# Patient Record
Sex: Female | Born: 1960
Health system: Southern US, Community
[De-identification: ages and names within clinical notes are randomized; demographics above are authoritative.]

## PROBLEM LIST (undated history)

## (undated) DIAGNOSIS — M8580 Other specified disorders of bone density and structure, unspecified site: Secondary | ICD-10-CM

## (undated) DIAGNOSIS — R87619 Unspecified abnormal cytological findings in specimens from cervix uteri: Secondary | ICD-10-CM

## (undated) DIAGNOSIS — F32A Depression, unspecified: Secondary | ICD-10-CM

## (undated) DIAGNOSIS — F329 Major depressive disorder, single episode, unspecified: Secondary | ICD-10-CM

## (undated) DIAGNOSIS — N809 Endometriosis, unspecified: Secondary | ICD-10-CM

## (undated) DIAGNOSIS — R7303 Prediabetes: Secondary | ICD-10-CM

## (undated) DIAGNOSIS — K219 Gastro-esophageal reflux disease without esophagitis: Secondary | ICD-10-CM

## (undated) DIAGNOSIS — T7840XA Allergy, unspecified, initial encounter: Secondary | ICD-10-CM

## (undated) DIAGNOSIS — E785 Hyperlipidemia, unspecified: Secondary | ICD-10-CM

## (undated) DIAGNOSIS — E669 Obesity, unspecified: Secondary | ICD-10-CM

## (undated) DIAGNOSIS — K649 Unspecified hemorrhoids: Secondary | ICD-10-CM

## (undated) DIAGNOSIS — K5909 Other constipation: Secondary | ICD-10-CM

## (undated) DIAGNOSIS — N939 Abnormal uterine and vaginal bleeding, unspecified: Secondary | ICD-10-CM

## (undated) HISTORY — DX: Unspecified hemorrhoids: K64.9

## (undated) HISTORY — DX: Prediabetes: R73.03

## (undated) HISTORY — PX: COLONOSCOPY: SHX174

## (undated) HISTORY — DX: Hyperlipidemia, unspecified: E78.5

## (undated) HISTORY — DX: Other specified disorders of bone density and structure, unspecified site: M85.80

## (undated) HISTORY — DX: Endometriosis, unspecified: N80.9

## (undated) HISTORY — DX: Allergy, unspecified, initial encounter: T78.40XA

## (undated) HISTORY — DX: Other constipation: K59.09

## (undated) HISTORY — DX: Obesity, unspecified: E66.9

## (undated) HISTORY — DX: Depression, unspecified: F32.A

## (undated) HISTORY — DX: Major depressive disorder, single episode, unspecified: F32.9

## (undated) HISTORY — PX: REDUCTION MAMMAPLASTY: SUR839

## (undated) HISTORY — DX: Unspecified abnormal cytological findings in specimens from cervix uteri: R87.619

## (undated) HISTORY — PX: GYNECOLOGIC CRYOSURGERY: SHX857

## (undated) HISTORY — DX: Gastro-esophageal reflux disease without esophagitis: K21.9

## (undated) HISTORY — DX: Abnormal uterine and vaginal bleeding, unspecified: N93.9

---
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
## 2004-01-02 HISTORY — PX: BREAST REDUCTION SURGERY: SHX8

## 2004-01-02 HISTORY — PX: ABDOMINOPLASTY: SHX5355

## 2004-01-02 HISTORY — PX: HYSTEROSCOPY: SHX211

## 2014-01-01 HISTORY — PX: DILATION AND CURETTAGE OF UTERUS: SHX78

## 2014-07-23 ENCOUNTER — Other Ambulatory Visit: Payer: Self-pay | Admitting: Family

## 2014-07-23 DIAGNOSIS — Z1231 Encounter for screening mammogram for malignant neoplasm of breast: Secondary | ICD-10-CM

## 2015-08-27 ENCOUNTER — Other Ambulatory Visit: Payer: Self-pay | Admitting: Obstetrics and Gynecology

## 2015-08-27 DIAGNOSIS — R5381 Other malaise: Secondary | ICD-10-CM

## 2015-09-30 MED FILL — PHENTERMINE 37.5 MG TABLET: 37.5 | 30 days supply | Qty: 30 | Fill #0

## 2015-10-10 ENCOUNTER — Other Ambulatory Visit: Payer: Self-pay | Admitting: Obstetrics and Gynecology

## 2015-10-13 ENCOUNTER — Other Ambulatory Visit: Payer: Self-pay | Admitting: Obstetrics and Gynecology

## 2015-10-13 DIAGNOSIS — Z780 Asymptomatic menopausal state: Secondary | ICD-10-CM

## 2015-10-20 ENCOUNTER — Other Ambulatory Visit: Payer: Self-pay

## 2015-10-24 MED FILL — diazePAM 10 MG TABS: 10 | 1 days supply | Qty: 1 | Fill #0

## 2015-11-02 MED FILL — PHENTERMINE 37.5 MG TABLET: 37.5 | 30 days supply | Qty: 30 | Fill #0

## 2015-11-18 ENCOUNTER — Ambulatory Visit
Admission: RE | Admit: 2015-11-18 | Discharge: 2015-11-18 | Disposition: A | Discharge status: Home / Self Care | Payer: 59 | Source: Ambulatory Visit | Attending: Obstetrics and Gynecology | Admitting: Obstetrics and Gynecology

## 2015-11-18 DIAGNOSIS — M8589 Other specified disorders of bone density and structure, multiple sites: Secondary | ICD-10-CM | POA: Diagnosis not present

## 2015-11-18 DIAGNOSIS — Z780 Asymptomatic menopausal state: Secondary | ICD-10-CM

## 2015-12-05 MED FILL — PHENTERMINE 37.5 MG TABLET: 37.5 | 30 days supply | Qty: 30 | Fill #0

## 2016-01-09 MED FILL — PHENTERMINE 37.5 MG TABLET: 37.5 | 30 days supply | Qty: 30 | Fill #0

## 2016-01-30 MED FILL — TOPIRAMATE 25 MG TABLET: 25 | 30 days supply | Qty: 30 | Fill #0

## 2016-02-09 MED FILL — PHENTERMINE 37.5 MG TABLET: 37.5 | 30 days supply | Qty: 30 | Fill #0

## 2016-02-23 MED FILL — diazePAM 10 MG TABS: 10 | 1 days supply | Qty: 1 | Fill #0

## 2016-03-02 MED FILL — TOPIRAMATE 25 MG TAB: 25 | 30 days supply | Qty: 30 | Fill #0

## 2016-03-08 MED FILL — PHENTERMINE 37.5 MG TABLET: 37.5 | 30 days supply | Qty: 30 | Fill #0

## 2016-04-06 MED FILL — TOPIRAMATE 25 MG TABLET: 25 | 30 days supply | Qty: 30 | Fill #0

## 2016-04-06 MED FILL — PHENTERMINE 37.5 MG TABLET: 37.5 | 30 days supply | Qty: 30 | Fill #0

## 2016-05-10 MED FILL — PHENTERMINE 37.5 MG TABLET: 37.5 | 30 days supply | Qty: 30 | Fill #0

## 2016-05-21 MED FILL — TOPIRAMATE 25 MG TAB: 25 | 30 days supply | Qty: 30 | Fill #0

## 2016-06-06 ENCOUNTER — Ambulatory Visit: Payer: 59 | Admitting: Internal Medicine

## 2016-06-07 ENCOUNTER — Encounter: Payer: Self-pay | Admitting: Family Medicine

## 2016-06-07 ENCOUNTER — Ambulatory Visit (INDEPENDENT_AMBULATORY_CARE_PROVIDER_SITE_OTHER): Payer: 59 | Admitting: Family Medicine

## 2016-06-07 DIAGNOSIS — R059 Cough, unspecified: Secondary | ICD-10-CM

## 2016-06-07 DIAGNOSIS — R05 Cough: Secondary | ICD-10-CM | POA: Diagnosis not present

## 2016-06-07 MED ORDER — BENZONATATE 200 MG PO CAPS: 200.0000 mg | ORAL_CAPSULE | Freq: Three times a day (TID) | ORAL | Status: DC | PRN

## 2016-06-07 MED ORDER — AZITHROMYCIN 250 MG PO TABS: ORAL_TABLET | ORAL | 0 refills | Status: DC

## 2016-06-07 MED FILL — AZITHROMYCIN 250 MG TAB: 250 | 5 days supply | Qty: 6 | Fill #0

## 2016-06-07 NOTE — Patient Instructions (Signed)
Try to get some rest, drink plenty of fluids.  Try tessalon for the cough.  Start zithromax.  Update [place] as needed.  Take care.  Glad to see you.

## 2016-06-07 NOTE — Progress Notes (Signed)
Coughing for about 5 weeks.   Coughing, disrupting sleep.   Rhinorrhea: initially yes, but less now.   Congestion: occ ear pain: no sore throat: no Cough: yes, some yellowish sputum but some better this week but not resolved.  Myalgias: no  Fevers: no Taking mucinex and delsym.   She typically doesn't have troubles this time of year.  Already on OTC allergy meds.    She hasn't tried tessalon yet.    Per HPI unless specifically indicated in ROS section   Meds, vitals, and allergies reviewed.   GEN: nad, alert and oriented HEENT: mucous membranes moist, TM w/o erythema, nasal epithelium injected, OP with cobblestoning, sinuses not ttp NECK: supple w/o LA CV: rrr. PULM: ctab, no inc wob ABD: soft, +bs EXT: no edema

## 2016-06-08 ENCOUNTER — Ambulatory Visit: Payer: 59 | Admitting: Family Medicine

## 2016-06-08 DIAGNOSIS — R059 Cough, unspecified: Secondary | ICD-10-CM | POA: Insufficient documentation

## 2016-06-08 DIAGNOSIS — R05 Cough: Secondary | ICD-10-CM | POA: Insufficient documentation

## 2016-06-08 DIAGNOSIS — Z0289 Encounter for other administrative examinations: Secondary | ICD-10-CM

## 2016-06-08 NOTE — Assessment & Plan Note (Signed)
She's been coughing for about 5 weeks, recently with persistent discolored sputum. Nontoxic. Discussed with patient about options. Lungs are clear. At this point still reasonable to treat with antibiotics given sputum in the duration. Use Tessalon as needed for cough. Update me as needed. Routine cautions given on antibiotics. Update me as needed.

## 2016-06-29 MED FILL — TOPIRAMATE 25 MG TAB: 25 | 30 days supply | Qty: 30 | Fill #0

## 2016-07-09 MED FILL — PHENTERMINE 37.5 MG TABLET: 37.5 | 30 days supply | Qty: 30 | Fill #0

## 2016-08-13 ENCOUNTER — Ambulatory Visit (INDEPENDENT_AMBULATORY_CARE_PROVIDER_SITE_OTHER): Payer: 59 | Admitting: Primary Care

## 2016-08-13 ENCOUNTER — Encounter: Payer: Self-pay | Admitting: Primary Care

## 2016-08-13 VITALS — BP 112/78 | HR 74 | Temp 97.8°F | Ht 62.0 in | Wt 175.0 lb

## 2016-08-13 DIAGNOSIS — K219 Gastro-esophageal reflux disease without esophagitis: Secondary | ICD-10-CM | POA: Diagnosis not present

## 2016-08-13 DIAGNOSIS — R2241 Localized swelling, mass and lump, right lower limb: Secondary | ICD-10-CM

## 2016-08-13 DIAGNOSIS — Z01419 Encounter for gynecological examination (general) (routine) without abnormal findings: Secondary | ICD-10-CM

## 2016-08-13 DIAGNOSIS — E669 Obesity, unspecified: Secondary | ICD-10-CM | POA: Diagnosis not present

## 2016-08-13 DIAGNOSIS — E663 Overweight: Secondary | ICD-10-CM | POA: Insufficient documentation

## 2016-08-13 DIAGNOSIS — R2240 Localized swelling, mass and lump, unspecified lower limb: Secondary | ICD-10-CM | POA: Insufficient documentation

## 2016-08-13 MED FILL — PHENTERMINE 37.5 MG TABLET: 37.5 | 30 days supply | Qty: 30 | Fill #0

## 2016-08-13 MED FILL — TOPIRAMATE 25 MG TAB: 25 | 30 days supply | Qty: 30 | Fill #0

## 2016-08-13 NOTE — Patient Instructions (Signed)
You will be contacted regarding your referral to GYN.  Please let us know if you have not heard back within one week.   Start exercising. You should be getting 150 minutes of moderate intensity exercise weekly.  Ensure you are consuming 64 ounces of water daily.  It was a pleasure to meet you today! Please don't hesitate to call me with any questions. Welcome to [facility]!

## 2016-08-13 NOTE — Progress Notes (Signed)
Subjective:    Patient ID: Tina Chavez, female    DOB: July 30, 1960, 56 y.o.   MRN: [id number]  HPI  Tina Chavez is a 56 year old female who presents today to establish care and discuss the problems mentioned below. Will obtain old records. She would like to establish with a local GYN and is needing a referral.  1) GERD: Currently managed on omeprazole 10 mg. History of duodenal ulcer. Symptoms including esophageal burning and epigastric pain without medication. She has lost 60 pounds over the last 1 year.  2) Obesity: Currently managed on Phentermine and Topamax. Following with [facility]. Has lost 60 pounds in the past one year, no weight loss since November 2017. Recently started Vitamin B 12 injections to help with metabolism. She endorses a healthy diet, does not exercise.  3) Chronic Cough: Present for 6-8 weeks, treated with Azithromycin with improvement, then resolve two weeks later.  4) Foot Mass: Located to right lateral foot that she noticed 6 weeks ago. She denies pain. She has noticed tension to he right lateral ankle as she thinks she's altered her gait. She denies injury/trauma, erythema, other growths.  Review of Systems  Respiratory: Negative for cough and shortness of breath.   Cardiovascular: Negative for chest pain.  Gastrointestinal:       Controlled GERD  Musculoskeletal:       Right foot mass       Past Medical History:  Diagnosis Date  . Allergy   . Depression   . GERD (gastroesophageal reflux disease)   . Hyperlipidemia      Social History   Social History  . Marital status: Married    Spouse name: N/A  . Number of children: N/A  . Years of education: N/A   Occupational History  . Not on file.   Social History Main Topics  . Smoking status: Never Smoker  . Smokeless tobacco: Never Used  . Alcohol use Not on file  . Drug use: Unknown  . Sexual activity: Not on file   Other Topics Concern  . Not on file   Social History Narrative     Married.   Step Children.   Works as a Transport planner.   Enjoys traveling, playing with her dogs.     Past Surgical History:  Procedure Laterality Date  . BREAST REDUCTION SURGERY  2006    Family History  Problem Relation Age of Onset  . Hyperlipidemia Mother   . Stroke Mother   . Arthritis Father   . Heart disease Father   . Hyperlipidemia Maternal Grandmother   . Heart disease Maternal Grandmother   . Mental illness Maternal Grandmother   . Alcohol abuse Maternal Grandfather   . Arthritis Paternal Grandmother   . Hyperlipidemia Paternal Grandmother   . Heart disease Paternal Grandmother   . Alcohol abuse Paternal Grandfather     Allergies  Allergen Reactions  . Sulfa Antibiotics Anaphylaxis  . Pollen Extract     Current Outpatient Prescriptions on File Prior to Visit  Medication Sig Dispense Refill  . aspirin EC 81 MG tablet Take 81 mg by mouth daily.    . Calcium Carbonate-Vitamin D3 (CALCIUM 600-D) 600-400 MG-UNIT TABS Takes 2 tablets daily.    . cetirizine (ZYRTEC) 10 MG tablet Take 10 mg by mouth daily.    . Multiple Vitamin (MULTIVITAMIN) tablet Take 1 tablet by mouth daily.    . Omega-3 Fatty Acids (FISH OIL) 1000 MG CAPS 2 capsules daily.    [name]  omeprazole (PRILOSEC) 10 MG capsule Take 10 mg by mouth daily.    . phentermine 37.5 MG capsule Take 37.5 mg by mouth every morning.    . Probiotic Product (PROBIOTIC ADVANCED PO) Take by mouth daily.    [name] topiramate (TOPAMAX) 25 MG tablet Take 25 mg by mouth daily.    . vitamin B-12 (CYANOCOBALAMIN) 1000 MCG tablet Take 1,000 mcg by mouth daily.     No current facility-administered medications on file prior to visit.     BP 112/78   Pulse 74   Temp 97.8 F (36.6 C) (Oral)   Ht 5\' 2"  (1.575 m)   Wt 175 lb (79.4 kg)   SpO2 99%   BMI 32.01 kg/m    Objective:   Physical Exam  Constitutional: She appears well-nourished.  Neck: Neck supple.  Cardiovascular: Normal rate and regular rhythm.   Pulmonary/Chest:  Effort normal and breath sounds normal.  Musculoskeletal:  Bony-like prominence to right lateral foot, midway between 5th digit and heel. Non tender, immobile, no erythema.  Skin: Skin is warm and dry.  Psychiatric: She has a normal mood and affect.          Assessment & Plan:

## 2016-08-13 NOTE — Assessment & Plan Note (Signed)
Commended her on weight loss over the past 1 year. She will continue to follow with [facility] for Phentermine and Topamax. Recommended regular exercise.

## 2016-08-13 NOTE — Assessment & Plan Note (Signed)
Appears and feels like bony prominence. Offered xray today, she kindly declines and would like to monitor the site. She will update if no improvement or for any changes.

## 2016-08-13 NOTE — Assessment & Plan Note (Signed)
Symptoms well managed on omeprazole, continue 10 mg daily. Commended her on weight loss.

## 2016-08-16 ENCOUNTER — Telehealth: Payer: Self-pay | Admitting: Obstetrics and Gynecology

## 2016-08-16 NOTE — Telephone Encounter (Signed)
Called and left a message for patient to call back to schedule a new patient doctor referral. °

## 2016-09-12 MED FILL — TOPIRAMATE 25 MG TAB: 25 | 30 days supply | Qty: 30 | Fill #0

## 2016-09-12 MED FILL — PHENTERMINE 37.5 MG TABLET: 37.5 | 30 days supply | Qty: 30 | Fill #0

## 2016-10-19 ENCOUNTER — Encounter: Payer: Self-pay | Admitting: Obstetrics & Gynecology

## 2016-10-19 ENCOUNTER — Ambulatory Visit (INDEPENDENT_AMBULATORY_CARE_PROVIDER_SITE_OTHER): Payer: 59 | Admitting: Obstetrics & Gynecology

## 2016-10-19 VITALS — BP 120/66 | HR 98 | Resp 16 | Ht 62.25 in | Wt 173.0 lb

## 2016-10-19 DIAGNOSIS — Z205 Contact with and (suspected) exposure to viral hepatitis: Secondary | ICD-10-CM

## 2016-10-19 DIAGNOSIS — Z01419 Encounter for gynecological examination (general) (routine) without abnormal findings: Secondary | ICD-10-CM | POA: Diagnosis not present

## 2016-10-19 NOTE — Progress Notes (Signed)
56 y.o. [id number] MarriedCaucasianF here for new patient annual exam.  Here for new patient exam.  Working on weight loss.  Considering seeing Dr. Leafy Ro.  Going to [facility].  Working on weight loss.  Has lost about 60 pounds.  On qsymia right now.  Denies vaginal bleeding.  Having some vaginal dryness.  Has a few hot flashes from time to time.  Works at [employer]  Patient's last menstrual period was 01/01/2014 (approximate).          Sexually active: Yes.    The current method of family planning is post menopausal status.    Exercising: No.  The patient does not participate in regular exercise at present. Smoker:  no  Health Maintenance: Pap:  08/06/14 Neg. HR HPV:neg -Care everywhere  04/09/12 Neg  History of abnormal Pap:  Yes, age 56 MMG:  05/07/15 BIRADS1:neg. Care Everywhere  Colonoscopy:  2009 Normal. F/u 10 year  BMD:   11/18/15 Osteopenia  TDaP:  Current  Pneumonia vaccine(s):  N/A Zostavax:   No Hep C testing: unsure  Screening Labs: PCP   reports that she has never smoked. She has never used smokeless tobacco. She reports that she does not drink alcohol or use drugs.  Past Medical History:  Diagnosis Date  . Abnormal Pap smear of cervix    age 39  . Abnormal uterine bleeding   . Allergy   . Depression   . Endometriosis   . GERD (gastroesophageal reflux disease)   . Hemorrhoids   . Hyperlipidemia     Past Surgical History:  Procedure Laterality Date  . BREAST REDUCTION SURGERY  2006  . [place] OF UTERUS  2016  . GYNECOLOGIC CRYOSURGERY     at age 56  . HYSTEROSCOPY  2006    Current Outpatient Prescriptions  Medication Sig Dispense Refill  . aspirin EC 81 MG tablet Take 81 mg by mouth daily.    . Calcium Carbonate-Vitamin D3 (CALCIUM 600-D) 600-400 MG-UNIT TABS Takes 2 tablets daily.    . cetirizine (ZYRTEC) 10 MG tablet Take 10 mg by mouth daily.    Mariane Baumgarten Calcium (STOOL SOFTENER PO) Take by mouth daily as needed.     . Multiple Vitamin (MULTIVITAMIN) tablet Take 1 tablet by mouth daily.    . Omega-3 Fatty Acids (FISH OIL) 1000 MG CAPS 2 capsules daily.    [name] omeprazole (PRILOSEC) 10 MG capsule Take 20 mg by mouth daily.     . phentermine 37.5 MG capsule Take 37.5 mg by mouth every morning.    . Probiotic Product (PROBIOTIC ADVANCED PO) Take by mouth daily.    [name] topiramate (TOPAMAX) 25 MG tablet Take 25 mg by mouth daily.     No current facility-administered medications for this visit.     Family History  Problem Relation Age of Onset  . Hyperlipidemia Mother   . Stroke Mother   . Arthritis Father   . Heart disease Father   . Hyperlipidemia Maternal Grandmother   . Heart disease Maternal Grandmother   . Mental illness Maternal Grandmother   . Alcohol abuse Maternal Grandfather   . Arthritis Paternal Grandmother   . Hyperlipidemia Paternal Grandmother   . Heart disease Paternal Grandmother   . Alcohol abuse Paternal Grandfather     ROS:  Pertinent items are noted in HPI.  Otherwise, a comprehensive ROS was negative.  Exam:   BP 120/66 (BP Location: Right Arm, Patient Position: Sitting, Cuff Size: Normal)  Pulse 98   Resp 16   Ht 5' 2.25" (1.581 m)   Wt 173 lb (78.5 kg)   LMP 01/01/2014 (Approximate)   BMI 31.39 kg/m    Height: 5' 2.25" (158.1 cm)  Ht Readings from Last 3 Encounters:  10/19/16 5' 2.25" (1.581 m)  08/13/16 5\' 2"  (1.575 m)    General appearance: alert, cooperative and appears stated age Head: Normocephalic, without obvious abnormality, atraumatic Neck: no adenopathy, supple, symmetrical, trachea midline and thyroid normal to inspection and palpation Lungs: clear to auscultation bilaterally Breasts: normal appearance, no masses or tenderness Heart: regular rate and rhythm Abdomen: soft, non-tender; bowel sounds normal; no masses,  no organomegaly Extremities: extremities normal, atraumatic, no cyanosis or edema Skin: Skin color, texture, turgor normal. No rashes or  lesions Lymph nodes: Cervical, supraclavicular, and axillary nodes normal. No abnormal inguinal nodes palpated Neurologic: Grossly normal   Pelvic: External genitalia:  no lesions              Urethra:  normal appearing urethra with no masses, tenderness or lesions              Bartholins and Skenes: normal                 Vagina: normal appearing vagina with normal color and discharge, no lesions, atrophic changes              Cervix: no lesions              Pap taken: No. Bimanual Exam:  Uterus:  normal size, contour, position, consistency, mobility, non-tender              Adnexa: normal adnexa and no mass, fullness, tenderness               Rectovaginal: Confirms               Anus:  normal sphincter tone, no lesions  Chaperone was present for exam.  A:  Well Woman with normal exam PMP, no HRT Atrophic vaginal changes H/O GI ulcer, GERD  P:   Mammogram guidelines reviewed.  Information about local mmgs given pap smear not obtained.  Will repeat pap and HR HPV 1 year. Hep C antibody will be obtained today. return annually or prn

## 2016-10-20 LAB — HEPATITIS C ANTIBODY: Hep C Virus Ab: <0.1 s/co ratio (ref 0.0–0.9)

## 2016-11-08 MED FILL — PHENTERMINE 37.5 MG TABLET: 37.5 | 30 days supply | Qty: 30 | Fill #0

## 2016-11-12 MED FILL — diazePAM 10 MG TABS: 10 | 1 days supply | Qty: 1 | Fill #0

## 2017-02-13 ENCOUNTER — Encounter: Payer: Self-pay | Admitting: Primary Care

## 2017-02-13 DIAGNOSIS — E669 Obesity, unspecified: Secondary | ICD-10-CM

## 2017-02-14 ENCOUNTER — Encounter: Payer: Self-pay | Admitting: Primary Care

## 2017-03-21 ENCOUNTER — Encounter (INDEPENDENT_AMBULATORY_CARE_PROVIDER_SITE_OTHER): Payer: Self-pay

## 2017-03-26 ENCOUNTER — Encounter (INDEPENDENT_AMBULATORY_CARE_PROVIDER_SITE_OTHER): Payer: Self-pay | Admitting: Family Medicine

## 2017-03-26 ENCOUNTER — Ambulatory Visit (INDEPENDENT_AMBULATORY_CARE_PROVIDER_SITE_OTHER): Payer: No Typology Code available for payment source | Admitting: Family Medicine

## 2017-03-26 VITALS — BP 124/76 | HR 65 | Temp 97.6°F | Ht 63.0 in | Wt 174.0 lb

## 2017-03-26 DIAGNOSIS — Z0289 Encounter for other administrative examinations: Secondary | ICD-10-CM

## 2017-03-26 DIAGNOSIS — R5383 Other fatigue: Secondary | ICD-10-CM | POA: Diagnosis not present

## 2017-03-26 DIAGNOSIS — Z1331 Encounter for screening for depression: Secondary | ICD-10-CM

## 2017-03-26 DIAGNOSIS — R0600 Dyspnea, unspecified: Secondary | ICD-10-CM

## 2017-03-26 DIAGNOSIS — R0609 Other forms of dyspnea: Secondary | ICD-10-CM

## 2017-03-26 DIAGNOSIS — M8580 Other specified disorders of bone density and structure, unspecified site: Secondary | ICD-10-CM

## 2017-03-26 DIAGNOSIS — M810 Age-related osteoporosis without current pathological fracture: Secondary | ICD-10-CM | POA: Diagnosis not present

## 2017-03-26 DIAGNOSIS — Z9189 Other specified personal risk factors, not elsewhere classified: Secondary | ICD-10-CM | POA: Diagnosis not present

## 2017-03-26 DIAGNOSIS — E669 Obesity, unspecified: Secondary | ICD-10-CM

## 2017-03-26 DIAGNOSIS — Z6830 Body mass index (BMI) 30.0-30.9, adult: Secondary | ICD-10-CM

## 2017-03-26 NOTE — Progress Notes (Signed)
.  Office: [phone]  /  Fax: [phone]   HPI:   Chief Complaint: OBESITY  Tina Chavez (MR# [id number]) is a 57 y.o. female who presents on 03/26/2017 for obesity evaluation and treatment. Current BMI is Body mass index is 30.82 kg/m.Tina Chavez has struggled with obesity for years and has been unsuccessful in either losing weight or maintaining long term weight loss. Tina Chavez attended our information session and states she is currently in the action stage of change and ready to dedicate time achieving and maintaining a healthier weight.  Tina Chavez states her family eats meals together she thinks her family will eat healthier with  her her desired weight loss is 35 to 45 lbs she has been heavy most of  her life her heaviest weight ever was 228 lbs. she skips meals frequently she frequently eats larger portions than normal  she has binge eating behaviors she struggles with emotional eating    Fatigue Tina Chavez feels her energy is lower than it should be. This has worsened with weight gain and has not worsened recently. Tina Chavez admits to daytime somnolence and admits to waking up still tired. Patient is at risk for obstructive sleep apnea. Patent has a history of symptoms of daytime fatigue and morning fatigue. Patient generally gets 6 1/2 hours of sleep per night, and states they generally have restful sleep.Tina Chavez is not getting enough sleep at night. Snoring is present. Apneic episodes are not present. Epworth Sleepiness Score is 7  EKG was ordered today and was within normal limits.  Dyspnea on exertion Tina Chavez notes increasing shortness of breath with exercising and seems to be worsening over time with weight gain. She notes getting out of breath sooner with activity than she used to. This has not gotten worse recently. EKG was ordered today and was within normal limits. Tina Chavez denies orthopnea.  Osteopenia Tina Chavez has a diagnosis of osteopenia and is currently taking vitamin D supplement (as well as  calcium).  At risk for osteoporosis Tina Chavez is at higher risk of osteoporosis due to osteopenia.   Depression Screen Tina Chavez (modified PHQ-9) score was  Depression screen PHQ 2/9 03/26/2017  Decreased Interest 1  Down, Depressed, Hopeless 0  PHQ - 2 Score 1  Altered sleeping 1  Tired, decreased energy 1  Change in appetite 1  Feeling bad or failure about yourself  0  Trouble concentrating 0  Moving slowly or fidgety/restless 0  Suicidal thoughts 0  PHQ-9 Score 4  Difficult doing work/chores Not difficult at all    ALLERGIES: Allergies  Allergen Reactions  . Sulfa Antibiotics Anaphylaxis  . Pollen Extract     MEDICATIONS: Current Outpatient Medications on File Prior to Visit  Medication Sig Dispense Refill  . aspirin EC 81 MG tablet Take 81 mg by mouth daily.    . Calcium Carbonate-Vitamin D3 (CALCIUM 600-D) 600-400 MG-UNIT TABS Takes 2 tablets daily.    Mariane Baumgarten Calcium (STOOL SOFTENER PO) Take by mouth daily as needed.    . loratadine (CLARITIN) 10 MG tablet Take 10 mg by mouth daily.    . Multiple Vitamin (MULTIVITAMIN) tablet Take 1 tablet by mouth daily.    . Omega-3 Fatty Acids (FISH OIL) 1000 MG CAPS 2 capsules daily.    Tina Chavez Kitchen omeprazole (PRILOSEC) 10 MG capsule Take 20 mg by mouth daily.     . Probiotic Product (PROBIOTIC ADVANCED PO) Take by mouth daily.     No current facility-administered medications on file prior to visit.  PAST MEDICAL HISTORY: Past Medical History:  Diagnosis Date  . Abnormal Pap smear of cervix    age 55  . Abnormal uterine bleeding   . Allergy   . Chronic constipation   . Depression   . Endometriosis   . GERD (gastroesophageal reflux disease)   . Hemorrhoids   . Hyperlipidemia   . Obesity   . Osteopenia     PAST SURGICAL HISTORY: Past Surgical History:  Procedure Laterality Date  . ABDOMINOPLASTY  2006   Dr. Lesli Chavez  . ABDOMINOPLASTY  2006  . BREAST REDUCTION SURGERY  2006  . [place] OF  UTERUS  2016  . FRACTURE SURGERY  1972   Arm   . GYNECOLOGIC CRYOSURGERY     at age 1  . HYSTEROSCOPY  2006    SOCIAL HISTORY: Social History   Tobacco Use  . Smoking status: Never Smoker  . Smokeless tobacco: Never Used  Substance Use Topics  . Alcohol use: No  . Drug use: No    FAMILY HISTORY: Family History  Problem Relation Age of Onset  . Hyperlipidemia Mother   . Stroke Mother   . Diabetes Mother   . Hypertension Mother   . Kidney disease Mother   . Thyroid disease Mother   . Eating disorder Mother   . Obesity Mother   . Arthritis Father   . Heart disease Father   . Sudden death Father   . Hyperlipidemia Maternal Grandmother   . Heart disease Maternal Grandmother   . Mental illness Maternal Grandmother   . Alcohol abuse Maternal Grandfather   . Arthritis Paternal Grandmother   . Hyperlipidemia Paternal Grandmother   . Heart disease Paternal Grandmother   . Alcohol abuse Paternal Grandfather     ROS: Review of Systems  Constitutional: Positive for malaise/fatigue.  HENT:       Hay Fever  Eyes:       Wear Glasses or Contacts  Respiratory: Positive for shortness of breath (on exertion).   Cardiovascular: Negative for orthopnea.       Very Cold Feet or Hands  Gastrointestinal: Positive for constipation.  Psychiatric/Behavioral:       Stress     PHYSICAL EXAM: Blood pressure 124/76, pulse 65, temperature 97.6 F (36.4 C), temperature source Oral, height 5\' 3"  (1.6 m), weight 174 lb (78.9 kg), last menstrual period 01/01/2014, SpO2 97 %. Body mass index is 30.82 kg/m. Physical Exam  Constitutional: She is oriented to person, place, and time. She appears well-developed and well-nourished.  HENT:  Head: Normocephalic and atraumatic.  Nose: Nose normal.  Eyes: EOM are normal. No scleral icterus.  Neck: Normal range of motion. Neck supple. No thyromegaly present.  Cardiovascular: Normal rate and regular rhythm.  Pulmonary/Chest: Effort normal. No  respiratory distress.  Abdominal: Soft. There is no tenderness.  + obesity  Musculoskeletal: Normal range of motion.  Range of Motion normal in all 4 extremities  Neurological: She is alert and oriented to person, place, and time. Coordination normal.  Skin: Skin is warm and dry.  Psychiatric: She has a normal Chavez and affect. Her behavior is normal.  Vitals reviewed.   RECENT LABS AND TESTS: BMET No results found for: NA, K, CL, CO2, GLUCOSE, BUN, CREATININE, CALCIUM, GFRNONAA, GFRAA No results found for: HGBA1C No results found for: INSULIN CBC No results found for: WBC, RBC, HGB, HCT, PLT, MCV, MCH, MCHC, RDW, LYMPHSABS, MONOABS, EOSABS, BASOSABS Iron/TIBC/Ferritin/ %Sat No results found for: IRON, TIBC, FERRITIN, IRONPCTSAT Lipid  Panel  No results found for: CHOL, TRIG, HDL, CHOLHDL, VLDL, LDLCALC, LDLDIRECT Hepatic Function Panel  No results found for: PROT, ALBUMIN, AST, ALT, ALKPHOS, BILITOT, BILIDIR, IBILI No results found for: TSH Vitamin D There are no recent results  ECG  shows NSR with a rate of 75 BPM INDIRECT CALORIMETER done today shows a VO2 of 209 and a REE of 1456. Her calculated basal metabolic rate is 2952 thus her basal metabolic rate is worse than expected.    ASSESSMENT AND PLAN: Other fatigue - Plan: EKG 12-Lead, CBC With Differential, Comprehensive metabolic panel, Hemoglobin A1c, Insulin, random, Lipid Panel With LDL/HDL Ratio, Vitamin B12, Folate, TSH, T4, free, T3  Dyspnea on exertion  Osteopenia after menopause - Plan: VITAMIN D 25 Hydroxy (Vit-D Deficiency, Fractures)  Depression screening  At risk for osteoporosis  Class 1 obesity without serious comorbidity with body mass index (BMI) of 30.0 to 30.9 in adult, unspecified obesity type  PLAN:  Fatigue Sharne was informed that her fatigue may be related to obesity, depression or many other causes. Labs will be ordered, and in the meanwhile Travis has agreed to work on diet, exercise and  weight loss to help with fatigue. Proper sleep hygiene was discussed including the need for 7-8 hours of quality sleep each night. A sleep study was not ordered based on symptoms and Epworth score. We will order indirect calorimetry.  Dyspnea on exertion Liley's shortness of breath appears to be obesity related and exercise induced. She has agreed to work on weight loss and gradually increase exercise to treat her exercise induced shortness of breath. If Seema follows our instructions and loses weight without improvement of her shortness of breath, we will plan to refer to pulmonology. We will order indirect calorimetry and labs. We will monitor this condition regularly. Ethie agrees to this plan.  Osteopenia Hetal will continue her vitamin D supplement (with calcium). We will check vitamin D level and Roxie agreed to follow up with our clinic in 2 weeks.  At risk for osteoporosis Bina is at risk for osteoporosis due to osteopenia. She was encouraged to take her vitamin D and follow her higher calcium diet and increase strengthening exercise to help strengthen her bones and decrease her risk of osteoporosis.  Depression Screen Angelique had a negative depression screening. Depression is commonly associated with obesity and often results in emotional eating behaviors. We will monitor this closely and work on CBT to help improve the non-hunger eating patterns. Referral to Psychology may be required if no improvement is seen as she continues in our clinic.  Obesity Shanteria is currently in the action stage of change and her goal is to continue with weight loss efforts She has agreed to follow the Category 2 plan Natarsha has been instructed to work up to a goal of 150 minutes of combined cardio and strengthening exercise per week for weight loss and overall health benefits. We discussed the following Behavioral Modification Strategies today: increase H2O intake, no skipping meals, increasing lean protein  intake and work on meal planning and easy cooking plans  Makynlee has agreed to follow up with our clinic in 2 weeks. She was informed of the importance of frequent follow up visits to maximize her success with intensive lifestyle modifications for her multiple health conditions. She was informed we would discuss her lab results at her next visit unless there is a critical issue that needs to be addressed sooner. Louvina agreed to keep her next visit at the agreed  upon time to discuss these results.    OBESITY BEHAVIORAL INTERVENTION VISIT  Today's visit was # 1 out of 22.  Starting weight: 174 lbs Starting date: 03/26/17 Today's weight : 174 lbs  Today's date: 03/26/2017 Total lbs lost to date: 0 (Patients must lose 7 lbs in the first 6 months to continue with counseling)   ASK: We discussed the diagnosis of obesity with Martyna Hirschman today and Lorette agreed to give [place] permission to discuss obesity behavioral modification therapy today.  ASSESS: Shakeisha has the diagnosis of obesity and her BMI today is 30.83 Precious is in the action stage of change   ADVISE: Jacole was educated on the multiple health risks of obesity as well as the benefit of weight loss to improve her health. She was advised of the need for long term treatment and the importance of lifestyle modifications.  AGREE: Multiple dietary modification options and treatment options were discussed and  Maelys agreed to the above obesity treatment plan.   I, Doreene Nest, am acting as transcriptionist for Eber Jones, MD   I have reviewed the above documentation for accuracy and completeness, and I agree with the above. - Ilene Qua, MD

## 2017-03-27 LAB — CBC WITH DIFFERENTIAL
Basophils Absolute: 0 10*3/uL (ref 0.0–0.2)
Basos: 1 %
EOS (ABSOLUTE): 0.2 10*3/uL (ref 0.0–0.4)
EOS: 3 %
HEMATOCRIT: 44.6 % (ref 34.0–46.6)
HEMOGLOBIN: 14.6 g/dL (ref 11.1–15.9)
Immature Grans (Abs): 0 10*3/uL (ref 0.0–0.1)
Immature Granulocytes: 0 %
LYMPHS ABS: 2.7 10*3/uL (ref 0.7–3.1)
Lymphs: 37 %
MCH: 29.9 pg (ref 26.6–33.0)
MCHC: 32.7 g/dL (ref 31.5–35.7)
MCV: 91 fL (ref 79–97)
MONOCYTES: 5 %
Monocytes Absolute: 0.4 10*3/uL (ref 0.1–0.9)
NEUTROS ABS: 4 10*3/uL (ref 1.4–7.0)
Neutrophils: 54 %
RBC: 4.88 x10E6/uL (ref 3.77–5.28)
RDW: 13.2 % (ref 12.3–15.4)
WBC: 7.3 10*3/uL (ref 3.4–10.8)

## 2017-03-27 LAB — LIPID PANEL WITH LDL/HDL RATIO
Cholesterol, Total: 209 mg/dL — ABNORMAL HIGH (ref 100–199)
HDL: 74 mg/dL (ref >39)
LDL CALC: 118 mg/dL — AB (ref 0–99)
LDL/HDL RATIO: 1.6 ratio (ref 0.0–3.2)
TRIGLYCERIDES: 83 mg/dL (ref 0–149)
VLDL CHOLESTEROL CAL: 17 mg/dL (ref 5–40)

## 2017-03-27 LAB — COMPREHENSIVE METABOLIC PANEL
ALBUMIN: 4.7 g/dL (ref 3.5–5.5)
ALK PHOS: 92 IU/L (ref 39–117)
ALT: 19 IU/L (ref 0–32)
AST: 16 IU/L (ref 0–40)
Albumin/Globulin Ratio: 2.1 (ref 1.2–2.2)
BUN/Creatinine Ratio: 28 — ABNORMAL HIGH (ref 9–23)
BUN: 20 mg/dL (ref 6–24)
Bilirubin Total: 0.4 mg/dL (ref 0.0–1.2)
CO2: 24 mmol/L (ref 20–29)
CREATININE: 0.71 mg/dL (ref 0.57–1.00)
Calcium: 9.3 mg/dL (ref 8.7–10.2)
Chloride: 102 mmol/L (ref 96–106)
GFR calc non Af Amer: 94 mL/min/{1.73_m2} (ref >59)
GFR, EST AFRICAN AMERICAN: 109 mL/min/{1.73_m2} (ref >59)
Globulin, Total: 2.2 g/dL (ref 1.5–4.5)
Glucose: 104 mg/dL — ABNORMAL HIGH (ref 65–99)
Potassium: 4.7 mmol/L (ref 3.5–5.2)
SODIUM: 140 mmol/L (ref 134–144)
Total Protein: 6.9 g/dL (ref 6.0–8.5)

## 2017-03-27 LAB — VITAMIN D 25 HYDROXY (VIT D DEFICIENCY, FRACTURES): Vit D, 25-Hydroxy: 31.7 ng/mL (ref 30.0–100.0)

## 2017-03-27 LAB — TSH: TSH: 2.92 u[IU]/mL (ref 0.450–4.500)

## 2017-03-27 LAB — VITAMIN B12: Vitamin B-12: 1867 pg/mL — ABNORMAL HIGH (ref 232–1245)

## 2017-03-27 LAB — HEMOGLOBIN A1C
Est. average glucose Bld gHb Est-mCnc: 117 mg/dL
HEMOGLOBIN A1C: 5.7 % — AB (ref 4.8–5.6)

## 2017-03-27 LAB — T3: T3, Total: 95 ng/dL (ref 71–180)

## 2017-03-27 LAB — FOLATE

## 2017-03-27 LAB — T4, FREE: Free T4: 1.01 ng/dL (ref 0.82–1.77)

## 2017-03-27 LAB — INSULIN, RANDOM: INSULIN: 7.1 u[IU]/mL (ref 2.6–24.9)

## 2017-04-09 ENCOUNTER — Ambulatory Visit (INDEPENDENT_AMBULATORY_CARE_PROVIDER_SITE_OTHER): Payer: No Typology Code available for payment source | Admitting: Family Medicine

## 2017-04-09 VITALS — BP 116/79 | HR 71 | Temp 98.2°F | Ht 63.0 in | Wt 174.0 lb

## 2017-04-09 DIAGNOSIS — E559 Vitamin D deficiency, unspecified: Secondary | ICD-10-CM

## 2017-04-09 DIAGNOSIS — Z6830 Body mass index (BMI) 30.0-30.9, adult: Secondary | ICD-10-CM

## 2017-04-09 DIAGNOSIS — E669 Obesity, unspecified: Secondary | ICD-10-CM

## 2017-04-09 DIAGNOSIS — Z9189 Other specified personal risk factors, not elsewhere classified: Secondary | ICD-10-CM | POA: Diagnosis not present

## 2017-04-09 DIAGNOSIS — R7303 Prediabetes: Secondary | ICD-10-CM

## 2017-04-09 MED ORDER — VITAMIN D (ERGOCALCIFEROL) 1.25 MG (50000 UNIT) PO CAPS: 50000.0000 [IU] | ORAL_CAPSULE | ORAL | 0 refills | Status: DC

## 2017-04-09 MED FILL — VIT D2 1.25 MG (50,000 UNIT: 1.25 MG | 28 days supply | Qty: 4 | Fill #0

## 2017-04-10 NOTE — Progress Notes (Signed)
Office: [phone]  /  Fax: [phone]   HPI:   Chief Complaint: OBESITY Tina Chavez is here to discuss her progress with her obesity treatment plan. She is on the Category 2 plan and is following her eating plan approximately 99 % of the time. She states she is walking for 30 minutes 2 times per week. [place] found she was hungry during the day. She rented her house out for furniture market but brought her food with her.  Her weight is 174 lb (78.9 kg) today and has not lost weight since her last visit. She has lost 0 lbs since starting treatment with [place].  Vitamin D Deficiency Tina Chavez has a diagnosis of vitamin D deficiency. She is not on Vit D supplementation currently and she denies nausea, vomiting or muscle weakness.  Pre-Diabetes Tina Chavez has a diagnosis of pre-diabetes based on her elevated Hgb A1c and was informed this puts her at greater risk of developing diabetes. Hgb A1c of 5.7, insulin of 7.1, no previous labs to compare. She is not taking metformin currently and continues to work on diet and exercise to decrease risk of diabetes. She denies nausea or hypoglycemia.  At risk for diabetes Tina Chavez is at higher than average risk for developing diabetes due to her obesity and pre-diabetes. She currently denies polyuria or polydipsia.  ALLERGIES: Allergies  Allergen Reactions  . Sulfa Antibiotics Anaphylaxis  . Pollen Extract     MEDICATIONS: Current Outpatient Medications on File Prior to Visit  Medication Sig Dispense Refill  . aspirin EC 81 MG tablet Take 81 mg by mouth daily.    . Calcium Carbonate-Vitamin D3 (CALCIUM 600-D) 600-400 MG-UNIT TABS Takes 2 tablets daily.    Mariane Baumgarten Calcium (STOOL SOFTENER PO) Take by mouth daily as needed.    . loratadine (CLARITIN) 10 MG tablet Take 10 mg by mouth daily.    . Multiple Vitamin (MULTIVITAMIN) tablet Take 1 tablet by mouth daily.    . Omega-3 Fatty Acids (FISH OIL) 1000 MG CAPS 2 capsules daily.    [name] omeprazole (PRILOSEC) 10 MG  capsule Take 20 mg by mouth daily.     . Probiotic Product (PROBIOTIC ADVANCED PO) Take by mouth daily.     No current facility-administered medications on file prior to visit.     PAST MEDICAL HISTORY: Past Medical History:  Diagnosis Date  . Abnormal Pap smear of cervix    age 19  . Abnormal uterine bleeding   . Allergy   . Chronic constipation   . Depression   . Endometriosis   . GERD (gastroesophageal reflux disease)   . Hemorrhoids   . Hyperlipidemia   . Obesity   . Osteopenia     PAST SURGICAL HISTORY: Past Surgical History:  Procedure Laterality Date  . ABDOMINOPLASTY  2006   Dr. Lesli Albee  . ABDOMINOPLASTY  2006  . BREAST REDUCTION SURGERY  2006  . [place] OF UTERUS  2016  . FRACTURE SURGERY  1972   Arm   . GYNECOLOGIC CRYOSURGERY     at age 46  . HYSTEROSCOPY  2006    SOCIAL HISTORY: Social History   Tobacco Use  . Smoking status: Never Smoker  . Smokeless tobacco: Never Used  Substance Use Topics  . Alcohol use: No  . Drug use: No    FAMILY HISTORY: Family History  Problem Relation Age of Onset  . Hyperlipidemia Mother   . Stroke Mother   . Diabetes Mother   . Hypertension Mother   .  Kidney disease Mother   . Thyroid disease Mother   . Eating disorder Mother   . Obesity Mother   . Arthritis Father   . Heart disease Father   . Sudden death Father   . Hyperlipidemia Maternal Grandmother   . Heart disease Maternal Grandmother   . Mental illness Maternal Grandmother   . Alcohol abuse Maternal Grandfather   . Arthritis Paternal Grandmother   . Hyperlipidemia Paternal Grandmother   . Heart disease Paternal Grandmother   . Alcohol abuse Paternal Grandfather     ROS: Review of Systems  Constitutional: Negative for weight loss.  Gastrointestinal: Negative for nausea and vomiting.  Genitourinary: Negative for frequency.  Musculoskeletal:       Negative muscle weakness  Endo/Heme/Allergies: Negative for polydipsia.        Negative hypoglycemia    PHYSICAL EXAM: Blood pressure 116/79, pulse 71, temperature 98.2 F (36.8 C), temperature source Oral, height 5\' 3"  (1.6 m), weight 174 lb (78.9 kg), last menstrual period 01/01/2014, SpO2 98 %. Body mass index is 30.82 kg/m. Physical Exam  Constitutional: She is oriented to person, place, and time. She appears well-developed and well-nourished.  Cardiovascular: Normal rate.  Pulmonary/Chest: Effort normal.  Musculoskeletal: Normal range of motion.  Neurological: She is oriented to person, place, and time.  Skin: Skin is warm and dry.  Psychiatric: She has a normal mood and affect. Her behavior is normal.  Vitals reviewed.   RECENT LABS AND TESTS: BMET    Component Value Date/Time   NA 140 03/26/2017 1157   K 4.7 03/26/2017 1157   CL 102 03/26/2017 1157   CO2 24 03/26/2017 1157   GLUCOSE 104 (H) 03/26/2017 1157   BUN 20 03/26/2017 1157   CREATININE 0.71 03/26/2017 1157   CALCIUM 9.3 03/26/2017 1157   GFRNONAA 94 03/26/2017 1157   GFRAA 109 03/26/2017 1157   Lab Results  Component Value Date   HGBA1C 5.7 (H) 03/26/2017   Lab Results  Component Value Date   INSULIN 7.1 03/26/2017   CBC    Component Value Date/Time   WBC 7.3 03/26/2017 1157   RBC 4.88 03/26/2017 1157   HGB 14.6 03/26/2017 1157   HCT 44.6 03/26/2017 1157   MCV 91 03/26/2017 1157   MCH 29.9 03/26/2017 1157   MCHC 32.7 03/26/2017 1157   RDW 13.2 03/26/2017 1157   LYMPHSABS 2.7 03/26/2017 1157   EOSABS 0.2 03/26/2017 1157   BASOSABS 0.0 03/26/2017 1157   Iron/TIBC/Ferritin/ %Sat No results found for: IRON, TIBC, FERRITIN, IRONPCTSAT Lipid Panel     Component Value Date/Time   CHOL 209 (H) 03/26/2017 1157   TRIG 83 03/26/2017 1157   HDL 74 03/26/2017 1157   LDLCALC 118 (H) 03/26/2017 1157   Hepatic Function Panel     Component Value Date/Time   PROT 6.9 03/26/2017 1157   ALBUMIN 4.7 03/26/2017 1157   AST 16 03/26/2017 1157   ALT 19 03/26/2017 1157   ALKPHOS 92  03/26/2017 1157   BILITOT 0.4 03/26/2017 1157      Component Value Date/Time   TSH 2.920 03/26/2017 1157  Results for Edberg, Orra (MRN [id number]) as of 04/10/2017 10:03  Ref. Range 03/26/2017 11:57  Vitamin D, 25-Hydroxy Latest Ref Range: 30.0 - 100.0 ng/mL 31.7    ASSESSMENT AND PLAN: Vitamin D deficiency - Plan: Vitamin D, Ergocalciferol, (DRISDOL) 50000 units CAPS capsule  Pre-diabetes  At risk for diabetes mellitus  Class 1 obesity without serious comorbidity with body mass index (BMI) of  30.0 to 30.9 in adult, unspecified obesity type  PLAN:  Vitamin D Deficiency [place] was informed that low vitamin D levels contributes to fatigue and are associated with obesity, breast, and colon cancer. Tina Chavez agrees to start prescription Vit D @50 ,000 IU every week #4 with no refills. She will follow up for routine testing of vitamin D, at least 2-3 times per year. She was informed of the risk of over-replacement of vitamin D and agrees to not increase her dose unless she discusses this with [place] first. Tina Chavez agrees to follow up with our clinic in 2 weeks.  Pre-Diabetes Tina Chavez will continue to work on weight loss, exercise, and decreasing simple carbohydrates in her diet to help decrease the risk of diabetes. We dicussed metformin including benefits and risks. She was informed that eating too many simple carbohydrates or too many calories at one sitting increases the likelihood of GI side effects. Tina Chavez declined metformin for now and a prescription was not written today. We will recheck labs and Tina Chavez agrees to follow up with our clinic in 2 weeks as directed to monitor her progress.  Diabetes risk counselling Tina Chavez was given extended (30 minutes) diabetes prevention counseling today. She is 57 y.o. female and has risk factors for diabetes including obesity and pre-diabetes. We discussed intensive lifestyle modifications today with an emphasis on weight loss as well as increasing exercise and  decreasing simple carbohydrates in her diet.  Obesity Tina Chavez is currently in the action stage of change. As such, her goal is to continue with weight loss efforts She has agreed to follow the Category 2 plan Tina Chavez has been instructed to work up to a goal of 150 minutes of combined cardio and strengthening exercise per week for weight loss and overall health benefits. We discussed the following Behavioral Modification Strategies today: increasing lean protein intake, increasing vegetables, increase H20 intake, work on meal planning and easy cooking plans, and planning for success   Tina Chavez has agreed to follow up with our clinic in 2 weeks. She was informed of the importance of frequent follow up visits to maximize her success with intensive lifestyle modifications for her multiple health conditions.   OBESITY BEHAVIORAL INTERVENTION VISIT  Today's visit was # 2 out of 22.  Starting weight: 174 lbs Starting date: 03/26/17 Today's weight : 174 lbs  Today's date: 04/09/2017 Total lbs lost to date: 0 (Patients must lose 7 lbs in the first 6 months to continue with counseling)   ASK: We discussed the diagnosis of obesity with Tina Chavez today and Tina Chavez agreed to give [place] permission to discuss obesity behavioral modification therapy today.  ASSESS: Tina Chavez has the diagnosis of obesity and her BMI today is 30.83 Tina Chavez is in the action stage of change   ADVISE: Tina Chavez was educated on the multiple health risks of obesity as well as the benefit of weight loss to improve her health. She was advised of the need for long term treatment and the importance of lifestyle modifications.  AGREE: Multiple dietary modification options and treatment options were discussed and  Tina Chavez agreed to the above obesity treatment plan.  I, Tina Chavez, am acting as transcriptionist for Ilene Qua, MD  I have reviewed the above documentation for accuracy and completeness, and I agree with the above. -  Ilene Qua, MD

## 2017-04-25 ENCOUNTER — Ambulatory Visit (INDEPENDENT_AMBULATORY_CARE_PROVIDER_SITE_OTHER): Payer: No Typology Code available for payment source | Admitting: Family Medicine

## 2017-04-25 VITALS — BP 117/79 | HR 69 | Temp 97.9°F | Ht 63.0 in | Wt 174.0 lb

## 2017-04-25 DIAGNOSIS — Z6830 Body mass index (BMI) 30.0-30.9, adult: Secondary | ICD-10-CM

## 2017-04-25 DIAGNOSIS — E669 Obesity, unspecified: Secondary | ICD-10-CM

## 2017-04-25 DIAGNOSIS — R7303 Prediabetes: Secondary | ICD-10-CM | POA: Diagnosis not present

## 2017-04-25 DIAGNOSIS — Z9189 Other specified personal risk factors, not elsewhere classified: Secondary | ICD-10-CM

## 2017-04-25 DIAGNOSIS — E559 Vitamin D deficiency, unspecified: Secondary | ICD-10-CM

## 2017-04-25 DIAGNOSIS — E66811 Obesity, class 1: Secondary | ICD-10-CM

## 2017-04-25 MED ORDER — METFORMIN HCL 500 MG PO TABS: 500.0000 mg | ORAL_TABLET | Freq: Every day | ORAL | 0 refills | Status: DC

## 2017-04-25 MED FILL — metFORMIN HCL 500 MG TABS: 500 | 30 days supply | Qty: 30 | Fill #0

## 2017-04-25 NOTE — Progress Notes (Signed)
Office: [phone]  /  Fax: [phone]   HPI:   Chief Complaint: OBESITY Tina Chavez is here to discuss her progress with her obesity treatment plan. She is on the Category 2 plan and is following her eating plan approximately 90 to 95 % of the time. She states she is exercising 0 minutes 0 times per week. Tina Chavez is feeling frustrated that she has not lost weight. She is following the meal plan fairly strictly. Her weight is 174 lb (78.9 kg) today and has maintained weight over a period of 2 weeks since her last visit. She has lost 0 lbs since starting treatment with [place].  Vitamin D deficiency Tina Chavez has a diagnosis of vitamin D deficiency. She is currently taking vit D and denies nausea, vomiting or muscle weakness.  Pre-Diabetes Tina Chavez has a diagnosis of pre-diabetes based on her elevated Hgb A1c and was informed this puts her at greater risk of developing diabetes. She is not taking metformin currently and continues to work on diet and exercise to decrease risk of diabetes. She admits hunger and denies nausea or hypoglycemia.  At risk for diabetes Tina Chavez is at higher than average risk for developing diabetes due to her obesity and pre-diabetes. She currently denies polyuria or polydipsia.  ALLERGIES: Allergies  Allergen Reactions  . Sulfa Antibiotics Anaphylaxis  . Pollen Extract     MEDICATIONS: Current Outpatient Medications on File Prior to Visit  Medication Sig Dispense Refill  . aspirin EC 81 MG tablet Take 81 mg by mouth daily.    . Calcium Carbonate-Vitamin D3 (CALCIUM 600-D) 600-400 MG-UNIT TABS Takes 2 tablets daily.    Tina Chavez Calcium (STOOL SOFTENER PO) Take by mouth daily as needed.    . loratadine (CLARITIN) 10 MG tablet Take 10 mg by mouth daily.    . Multiple Vitamin (MULTIVITAMIN) tablet Take 1 tablet by mouth daily.    . Omega-3 Fatty Acids (FISH OIL) 1000 MG CAPS 2 capsules daily.    [name] omeprazole (PRILOSEC) 10 MG capsule Take 20 mg by mouth daily.     .  polyethylene glycol powder (GLYCOLAX/MIRALAX) powder Take 1 Container by mouth daily.    . Probiotic Product (PROBIOTIC ADVANCED PO) Take by mouth daily.    . Vitamin D, Ergocalciferol, (DRISDOL) 50000 units CAPS capsule Take 1 capsule (50,000 Units total) by mouth every 7 (seven) days. 4 capsule 0   No current facility-administered medications on file prior to visit.     PAST MEDICAL HISTORY: Past Medical History:  Diagnosis Date  . Abnormal Pap smear of cervix    age 38  . Abnormal uterine bleeding   . Allergy   . Chronic constipation   . Depression   . Endometriosis   . GERD (gastroesophageal reflux disease)   . Hemorrhoids   . Hyperlipidemia   . Obesity   . Osteopenia     PAST SURGICAL HISTORY: Past Surgical History:  Procedure Laterality Date  . ABDOMINOPLASTY  2006   Dr. Lesli Albee  . ABDOMINOPLASTY  2006  . BREAST REDUCTION SURGERY  2006  . [place] OF UTERUS  2016  . FRACTURE SURGERY  1972   Arm   . GYNECOLOGIC CRYOSURGERY     at age 22  . HYSTEROSCOPY  2006    SOCIAL HISTORY: Social History   Tobacco Use  . Smoking status: Never Smoker  . Smokeless tobacco: Never Used  Substance Use Topics  . Alcohol use: No  . Drug use: No    FAMILY  HISTORY: Family History  Problem Relation Age of Onset  . Hyperlipidemia Mother   . Stroke Mother   . Diabetes Mother   . Hypertension Mother   . Kidney disease Mother   . Thyroid disease Mother   . Eating disorder Mother   . Obesity Mother   . Arthritis Father   . Heart disease Father   . Sudden death Father   . Hyperlipidemia Maternal Grandmother   . Heart disease Maternal Grandmother   . Mental illness Maternal Grandmother   . Alcohol abuse Maternal Grandfather   . Arthritis Paternal Grandmother   . Hyperlipidemia Paternal Grandmother   . Heart disease Paternal Grandmother   . Alcohol abuse Paternal Grandfather     ROS: Review of Systems  Constitutional: Negative for weight loss.    Gastrointestinal: Negative for nausea and vomiting.  Genitourinary: Negative for frequency.  Musculoskeletal:       Negative for muscle weakness  Endo/Heme/Allergies: Negative for polydipsia.       Positive for hunger Negative for hypoglycemia    PHYSICAL EXAM: Blood pressure 117/79, pulse 69, temperature 97.9 F (36.6 C), temperature source Oral, height 5\' 3"  (1.6 m), weight 174 lb (78.9 kg), last menstrual period 01/01/2014, SpO2 99 %. Body mass index is 30.82 kg/m. Physical Exam  Constitutional: She is oriented to person, place, and time. She appears well-developed and well-nourished.  Cardiovascular: Normal rate.  Pulmonary/Chest: Effort normal.  Musculoskeletal: Normal range of motion.  Neurological: She is oriented to person, place, and time.  Skin: Skin is warm and dry.  Psychiatric: She has a normal mood and affect. Her behavior is normal.  Vitals reviewed.   RECENT LABS AND TESTS: BMET    Component Value Date/Time   NA 140 03/26/2017 1157   K 4.7 03/26/2017 1157   CL 102 03/26/2017 1157   CO2 24 03/26/2017 1157   GLUCOSE 104 (H) 03/26/2017 1157   BUN 20 03/26/2017 1157   CREATININE 0.71 03/26/2017 1157   CALCIUM 9.3 03/26/2017 1157   GFRNONAA 94 03/26/2017 1157   GFRAA 109 03/26/2017 1157   Lab Results  Component Value Date   HGBA1C 5.7 (H) 03/26/2017   Lab Results  Component Value Date   INSULIN 7.1 03/26/2017   CBC    Component Value Date/Time   WBC 7.3 03/26/2017 1157   RBC 4.88 03/26/2017 1157   HGB 14.6 03/26/2017 1157   HCT 44.6 03/26/2017 1157   MCV 91 03/26/2017 1157   MCH 29.9 03/26/2017 1157   MCHC 32.7 03/26/2017 1157   RDW 13.2 03/26/2017 1157   LYMPHSABS 2.7 03/26/2017 1157   EOSABS 0.2 03/26/2017 1157   BASOSABS 0.0 03/26/2017 1157   Iron/TIBC/Ferritin/ %Sat No results found for: IRON, TIBC, FERRITIN, IRONPCTSAT Lipid Panel     Component Value Date/Time   CHOL 209 (H) 03/26/2017 1157   TRIG 83 03/26/2017 1157   HDL 74  03/26/2017 1157   LDLCALC 118 (H) 03/26/2017 1157   Hepatic Function Panel     Component Value Date/Time   PROT 6.9 03/26/2017 1157   ALBUMIN 4.7 03/26/2017 1157   AST 16 03/26/2017 1157   ALT 19 03/26/2017 1157   ALKPHOS 92 03/26/2017 1157   BILITOT 0.4 03/26/2017 1157      Component Value Date/Time   TSH 2.920 03/26/2017 1157   Results for Babino, Tina Chavez (MRN [id number]) as of 04/25/2017 17:06  Ref. Range 03/26/2017 11:57  Vitamin D, 25-Hydroxy Latest Ref Range: 30.0 - 100.0 ng/mL 31.7  ASSESSMENT AND PLAN: Prediabetes - Plan: metFORMIN (GLUCOPHAGE) 500 MG tablet  Vitamin D deficiency  At risk for diabetes mellitus  Class 1 obesity with serious comorbidity and body mass index (BMI) of 30.0 to 30.9 in adult, unspecified obesity type  PLAN:  Vitamin D Deficiency Tina Chavez was informed that low vitamin D levels contributes to fatigue and are associated with obesity, breast, and colon cancer. She agrees to continue to take prescription Vit D @50 ,000 IU every week (no refill needed) and will follow up for routine testing of vitamin D, at least 2-3 times per year. She was informed of the risk of over-replacement of vitamin D and agrees to not increase her dose unless she discusses this with [place] first.  Pre-Diabetes Tina Chavez will continue to work on weight loss, exercise, and decreasing simple carbohydrates in her diet to help decrease the risk of diabetes. We dicussed metformin including benefits and risks. She was informed that eating too many simple carbohydrates or too many calories at one sitting increases the likelihood of GI side effects. Tina Chavez agreed to start metformin 500 mg qAM #30 with no refills and follow up with [place] as directed to monitor her progress.  Diabetes risk counseling Tina Chavez was given extended (15 minutes) diabetes prevention counseling today. She is 57 y.o. female and has risk factors for diabetes including obesity and pre-diabetes. We discussed intensive lifestyle  modifications today with an emphasis on weight loss as well as increasing exercise and decreasing simple carbohydrates in her diet.  Obesity Tina Chavez is currently in the action stage of change. As such, her goal is to continue with weight loss efforts She has agreed to follow the Category 2 plan Tina Chavez has been instructed to work up to a goal of 150 minutes of combined cardio and strengthening exercise per week for weight loss and overall health benefits. We discussed the following Behavioral Modification Strategies today: better snacking choices, increasing lean protein intake, increasing vegetables and work on meal planning and easy cooking plans  Tina Chavez has agreed to follow up with our clinic in 2 weeks. She was informed of the importance of frequent follow up visits to maximize her success with intensive lifestyle modifications for her multiple health conditions.   OBESITY BEHAVIORAL INTERVENTION VISIT  Today's visit was # 3 out of 22.  Starting weight: 174 lbs Starting date: 03/26/17 Today's weight : 174 lbs Today's date: 04/25/2017 Total lbs lost to date: 0 (Patients must lose 7 lbs in the first 6 months to continue with counseling)   ASK: We discussed the diagnosis of obesity with Tina Chavez today and Tina Chavez agreed to give [place] permission to discuss obesity behavioral modification therapy today.  ASSESS: Tina Chavez has the diagnosis of obesity and her BMI today is 30.83 Tina Chavez is in the action stage of change   ADVISE: Tina Chavez was educated on the multiple health risks of obesity as well as the benefit of weight loss to improve her health. She was advised of the need for long term treatment and the importance of lifestyle modifications.  AGREE: Multiple dietary modification options and treatment options were discussed and  Tina Chavez agreed to the above obesity treatment plan.  Corey Skains, am acting as transcriptionist for Eber Jones, MD

## 2017-05-14 ENCOUNTER — Ambulatory Visit (INDEPENDENT_AMBULATORY_CARE_PROVIDER_SITE_OTHER): Payer: No Typology Code available for payment source | Admitting: Family Medicine

## 2017-05-14 VITALS — BP 121/80 | HR 78 | Temp 97.9°F | Ht 63.0 in | Wt 176.0 lb

## 2017-05-14 DIAGNOSIS — Z9189 Other specified personal risk factors, not elsewhere classified: Secondary | ICD-10-CM

## 2017-05-14 DIAGNOSIS — R7303 Prediabetes: Secondary | ICD-10-CM

## 2017-05-14 DIAGNOSIS — E559 Vitamin D deficiency, unspecified: Secondary | ICD-10-CM

## 2017-05-14 DIAGNOSIS — Z6831 Body mass index (BMI) 31.0-31.9, adult: Secondary | ICD-10-CM | POA: Diagnosis not present

## 2017-05-14 DIAGNOSIS — E669 Obesity, unspecified: Secondary | ICD-10-CM

## 2017-05-14 MED ORDER — VITAMIN D (ERGOCALCIFEROL) 1.25 MG (50000 UNIT) PO CAPS: 50000.0000 [IU] | ORAL_CAPSULE | ORAL | 0 refills | Status: DC

## 2017-05-14 MED ORDER — METFORMIN HCL 500 MG PO TABS: 500.0000 mg | ORAL_TABLET | Freq: Every day | ORAL | 0 refills | Status: DC

## 2017-05-14 MED FILL — VIT D2 1.25 MG (50,000 UNIT: 1.25 MG | 28 days supply | Qty: 4 | Fill #0

## 2017-05-14 NOTE — Progress Notes (Signed)
Office: [phone]  /  Fax: [phone]   HPI:   Chief Complaint: OBESITY Tina Chavez is here to discuss her progress with her obesity treatment plan. She is on the Category 2 plan and is following her eating plan approximately 90 % of the time. She states she is exercising 0 minutes 0 times per week. Tina Chavez did well over the past few weeks. Has a few parties coming up. Does well picking food to eat on plan, not worried about overindulging.  Her weight is 176 lb (79.8 kg) today and has gained 2 pounds since her last visit. She has lost 0 lbs since starting treatment with [place].  Vitamin D Deficiency Tina Chavez has a diagnosis of vitamin D deficiency. She is currently taking prescription Vit D and denies nausea, vomiting or muscle weakness.  At risk for osteopenia and osteoporosis Tina Chavez is at higher risk of osteopenia and osteoporosis due to vitamin D deficiency.   Pre-Diabetes Tina Chavez has a diagnosis of pre-diabetes based on her elevated Hgb A1c and was informed this puts her at greater risk of developing diabetes. She is taking metformin currently and continues to work on diet and exercise to decrease risk of diabetes. She denies carbohydrate cravings. She denies nausea or hypoglycemia.  ALLERGIES: Allergies  Allergen Reactions  . Sulfa Antibiotics Anaphylaxis  . Pollen Extract     MEDICATIONS: Current Outpatient Medications on File Prior to Visit  Medication Sig Dispense Refill  . aspirin EC 81 MG tablet Take 81 mg by mouth daily.    . Calcium Carbonate-Vitamin D3 (CALCIUM 600-D) 600-400 MG-UNIT TABS Takes 2 tablets daily.    Tina Chavez Calcium (STOOL SOFTENER PO) Take by mouth daily as needed.    . loratadine (CLARITIN) 10 MG tablet Take 10 mg by mouth daily.    . Multiple Vitamin (MULTIVITAMIN) tablet Take 1 tablet by mouth daily.    . Omega-3 Fatty Acids (FISH OIL) 1000 MG CAPS 2 capsules daily.    Tina Chavez Kitchen omeprazole (PRILOSEC) 10 MG capsule Take 20 mg by mouth daily.     . polyethylene  glycol powder (GLYCOLAX/MIRALAX) powder Take 1 Container by mouth daily.    . Probiotic Product (PROBIOTIC ADVANCED PO) Take by mouth daily.     No current facility-administered medications on file prior to visit.     PAST MEDICAL HISTORY: Past Medical History:  Diagnosis Date  . Abnormal Pap smear of cervix    age 53  . Abnormal uterine bleeding   . Allergy   . Chronic constipation   . Depression   . Endometriosis   . GERD (gastroesophageal reflux disease)   . Hemorrhoids   . Hyperlipidemia   . Obesity   . Osteopenia     PAST SURGICAL HISTORY: Past Surgical History:  Procedure Laterality Date  . ABDOMINOPLASTY  2006   Dr. Lesli Albee  . ABDOMINOPLASTY  2006  . BREAST REDUCTION SURGERY  2006  . [place] OF UTERUS  2016  . FRACTURE SURGERY  1972   Arm   . GYNECOLOGIC CRYOSURGERY     at age 24  . HYSTEROSCOPY  2006    SOCIAL HISTORY: Social History   Tobacco Use  . Smoking status: Never Smoker  . Smokeless tobacco: Never Used  Substance Use Topics  . Alcohol use: No  . Drug use: No    FAMILY HISTORY: Family History  Problem Relation Age of Onset  . Hyperlipidemia Mother   . Stroke Mother   . Diabetes Mother   . Hypertension  Mother   . Kidney disease Mother   . Thyroid disease Mother   . Eating disorder Mother   . Obesity Mother   . Arthritis Father   . Heart disease Father   . Sudden death Father   . Hyperlipidemia Maternal Grandmother   . Heart disease Maternal Grandmother   . Mental illness Maternal Grandmother   . Alcohol abuse Maternal Grandfather   . Arthritis Paternal Grandmother   . Hyperlipidemia Paternal Grandmother   . Heart disease Paternal Grandmother   . Alcohol abuse Paternal Grandfather     ROS: Review of Systems  Constitutional: Negative for weight loss.  Gastrointestinal: Negative for nausea and vomiting.  Musculoskeletal:       Negative muscle weakness  Endo/Heme/Allergies:       Negative hypoglycemia     PHYSICAL EXAM: Blood pressure 121/80, pulse 78, temperature 97.9 F (36.6 C), temperature source Oral, height 5\' 3"  (1.6 m), weight 176 lb (79.8 kg), last menstrual period 01/01/2014, SpO2 99 %. Body mass index is 31.18 kg/m. Physical Exam  Constitutional: She is oriented to person, place, and time. She appears well-developed and well-nourished.  Cardiovascular: Normal rate.  Pulmonary/Chest: Effort normal.  Musculoskeletal: Normal range of motion.  Neurological: She is oriented to person, place, and time.  Skin: Skin is warm and dry.  Psychiatric: She has a normal mood and affect. Her behavior is normal.  Vitals reviewed.   RECENT LABS AND TESTS: BMET    Component Value Date/Time   NA 140 03/26/2017 1157   K 4.7 03/26/2017 1157   CL 102 03/26/2017 1157   CO2 24 03/26/2017 1157   GLUCOSE 104 (H) 03/26/2017 1157   BUN 20 03/26/2017 1157   CREATININE 0.71 03/26/2017 1157   CALCIUM 9.3 03/26/2017 1157   GFRNONAA 94 03/26/2017 1157   GFRAA 109 03/26/2017 1157   Lab Results  Component Value Date   HGBA1C 5.7 (H) 03/26/2017   Lab Results  Component Value Date   INSULIN 7.1 03/26/2017   CBC    Component Value Date/Time   WBC 7.3 03/26/2017 1157   RBC 4.88 03/26/2017 1157   HGB 14.6 03/26/2017 1157   HCT 44.6 03/26/2017 1157   MCV 91 03/26/2017 1157   MCH 29.9 03/26/2017 1157   MCHC 32.7 03/26/2017 1157   RDW 13.2 03/26/2017 1157   LYMPHSABS 2.7 03/26/2017 1157   EOSABS 0.2 03/26/2017 1157   BASOSABS 0.0 03/26/2017 1157   Iron/TIBC/Ferritin/ %Sat No results found for: IRON, TIBC, FERRITIN, IRONPCTSAT Lipid Panel     Component Value Date/Time   CHOL 209 (H) 03/26/2017 1157   TRIG 83 03/26/2017 1157   HDL 74 03/26/2017 1157   LDLCALC 118 (H) 03/26/2017 1157   Hepatic Function Panel     Component Value Date/Time   PROT 6.9 03/26/2017 1157   ALBUMIN 4.7 03/26/2017 1157   AST 16 03/26/2017 1157   ALT 19 03/26/2017 1157   ALKPHOS 92 03/26/2017 1157    BILITOT 0.4 03/26/2017 1157      Component Value Date/Time   TSH 2.920 03/26/2017 1157  Results for Snell, Jemina (MRN [id number]) as of 05/14/2017 11:51  Ref. Range 03/26/2017 11:57  Vitamin D, 25-Hydroxy Latest Ref Range: 30.0 - 100.0 ng/mL 31.7    ASSESSMENT AND PLAN: Vitamin D deficiency - Plan: Vitamin D, Ergocalciferol, (DRISDOL) 50000 units CAPS capsule  Prediabetes - Plan: metFORMIN (GLUCOPHAGE) 500 MG tablet  At risk for osteoporosis  Class 1 obesity with serious comorbidity and body mass index (  BMI) of 31.0 to 31.9 in adult, unspecified obesity type  PLAN:  Vitamin D Deficiency Yuliana was informed that low vitamin D levels contributes to fatigue and are associated with obesity, breast, and colon cancer. Hailly agrees to continue taking prescription Vit D @50 ,000 IU every week #4 and we will refill for 1 month. She will follow up for routine testing of vitamin D, at least 2-3 times per year. She was informed of the risk of over-replacement of vitamin D and agrees to not increase her dose unless she discusses this with [place] first. Tina Chavez agrees to follow up with our clinic in 2 weeks.  At risk for osteopenia and osteoporosis Tina Chavez is at risk for osteopenia and osteoporsis due to her vitamin D deficiency. She was encouraged to take her vitamin D and follow her higher calcium diet and increase strengthening exercise to help strengthen her bones and decrease her risk of osteopenia and osteoporosis.  Pre-Diabetes Tina Chavez will continue to work on weight loss, exercise, and decreasing simple carbohydrates in her diet to help decrease the risk of diabetes. We dicussed metformin including benefits and risks. She was informed that eating too many simple carbohydrates or too many calories at one sitting increases the likelihood of GI side effects. Tina Chavez agrees to continue taking metformin 500 mg PO q AM #30 and we will refill for 1 month. Tina Chavez agrees to follow up with our clinic in 2 weeks as  directed to monitor her progress.  Obesity Tina Chavez is currently in the action stage of change. As such, her goal is to continue with weight loss efforts She has agreed to follow the Category 2 plan Tina Chavez has been instructed to work up to a goal of 150 minutes of combined cardio and strengthening exercise per week for weight loss and overall health benefits. We discussed the following Behavioral Modification Strategies today: increasing lean protein intake, increasing vegetables, work on meal planning and easy cooking plans, better snacking choices, and planning for success   Tina Chavez has agreed to follow up with our clinic in 2 weeks. She was informed of the importance of frequent follow up visits to maximize her success with intensive lifestyle modifications for her multiple health conditions.   OBESITY BEHAVIORAL INTERVENTION VISIT  Today's visit was # 4 out of 22.  Starting weight: 174 lbs Starting date: 03/26/17 Today's weight : 176 lbs  Today's date: 05/14/2017 Total lbs lost to date: 0 (Patients must lose 7 lbs in the first 6 months to continue with counseling)   ASK: We discussed the diagnosis of obesity with Tina Chavez today and Tina Chavez agreed to give [place] permission to discuss obesity behavioral modification therapy today.  ASSESS: Tina Chavez has the diagnosis of obesity and her BMI today is 31.18 Tina Chavez is in the action stage of change   ADVISE: Tina Chavez was educated on the multiple health risks of obesity as well as the benefit of weight loss to improve her health. She was advised of the need for long term treatment and the importance of lifestyle modifications.  AGREE: Multiple dietary modification options and treatment options were discussed and  Tina Chavez agreed to the above obesity treatment plan.  I, Trixie Dredge, am acting as transcriptionist for Ilene Qua, MD  I have reviewed the above documentation for accuracy and completeness, and I agree with the above. - Ilene Qua, MD

## 2017-05-28 MED FILL — metFORMIN HCL 500 MG TABS: 500 | 3 days supply | Qty: 30 | Fill #0

## 2017-06-05 ENCOUNTER — Ambulatory Visit (INDEPENDENT_AMBULATORY_CARE_PROVIDER_SITE_OTHER): Payer: No Typology Code available for payment source | Admitting: Family Medicine

## 2017-06-05 VITALS — BP 111/74 | HR 71 | Temp 97.9°F | Ht 63.0 in | Wt 176.0 lb

## 2017-06-05 DIAGNOSIS — Z9189 Other specified personal risk factors, not elsewhere classified: Secondary | ICD-10-CM

## 2017-06-05 DIAGNOSIS — E1165 Type 2 diabetes mellitus with hyperglycemia: Secondary | ICD-10-CM | POA: Insufficient documentation

## 2017-06-05 DIAGNOSIS — E669 Obesity, unspecified: Secondary | ICD-10-CM

## 2017-06-05 DIAGNOSIS — E559 Vitamin D deficiency, unspecified: Secondary | ICD-10-CM | POA: Diagnosis not present

## 2017-06-05 DIAGNOSIS — Z6831 Body mass index (BMI) 31.0-31.9, adult: Secondary | ICD-10-CM | POA: Diagnosis not present

## 2017-06-05 DIAGNOSIS — R7303 Prediabetes: Secondary | ICD-10-CM | POA: Diagnosis not present

## 2017-06-05 MED ORDER — VITAMIN D (ERGOCALCIFEROL) 1.25 MG (50000 UNIT) PO CAPS: 50000.0000 [IU] | ORAL_CAPSULE | ORAL | 0 refills | Status: DC

## 2017-06-05 NOTE — Progress Notes (Signed)
Office: [phone]  /  Fax: [phone]   HPI:   Chief Complaint: OBESITY Tina Chavez is here to discuss her progress with her obesity treatment plan. She is on the Category 2 plan and is following her eating plan approximately 90 % of the time. She states she is doing resistance training for 10 minutes 2 times per week. Tina Chavez hasn't had much change in terms of weight but fat mass has decreased.  Her weight is 176 lb (79.8 kg) today and has not lost weight since her last visit. She has lost 0 lbs since starting treatment with [place].  Vitamin D Deficiency Tina Chavez has a diagnosis of vitamin D deficiency. She is currently taking prescription Vit D. She notes fatigue and denies nausea, vomiting or muscle weakness.  At risk for osteopenia and osteoporosis Tina Chavez is at higher risk of osteopenia and osteoporosis due to vitamin D deficiency.   Pre-Diabetes Tina Chavez has a diagnosis of pre-diabetes based on her elevated Hgb A1c and was informed this puts her at greater risk of developing diabetes. She denies GI side effects on metformin and continues to work on diet and exercise to decrease risk of diabetes. She denies nausea or hypoglycemia.  ALLERGIES: Allergies  Allergen Reactions  . Sulfa Antibiotics Anaphylaxis  . Pollen Extract     MEDICATIONS: Current Outpatient Medications on File Prior to Visit  Medication Sig Dispense Refill  . aspirin EC 81 MG tablet Take 81 mg by mouth daily.    . Calcium Carbonate-Vitamin D3 (CALCIUM 600-D) 600-400 MG-UNIT TABS Takes 2 tablets daily.    Tina Chavez Calcium (STOOL SOFTENER PO) Take by mouth daily as needed.    . loratadine (CLARITIN) 10 MG tablet Take 10 mg by mouth daily.    . metFORMIN (GLUCOPHAGE) 500 MG tablet Take 1 tablet (500 mg total) by mouth daily with breakfast. 30 tablet 0  . Multiple Vitamin (MULTIVITAMIN) tablet Take 1 tablet by mouth daily.    . Omega-3 Fatty Acids (FISH OIL) 1000 MG CAPS 2 capsules daily.    [name] omeprazole (PRILOSEC) 10 MG  capsule Take 20 mg by mouth daily.     . polyethylene glycol powder (GLYCOLAX/MIRALAX) powder Take 1 Container by mouth daily.    . Probiotic Product (PROBIOTIC ADVANCED PO) Take by mouth daily.    . Vitamin D, Ergocalciferol, (DRISDOL) 50000 units CAPS capsule Take 1 capsule (50,000 Units total) by mouth every 7 (seven) days. 4 capsule 0   No current facility-administered medications on file prior to visit.     PAST MEDICAL HISTORY: Past Medical History:  Diagnosis Date  . Abnormal Pap smear of cervix    age 12  . Abnormal uterine bleeding   . Allergy   . Chronic constipation   . Depression   . Endometriosis   . GERD (gastroesophageal reflux disease)   . Hemorrhoids   . Hyperlipidemia   . Obesity   . Osteopenia     PAST SURGICAL HISTORY: Past Surgical History:  Procedure Laterality Date  . ABDOMINOPLASTY  2006   Dr. Lesli Albee  . ABDOMINOPLASTY  2006  . BREAST REDUCTION SURGERY  2006  . [place] OF UTERUS  2016  . FRACTURE SURGERY  1972   Arm   . GYNECOLOGIC CRYOSURGERY     at age 76  . HYSTEROSCOPY  2006    SOCIAL HISTORY: Social History   Tobacco Use  . Smoking status: Never Smoker  . Smokeless tobacco: Never Used  Substance Use Topics  . Alcohol  use: No  . Drug use: No    FAMILY HISTORY: Family History  Problem Relation Age of Onset  . Hyperlipidemia Mother   . Stroke Mother   . Diabetes Mother   . Hypertension Mother   . Kidney disease Mother   . Thyroid disease Mother   . Eating disorder Mother   . Obesity Mother   . Arthritis Father   . Heart disease Father   . Sudden death Father   . Hyperlipidemia Maternal Grandmother   . Heart disease Maternal Grandmother   . Mental illness Maternal Grandmother   . Alcohol abuse Maternal Grandfather   . Arthritis Paternal Grandmother   . Hyperlipidemia Paternal Grandmother   . Heart disease Paternal Grandmother   . Alcohol abuse Paternal Grandfather     ROS: Review of Systems    Constitutional: Positive for malaise/fatigue. Negative for weight loss.  Gastrointestinal: Negative for nausea and vomiting.  Musculoskeletal:       Negative muscle weakness  Endo/Heme/Allergies:       Negative hypoglycemia    PHYSICAL EXAM: Blood pressure 111/74, pulse 71, temperature 97.9 F (36.6 C), temperature source Oral, height 5\' 3"  (1.6 m), weight 176 lb (79.8 kg), last menstrual period 01/01/2014, SpO2 98 %. Body mass index is 31.18 kg/m. Physical Exam  Constitutional: She is oriented to person, place, and time. She appears well-developed and well-nourished.  Cardiovascular: Normal rate.  Pulmonary/Chest: Effort normal.  Musculoskeletal: Normal range of motion.  Neurological: She is oriented to person, place, and time.  Skin: Skin is warm and dry.  Psychiatric: She has a normal mood and affect. Her behavior is normal.  Vitals reviewed.   RECENT LABS AND TESTS: BMET    Component Value Date/Time   NA 140 03/26/2017 1157   K 4.7 03/26/2017 1157   CL 102 03/26/2017 1157   CO2 24 03/26/2017 1157   GLUCOSE 104 (H) 03/26/2017 1157   BUN 20 03/26/2017 1157   CREATININE 0.71 03/26/2017 1157   CALCIUM 9.3 03/26/2017 1157   GFRNONAA 94 03/26/2017 1157   GFRAA 109 03/26/2017 1157   Lab Results  Component Value Date   HGBA1C 5.7 (H) 03/26/2017   Lab Results  Component Value Date   INSULIN 7.1 03/26/2017   CBC    Component Value Date/Time   WBC 7.3 03/26/2017 1157   RBC 4.88 03/26/2017 1157   HGB 14.6 03/26/2017 1157   HCT 44.6 03/26/2017 1157   MCV 91 03/26/2017 1157   MCH 29.9 03/26/2017 1157   MCHC 32.7 03/26/2017 1157   RDW 13.2 03/26/2017 1157   LYMPHSABS 2.7 03/26/2017 1157   EOSABS 0.2 03/26/2017 1157   BASOSABS 0.0 03/26/2017 1157   Iron/TIBC/Ferritin/ %Sat No results found for: IRON, TIBC, FERRITIN, IRONPCTSAT Lipid Panel     Component Value Date/Time   CHOL 209 (H) 03/26/2017 1157   TRIG 83 03/26/2017 1157   HDL 74 03/26/2017 1157    LDLCALC 118 (H) 03/26/2017 1157   Hepatic Function Panel     Component Value Date/Time   PROT 6.9 03/26/2017 1157   ALBUMIN 4.7 03/26/2017 1157   AST 16 03/26/2017 1157   ALT 19 03/26/2017 1157   ALKPHOS 92 03/26/2017 1157   BILITOT 0.4 03/26/2017 1157      Component Value Date/Time   TSH 2.920 03/26/2017 1157  Results for Chavez, Tina (MRN [id number]) as of 06/05/2017 08:41  Ref. Range 03/26/2017 11:57  Vitamin D, 25-Hydroxy Latest Ref Range: 30.0 - 100.0 ng/mL 31.7  ASSESSMENT AND PLAN: Vitamin D deficiency - Plan: Vitamin D, Ergocalciferol, (DRISDOL) 50000 units CAPS capsule  Prediabetes  At risk for osteoporosis  Class 1 obesity with serious comorbidity and body mass index (BMI) of 31.0 to 31.9 in adult, unspecified obesity type  PLAN:  Vitamin D Deficiency Tina Chavez was informed that low vitamin D levels contributes to fatigue and are associated with obesity, breast, and colon cancer. Tina Chavez agrees to continue taking prescription Vit D @50 ,000 IU every week #4 and we will refill for 1 month. She will follow up for routine testing of vitamin D, at least 2-3 times per year. She was informed of the risk of over-replacement of vitamin D and agrees to not increase her dose unless she discusses this with [place] first. Lavern agrees to follow up with our clinic in 2 weeks.  At risk for osteopenia and osteoporosis Tina Chavez is at risk for osteopenia and osteoporsis due to her vitamin D deficiency. She was encouraged to take her vitamin D and follow her higher calcium diet and increase strengthening exercise to help strengthen her bones and decrease her risk of osteopenia and osteoporosis.  Pre-Diabetes Tina Chavez will continue to work on weight loss, exercise, and decreasing simple carbohydrates in her diet to help decrease the risk of diabetes. We dicussed metformin including benefits and risks. She was informed that eating too many simple carbohydrates or too many calories at one sitting increases  the likelihood of GI side effects. Niajah agrees to continue taking metformin 500 mg PO daily, no refill needed. Tina Chavez agrees to follow up with our clinic in 2 weeks as directed to monitor her progress.  Obesity Tina Chavez is currently in the action stage of change. As such, her goal is to continue with weight loss efforts She has agreed to follow the Category 2 plan Tina Chavez has been instructed to work up to a goal of 150 minutes of combined cardio and strengthening exercise per week or increase resistance training 3-4 times per week for weight loss and overall health benefits. We discussed the following Behavioral Modification Strategies today: increasing lean protein intake, increasing vegetables, better snacking choices, and planning for success   Tina Chavez has agreed to follow up with our clinic in 2 weeks. She was informed of the importance of frequent follow up visits to maximize her success with intensive lifestyle modifications for her multiple health conditions.   OBESITY BEHAVIORAL INTERVENTION VISIT  Today's visit was # 5 out of 22.  Starting weight: 174 lbs Starting date: 03/26/17 Today's weight : 176 lbs  Today's date: 06/05/2017 Total lbs lost to date: 0 (Patients must lose 7 lbs in the first 6 months to continue with counseling)   ASK: We discussed the diagnosis of obesity with Tina Chavez today and Tina Chavez agreed to give [place] permission to discuss obesity behavioral modification therapy today.  ASSESS: Tina Chavez has the diagnosis of obesity and her BMI today is 31.18 Tina Chavez is in the action stage of change   ADVISE: Tina Chavez was educated on the multiple health risks of obesity as well as the benefit of weight loss to improve her health. She was advised of the need for long term treatment and the importance of lifestyle modifications.  AGREE: Multiple dietary modification options and treatment options were discussed and  Tina Chavez agreed to the above obesity treatment plan.  I, Trixie Dredge,  am acting as transcriptionist for Ilene Qua, MD  I have reviewed the above documentation for accuracy and completeness, and I agree with the above. -  Ilene Qua, MD

## 2017-06-14 MED FILL — VIT D2 1.25 MG (50,000 UNIT: 1.25 MG | 28 days supply | Qty: 4 | Fill #0

## 2017-06-26 ENCOUNTER — Ambulatory Visit (INDEPENDENT_AMBULATORY_CARE_PROVIDER_SITE_OTHER): Payer: No Typology Code available for payment source | Admitting: Family Medicine

## 2017-06-26 VITALS — BP 114/71 | HR 73 | Temp 98.1°F | Ht 63.0 in | Wt 178.0 lb

## 2017-06-26 DIAGNOSIS — E669 Obesity, unspecified: Secondary | ICD-10-CM

## 2017-06-26 DIAGNOSIS — E559 Vitamin D deficiency, unspecified: Secondary | ICD-10-CM

## 2017-06-26 DIAGNOSIS — Z6831 Body mass index (BMI) 31.0-31.9, adult: Secondary | ICD-10-CM | POA: Diagnosis not present

## 2017-06-26 DIAGNOSIS — R7303 Prediabetes: Secondary | ICD-10-CM | POA: Diagnosis not present

## 2017-06-26 DIAGNOSIS — Z9189 Other specified personal risk factors, not elsewhere classified: Secondary | ICD-10-CM | POA: Diagnosis not present

## 2017-06-26 MED ORDER — VITAMIN D (ERGOCALCIFEROL) 1.25 MG (50000 UNIT) PO CAPS: 50000.0000 [IU] | ORAL_CAPSULE | ORAL | 0 refills | Status: DC

## 2017-06-26 MED ORDER — METFORMIN HCL 500 MG PO TABS: 500.0000 mg | ORAL_TABLET | Freq: Every day | ORAL | 0 refills | Status: DC

## 2017-06-26 MED FILL — metFORMIN HCL 500 MG TABS: 500 | 30 days supply | Qty: 30 | Fill #0

## 2017-06-26 NOTE — Progress Notes (Signed)
Office: [phone]  /  Fax: [phone]   HPI:   Chief Complaint: OBESITY Tina Chavez is here to discuss her progress with her obesity treatment plan. She is on the Category 2 plan and is following her eating plan approximately 50 % of the time. She states she is walking 2 miles 4 times per week. Kurt is followed by Dr. Adair Patter but referred to med as she is struggling to lose weight. She is not following her plan strictly and doing more portion control and smarter food choices. She is frustrated this is not causing weight loss.  Her weight is 178 lb (80.7 kg) today and has gained 2 pounds since her last visit. She has lost 0 lbs since starting treatment with [place].  Pre-Diabetes Maleigh has a diagnosis of pre-diabetes based on her elevated Hgb A1c and was informed this puts her at greater risk of developing diabetes. She is stable on metformin, she denies nausea, vomiting, or hypoglycemia. She is struggling to follow diet prescription and gaining weight. She continues to work on diet and exercise to decrease risk of diabetes.   At risk for diabetes Lashone is at higher than average risk for developing diabetes due to her obesity and pre-diabetes. She currently denies polyuria or polydipsia.  Vitamin D Deficiency Sandra has a diagnosis of vitamin D deficiency. She is stable on prescription Vit D. She notes fatigue and denies nausea, vomiting or muscle weakness.  ALLERGIES: Allergies  Allergen Reactions  . Sulfa Antibiotics Anaphylaxis  . Pollen Extract     MEDICATIONS: Current Outpatient Medications on File Prior to Visit  Medication Sig Dispense Refill  . aspirin EC 81 MG tablet Take 81 mg by mouth daily.    . Calcium Carbonate-Vitamin D3 (CALCIUM 600-D) 600-400 MG-UNIT TABS Takes 2 tablets daily.    Mariane Baumgarten Calcium (STOOL SOFTENER PO) Take by mouth daily as needed.    . loratadine (CLARITIN) 10 MG tablet Take 10 mg by mouth daily.    . metFORMIN (GLUCOPHAGE) 500 MG tablet Take 1 tablet  (500 mg total) by mouth daily with breakfast. 30 tablet 0  . Multiple Vitamin (MULTIVITAMIN) tablet Take 1 tablet by mouth daily.    . Omega-3 Fatty Acids (FISH OIL) 1000 MG CAPS 2 capsules daily.    [name] omeprazole (PRILOSEC) 10 MG capsule Take 20 mg by mouth daily.     . polyethylene glycol powder (GLYCOLAX/MIRALAX) powder Take 1 Container by mouth daily.    . Probiotic Product (PROBIOTIC ADVANCED PO) Take by mouth daily.    . Vitamin D, Ergocalciferol, (DRISDOL) 50000 units CAPS capsule Take 1 capsule (50,000 Units total) by mouth every 7 (seven) days. 4 capsule 0   No current facility-administered medications on file prior to visit.     PAST MEDICAL HISTORY: Past Medical History:  Diagnosis Date  . Abnormal Pap smear of cervix    age 57  . Abnormal uterine bleeding   . Allergy   . Chronic constipation   . Depression   . Endometriosis   . GERD (gastroesophageal reflux disease)   . Hemorrhoids   . Hyperlipidemia   . Obesity   . Osteopenia     PAST SURGICAL HISTORY: Past Surgical History:  Procedure Laterality Date  . ABDOMINOPLASTY  2006   Dr. Lesli Albee  . ABDOMINOPLASTY  2006  . BREAST REDUCTION SURGERY  2006  . [place] OF UTERUS  2016  . FRACTURE SURGERY  1972   Arm   . GYNECOLOGIC CRYOSURGERY  at age 37  . HYSTEROSCOPY  2006    SOCIAL HISTORY: Social History   Tobacco Use  . Smoking status: Never Smoker  . Smokeless tobacco: Never Used  Substance Use Topics  . Alcohol use: No  . Drug use: No    FAMILY HISTORY: Family History  Problem Relation Age of Onset  . Hyperlipidemia Mother   . Stroke Mother   . Diabetes Mother   . Hypertension Mother   . Kidney disease Mother   . Thyroid disease Mother   . Eating disorder Mother   . Obesity Mother   . Arthritis Father   . Heart disease Father   . Sudden death Father   . Hyperlipidemia Maternal Grandmother   . Heart disease Maternal Grandmother   . Mental illness Maternal Grandmother   .  Alcohol abuse Maternal Grandfather   . Arthritis Paternal Grandmother   . Hyperlipidemia Paternal Grandmother   . Heart disease Paternal Grandmother   . Alcohol abuse Paternal Grandfather     ROS: Review of Systems  Constitutional: Positive for malaise/fatigue. Negative for weight loss.  Gastrointestinal: Negative for nausea and vomiting.  Genitourinary: Negative for frequency.  Musculoskeletal:       Negative muscle weakness  Endo/Heme/Allergies: Negative for polydipsia.       Negative hypoglycemia    PHYSICAL EXAM: Blood pressure 114/71, pulse 73, temperature 98.1 F (36.7 C), temperature source Oral, height 5\' 3"  (1.6 m), weight 178 lb (80.7 kg), last menstrual period 01/01/2014, SpO2 97 %. Body mass index is 31.53 kg/m. Physical Exam  Constitutional: She is oriented to person, place, and time. She appears well-developed and well-nourished.  Cardiovascular: Normal rate.  Pulmonary/Chest: Effort normal.  Musculoskeletal: Normal range of motion.  Neurological: She is oriented to person, place, and time.  Skin: Skin is warm and dry.  Psychiatric: She has a normal mood and affect. Her behavior is normal.  Vitals reviewed.   RECENT LABS AND TESTS: BMET    Component Value Date/Time   NA 140 03/26/2017 1157   K 4.7 03/26/2017 1157   CL 102 03/26/2017 1157   CO2 24 03/26/2017 1157   GLUCOSE 104 (H) 03/26/2017 1157   BUN 20 03/26/2017 1157   CREATININE 0.71 03/26/2017 1157   CALCIUM 9.3 03/26/2017 1157   GFRNONAA 94 03/26/2017 1157   GFRAA 109 03/26/2017 1157   Lab Results  Component Value Date   HGBA1C 5.7 (H) 03/26/2017   Lab Results  Component Value Date   INSULIN 7.1 03/26/2017   CBC    Component Value Date/Time   WBC 7.3 03/26/2017 1157   RBC 4.88 03/26/2017 1157   HGB 14.6 03/26/2017 1157   HCT 44.6 03/26/2017 1157   MCV 91 03/26/2017 1157   MCH 29.9 03/26/2017 1157   MCHC 32.7 03/26/2017 1157   RDW 13.2 03/26/2017 1157   LYMPHSABS 2.7 03/26/2017  1157   EOSABS 0.2 03/26/2017 1157   BASOSABS 0.0 03/26/2017 1157   Iron/TIBC/Ferritin/ %Sat No results found for: IRON, TIBC, FERRITIN, IRONPCTSAT Lipid Panel     Component Value Date/Time   CHOL 209 (H) 03/26/2017 1157   TRIG 83 03/26/2017 1157   HDL 74 03/26/2017 1157   LDLCALC 118 (H) 03/26/2017 1157   Hepatic Function Panel     Component Value Date/Time   PROT 6.9 03/26/2017 1157   ALBUMIN 4.7 03/26/2017 1157   AST 16 03/26/2017 1157   ALT 19 03/26/2017 1157   ALKPHOS 92 03/26/2017 1157   BILITOT 0.4 03/26/2017  1157      Component Value Date/Time   TSH 2.920 03/26/2017 1157  Results for MADAILEIN, LONDO (MRN [id number]) as of 06/26/2017 10:10  Ref. Range 03/26/2017 11:57  Vitamin D, 25-Hydroxy Latest Ref Range: 30.0 - 100.0 ng/mL 31.7    ASSESSMENT AND PLAN: Prediabetes - Plan: metFORMIN (GLUCOPHAGE) 500 MG tablet  Vitamin D deficiency - Plan: Vitamin D, Ergocalciferol, (DRISDOL) 50000 units CAPS capsule  At risk for diabetes mellitus  Class 1 obesity with serious comorbidity and body mass index (BMI) of 31.0 to 31.9 in adult, unspecified obesity type  PLAN:  Pre-Diabetes Wyvonne will continue to work on weight loss, exercise, and decreasing simple carbohydrates in her diet to help decrease the risk of diabetes. We dicussed metformin including benefits and risks. She was informed that eating too many simple carbohydrates or too many calories at one sitting increases the likelihood of GI side effects. Nanami agrees to continue taking metformin 500 mg q AM #30 and we will refill for 1 month. Sansa agrees to follow up with our clinic in 2 to 3 weeks as directed to monitor her progress.  Diabetes risk counselling Bryann was given extended (15 minutes) diabetes prevention counseling today. She is 57 y.o. female and has risk factors for diabetes including obesity and pre-diabetes. We discussed intensive lifestyle modifications today with an emphasis on weight loss as well as  increasing exercise and decreasing simple carbohydrates in her diet.  Vitamin D Deficiency Jaymi was informed that low vitamin D levels contributes to fatigue and are associated with obesity, breast, and colon cancer. Lavina agrees to continue taking prescription Vit D @50 ,000 IU every week #4 and we will refill for 1 month. She will follow up for routine testing of vitamin D, at least 2-3 times per year. She was informed of the risk of over-replacement of vitamin D and agrees to not increase her dose unless she discusses this with [place] first. Madelynn agrees to follow up with our clinic in 2 to 3 weeks.  Obesity Maudy is currently in the action stage of change. As such, her goal is to continue with weight loss efforts She has agreed to change to keep a food journal with 1100-1200 calories and 70 grams of protein daily Yesmin has been instructed to work up to a goal of 150 minutes of combined cardio and strengthening exercise per week for weight loss and overall health benefits. We discussed the following Behavioral Modification Strategies today: increasing lean protein intake, work on meal planning and easy cooking plans, and better snacking choices   Wiley has agreed to follow up with our clinic in 2 to 3 weeks. She was informed of the importance of frequent follow up visits to maximize her success with intensive lifestyle modifications for her multiple health conditions.   OBESITY BEHAVIORAL INTERVENTION VISIT  Today's visit was # 6 out of 22.  Starting weight: 174 lbs Starting date: 03/26/17 Today's weight : 178 lbs Today's date: 06/26/2017 Total lbs lost to date: 0 (Patients must lose 7 lbs in the first 6 months to continue with counseling)   ASK: We discussed the diagnosis of obesity with Marguerite Kasper today and Florenda agreed to give [place] permission to discuss obesity behavioral modification therapy today.  ASSESS: Kadijah has the diagnosis of obesity and her BMI today is 31.54 Meleane is in  the action stage of change   ADVISE: Gregg was educated on the multiple health risks of obesity as well as the benefit of weight loss to  improve her health. She was advised of the need for long term treatment and the importance of lifestyle modifications.  AGREE: Multiple dietary modification options and treatment options were discussed and  Kelsa agreed to the above obesity treatment plan.  I, Trixie Dredge, am acting as transcriptionist for Dennard Nip, MD  I have reviewed the above documentation for accuracy and completeness, and I agree with the above. -Dennard Nip, MD

## 2017-07-05 MED FILL — VIT D2 1.25 MG (50,000 UNIT: 1.25 MG | 28 days supply | Qty: 4 | Fill #0

## 2017-07-16 ENCOUNTER — Ambulatory Visit (INDEPENDENT_AMBULATORY_CARE_PROVIDER_SITE_OTHER): Payer: No Typology Code available for payment source | Admitting: Physician Assistant

## 2017-07-16 VITALS — BP 121/82 | HR 64 | Ht 63.0 in | Wt 175.0 lb

## 2017-07-16 DIAGNOSIS — E559 Vitamin D deficiency, unspecified: Secondary | ICD-10-CM

## 2017-07-16 DIAGNOSIS — R7303 Prediabetes: Secondary | ICD-10-CM

## 2017-07-16 DIAGNOSIS — Z6831 Body mass index (BMI) 31.0-31.9, adult: Secondary | ICD-10-CM | POA: Diagnosis not present

## 2017-07-16 DIAGNOSIS — E669 Obesity, unspecified: Secondary | ICD-10-CM | POA: Diagnosis not present

## 2017-07-16 DIAGNOSIS — Z9189 Other specified personal risk factors, not elsewhere classified: Secondary | ICD-10-CM | POA: Diagnosis not present

## 2017-07-17 MED ORDER — VITAMIN D (ERGOCALCIFEROL) 1.25 MG (50000 UNIT) PO CAPS: 50000.0000 [IU] | ORAL_CAPSULE | ORAL | 0 refills | Status: DC

## 2017-07-17 MED ORDER — METFORMIN HCL 500 MG PO TABS: 500.0000 mg | ORAL_TABLET | Freq: Every day | ORAL | 0 refills | Status: DC

## 2017-07-17 NOTE — Progress Notes (Signed)
Office: [phone]  /  Fax: [phone]   HPI:   Chief Complaint: OBESITY Tina Chavez is here to discuss her progress with her obesity treatment plan. She is on the keep a food journal with 1100 to 1200 calories and 70 grams of protein daily and is following her eating plan approximately 100 % of the time. She states she is walking 40 to 45 minutes 4 times per week. Tina Chavez has continued to do well with weight loss. She would like cruise eating strategies. Her weight is 175 lb (79.4 kg) today and has had a weight loss of 3 pounds over a period of 3 weeks since her last visit. She has gained 1 lb since starting treatment with [place].  Vitamin D deficiency Tina Chavez has a diagnosis of vitamin D deficiency. She is currently taking vit D and denies nausea, vomiting or muscle weakness.  At risk for osteopenia and osteoporosis Tina Chavez is at higher risk of osteopenia and osteoporosis due to vitamin D deficiency.   Pre-Diabetes Tina Chavez has a diagnosis of prediabetes based on her elevated Hgb A1c and was informed this puts her at greater risk of developing diabetes. She is taking metformin currently and continues to work on diet and exercise to decrease risk of diabetes. She denies nausea, polyphagia or hypoglycemia.  ALLERGIES: Allergies  Allergen Reactions  . Sulfa Antibiotics Anaphylaxis  . Pollen Extract     MEDICATIONS: Current Outpatient Medications on File Prior to Visit  Medication Sig Dispense Refill  . aspirin EC 81 MG tablet Take 81 mg by mouth daily.    . Calcium Carbonate-Vitamin D3 (CALCIUM 600-D) 600-400 MG-UNIT TABS Takes 2 tablets daily.    Mariane Baumgarten Calcium (STOOL SOFTENER PO) Take by mouth daily as needed.    . loratadine (CLARITIN) 10 MG tablet Take 10 mg by mouth daily.    . metFORMIN (GLUCOPHAGE) 500 MG tablet Take 1 tablet (500 mg total) by mouth daily with breakfast. 30 tablet 0  . Multiple Vitamin (MULTIVITAMIN) tablet Take 1 tablet by mouth daily.    . Omega-3 Fatty Acids  (FISH OIL) 1000 MG CAPS 2 capsules daily.    [name] omeprazole (PRILOSEC) 10 MG capsule Take 20 mg by mouth daily.     . polyethylene glycol powder (GLYCOLAX/MIRALAX) powder Take 1 Container by mouth daily.    . Vitamin D, Ergocalciferol, (DRISDOL) 50000 units CAPS capsule Take 1 capsule (50,000 Units total) by mouth every 7 (seven) days. 4 capsule 0   No current facility-administered medications on file prior to visit.     PAST MEDICAL HISTORY: Past Medical History:  Diagnosis Date  . Abnormal Pap smear of cervix    age 57  . Abnormal uterine bleeding   . Allergy   . Chronic constipation   . Depression   . Endometriosis   . GERD (gastroesophageal reflux disease)   . Hemorrhoids   . Hyperlipidemia   . Obesity   . Osteopenia     PAST SURGICAL HISTORY: Past Surgical History:  Procedure Laterality Date  . ABDOMINOPLASTY  2006   Dr. Lesli Albee  . ABDOMINOPLASTY  2006  . BREAST REDUCTION SURGERY  2006  . [place] OF UTERUS  2016  . FRACTURE SURGERY  1972   Arm   . GYNECOLOGIC CRYOSURGERY     at age 57  . HYSTEROSCOPY  2006    SOCIAL HISTORY: Social History   Tobacco Use  . Smoking status: Never Smoker  . Smokeless tobacco: Never Used  Substance Use Topics  .  Alcohol use: No  . Drug use: No    FAMILY HISTORY: Family History  Problem Relation Age of Onset  . Hyperlipidemia Mother   . Stroke Mother   . Diabetes Mother   . Hypertension Mother   . Kidney disease Mother   . Thyroid disease Mother   . Eating disorder Mother   . Obesity Mother   . Arthritis Father   . Heart disease Father   . Sudden death Father   . Hyperlipidemia Maternal Grandmother   . Heart disease Maternal Grandmother   . Mental illness Maternal Grandmother   . Alcohol abuse Maternal Grandfather   . Arthritis Paternal Grandmother   . Hyperlipidemia Paternal Grandmother   . Heart disease Paternal Grandmother   . Alcohol abuse Paternal Grandfather     ROS: Review of Systems    Constitutional: Positive for weight loss.  Gastrointestinal: Negative for nausea and vomiting.  Musculoskeletal:       Negative for muscle weakness  Endo/Heme/Allergies:       Negative for polyphagia Negative for hypoglycemia    PHYSICAL EXAM: Blood pressure 121/82, pulse 64, height 5\' 3"  (1.6 m), weight 175 lb (79.4 kg), last menstrual period 01/01/2014, SpO2 100 %. Body mass index is 31 kg/m. Physical Exam  Constitutional: She is oriented to person, place, and time. She appears well-developed and well-nourished.  Cardiovascular: Normal rate.  Pulmonary/Chest: Effort normal.  Musculoskeletal: Normal range of motion.  Neurological: She is oriented to person, place, and time.  Skin: Skin is warm and dry.  Psychiatric: She has a normal mood and affect. Her behavior is normal.  Vitals reviewed.   RECENT LABS AND TESTS: BMET    Component Value Date/Time   NA 140 03/26/2017 1157   K 4.7 03/26/2017 1157   CL 102 03/26/2017 1157   CO2 24 03/26/2017 1157   GLUCOSE 104 (H) 03/26/2017 1157   BUN 20 03/26/2017 1157   CREATININE 0.71 03/26/2017 1157   CALCIUM 9.3 03/26/2017 1157   GFRNONAA 94 03/26/2017 1157   GFRAA 109 03/26/2017 1157   Lab Results  Component Value Date   HGBA1C 5.7 (H) 03/26/2017   Lab Results  Component Value Date   INSULIN 7.1 03/26/2017   CBC    Component Value Date/Time   WBC 7.3 03/26/2017 1157   RBC 4.88 03/26/2017 1157   HGB 14.6 03/26/2017 1157   HCT 44.6 03/26/2017 1157   MCV 91 03/26/2017 1157   MCH 29.9 03/26/2017 1157   MCHC 32.7 03/26/2017 1157   RDW 13.2 03/26/2017 1157   LYMPHSABS 2.7 03/26/2017 1157   EOSABS 0.2 03/26/2017 1157   BASOSABS 0.0 03/26/2017 1157   Iron/TIBC/Ferritin/ %Sat No results found for: IRON, TIBC, FERRITIN, IRONPCTSAT Lipid Panel     Component Value Date/Time   CHOL 209 (H) 03/26/2017 1157   TRIG 83 03/26/2017 1157   HDL 74 03/26/2017 1157   LDLCALC 118 (H) 03/26/2017 1157   Hepatic Function Panel      Component Value Date/Time   PROT 6.9 03/26/2017 1157   ALBUMIN 4.7 03/26/2017 1157   AST 16 03/26/2017 1157   ALT 19 03/26/2017 1157   ALKPHOS 92 03/26/2017 1157   BILITOT 0.4 03/26/2017 1157      Component Value Date/Time   TSH 2.920 03/26/2017 1157   Results for Ottaviano, Jacquelynn (MRN [id number]) as of 07/17/2017 08:03  Ref. Range 03/26/2017 11:57  Vitamin D, 25-Hydroxy Latest Ref Range: 30.0 - 100.0 ng/mL 31.7   ASSESSMENT AND PLAN: Vitamin  D deficiency - Plan: Vitamin D, Ergocalciferol, (DRISDOL) 50000 units CAPS capsule  Prediabetes - Plan: metFORMIN (GLUCOPHAGE) 500 MG tablet  At risk for osteoporosis  Class 1 obesity with serious comorbidity and body mass index (BMI) of 31.0 to 31.9 in adult, unspecified obesity type  PLAN:  Vitamin D Deficiency Tina Chavez was informed that low vitamin D levels contributes to fatigue and are associated with obesity, breast, and colon cancer. She agrees to continue to take prescription Vit D @50 ,000 IU every week #4 with no refills and will follow up for routine testing of vitamin D, at least 2-3 times per year. She was informed of the risk of over-replacement of vitamin D and agrees to not increase her dose unless she discusses this with [place] first. Tina Chavez agrees to follow up as directed.  At risk for osteopenia and osteoporosis Tina Chavez is at risk for osteopenia and osteoporosis due to her vitamin D deficiency. She was encouraged to take her vitamin D and follow her higher calcium diet and increase strengthening exercise to help strengthen her bones and decrease her risk of osteopenia and osteoporosis.  Pre-Diabetes Tina Chavez will continue to work on weight loss, exercise, and decreasing simple carbohydrates in her diet to help decrease the risk of diabetes. We dicussed metformin including benefits and risks. She was informed that eating too many simple carbohydrates or too many calories at one sitting increases the likelihood of GI side effects. Tina Chavez  requested metformin for now and a prescription was written today for 1 month refill. Tina Chavez agreed to follow up with [place] as directed to monitor her progress.  Obesity Tina Chavez is currently in the action stage of change. As such, her goal is to continue with weight loss efforts She has agreed to portion control better and make smarter food choices, such as increase vegetables and decrease simple carbohydrates  Tina Chavez has been instructed to work up to a goal of 150 minutes of combined cardio and strengthening exercise per week for weight loss and overall health benefits. We discussed the following Behavioral Modification Strategies today: travel eating strategies  and celebration eating strategies  Tina Chavez has agreed to follow up with our clinic in 3 weeks. She was informed of the importance of frequent follow up visits to maximize her success with intensive lifestyle modifications for her multiple health conditions.   OBESITY BEHAVIORAL INTERVENTION VISIT  Today's visit was # 7 out of 22.  Starting weight: 174 lbs Starting date: 03/26/17 Today's weight : 175 lbs  Today's date: 07/16/2017 Total lbs lost to date: 0 (Patients must lose 7 lbs in the first 6 months to continue with counseling)   ASK: We discussed the diagnosis of obesity with Tina Chavez today and Tina Chavez agreed to give [place] permission to discuss obesity behavioral modification therapy today.  ASSESS: Tina Chavez has the diagnosis of obesity and her BMI today is 31.01 Tina Chavez is in the action stage of change   ADVISE: Tina Chavez was educated on the multiple health risks of obesity as well as the benefit of weight loss to improve her health. She was advised of the need for long term treatment and the importance of lifestyle modifications.  AGREE: Multiple dietary modification options and treatment options were discussed and  Tina Chavez agreed to the above obesity treatment plan.   Corey Skains, am acting as transcriptionist for [employer],  PA-C I, Lacy Duverney [facility], have reviewed this note and agree with its content

## 2017-07-31 MED FILL — diazePAM 10 MG TABS: 10 | 1 days supply | Qty: 1 | Fill #0

## 2017-08-01 MED FILL — metFORMIN HCL 500 MG TABS: 500 | 30 days supply | Qty: 30 | Fill #0

## 2017-08-06 ENCOUNTER — Ambulatory Visit (INDEPENDENT_AMBULATORY_CARE_PROVIDER_SITE_OTHER): Payer: No Typology Code available for payment source | Admitting: Family Medicine

## 2017-08-06 VITALS — BP 106/70 | HR 71 | Temp 98.1°F | Ht 63.0 in | Wt 174.0 lb

## 2017-08-06 DIAGNOSIS — E559 Vitamin D deficiency, unspecified: Secondary | ICD-10-CM

## 2017-08-06 DIAGNOSIS — Z6830 Body mass index (BMI) 30.0-30.9, adult: Secondary | ICD-10-CM

## 2017-08-06 DIAGNOSIS — E669 Obesity, unspecified: Secondary | ICD-10-CM

## 2017-08-06 DIAGNOSIS — Z9189 Other specified personal risk factors, not elsewhere classified: Secondary | ICD-10-CM | POA: Diagnosis not present

## 2017-08-06 MED ORDER — VITAMIN D (ERGOCALCIFEROL) 1.25 MG (50000 UNIT) PO CAPS: 50000.0000 [IU] | ORAL_CAPSULE | ORAL | 0 refills | Status: DC

## 2017-08-06 MED FILL — VIT D2 1.25 MG (50,000 UNIT: 1.25 MG | 28 days supply | Qty: 4 | Fill #0

## 2017-08-06 NOTE — Progress Notes (Signed)
Office: [phone]  /  Fax: [phone]   HPI:   Chief Complaint: OBESITY Tina Chavez is here to discuss her progress with her obesity treatment plan. She is on the portion control better and make smarter food choices, such as increase vegetables and decrease simple carbohydrates and is following her eating plan approximately 100 % of the time. She states she is walking for 45 minutes 4-6 times per week. Tina Chavez is doing better with journaling and weight loss. She is trying to increase exercise and is getting ready to go on a cruise to [place].  Her weight is 174 lb (78.9 kg) today and has had a weight loss of 1 pound over a period of 3 weeks since her last visit. She has lost 0 lbs since starting treatment with [place].  Vitamin D Deficiency Tina Chavez has a diagnosis of vitamin D deficiency. She is stable on prescription Vit D, not yet at goal. She denies nausea, vomiting or muscle weakness.  At risk for osteopenia and osteoporosis Tina Chavez is at higher risk of osteopenia and osteoporosis due to vitamin D deficiency.   ALLERGIES: Allergies  Allergen Reactions  . Sulfa Antibiotics Anaphylaxis  . Pollen Extract     MEDICATIONS: Current Outpatient Medications on File Prior to Visit  Medication Sig Dispense Refill  . aspirin EC 81 MG tablet Take 81 mg by mouth daily.    . Calcium Carbonate-Vitamin D3 (CALCIUM 600-D) 600-400 MG-UNIT TABS Takes 2 tablets daily.    Tina Chavez Calcium (STOOL SOFTENER PO) Take by mouth daily as needed.    . loratadine (CLARITIN) 10 MG tablet Take 10 mg by mouth daily.    . metFORMIN (GLUCOPHAGE) 500 MG tablet Take 1 tablet (500 mg total) by mouth daily with breakfast. 30 tablet 0  . Multiple Vitamin (MULTIVITAMIN) tablet Take 1 tablet by mouth daily.    . Omega-3 Fatty Acids (FISH OIL) 1000 MG CAPS 2 capsules daily.    Tina Chavez Kitchen omeprazole (PRILOSEC) 10 MG capsule Take 20 mg by mouth daily.     . polyethylene glycol powder (GLYCOLAX/MIRALAX) powder Take 1 Container by mouth  daily.     No current facility-administered medications on file prior to visit.     PAST MEDICAL HISTORY: Past Medical History:  Diagnosis Date  . Abnormal Pap smear of cervix    age 66  . Abnormal uterine bleeding   . Allergy   . Chronic constipation   . Depression   . Endometriosis   . GERD (gastroesophageal reflux disease)   . Hemorrhoids   . Hyperlipidemia   . Obesity   . Osteopenia     PAST SURGICAL HISTORY: Past Surgical History:  Procedure Laterality Date  . ABDOMINOPLASTY  2006   Dr. Lesli Albee  . ABDOMINOPLASTY  2006  . BREAST REDUCTION SURGERY  2006  . [place] OF UTERUS  2016  . FRACTURE SURGERY  1972   Arm   . GYNECOLOGIC CRYOSURGERY     at age 63  . HYSTEROSCOPY  2006    SOCIAL HISTORY: Social History   Tobacco Use  . Smoking status: Never Smoker  . Smokeless tobacco: Never Used  Substance Use Topics  . Alcohol use: No  . Drug use: No    FAMILY HISTORY: Family History  Problem Relation Age of Onset  . Hyperlipidemia Mother   . Stroke Mother   . Diabetes Mother   . Hypertension Mother   . Kidney disease Mother   . Thyroid disease Mother   . Eating  disorder Mother   . Obesity Mother   . Arthritis Father   . Heart disease Father   . Sudden death Father   . Hyperlipidemia Maternal Grandmother   . Heart disease Maternal Grandmother   . Mental illness Maternal Grandmother   . Alcohol abuse Maternal Grandfather   . Arthritis Paternal Grandmother   . Hyperlipidemia Paternal Grandmother   . Heart disease Paternal Grandmother   . Alcohol abuse Paternal Grandfather     ROS: Review of Systems  Constitutional: Positive for weight loss.  Gastrointestinal: Negative for nausea and vomiting.  Musculoskeletal:       Negative muscle weakness    PHYSICAL EXAM: Blood pressure 106/70, pulse 71, temperature 98.1 F (36.7 C), temperature source Oral, height 5\' 3"  (1.6 m), weight 174 lb (78.9 kg), last menstrual period 01/01/2014, SpO2  100 %. Body mass index is 30.82 kg/m. Physical Exam  Constitutional: She is oriented to person, place, and time. She appears well-developed and well-nourished.  Cardiovascular: Normal rate.  Pulmonary/Chest: Effort normal.  Musculoskeletal: Normal range of motion.  Neurological: She is oriented to person, place, and time.  Skin: Skin is warm and dry.  Psychiatric: She has a normal mood and affect. Her behavior is normal.  Vitals reviewed.   RECENT LABS AND TESTS: BMET    Component Value Date/Time   NA 140 03/26/2017 1157   K 4.7 03/26/2017 1157   CL 102 03/26/2017 1157   CO2 24 03/26/2017 1157   GLUCOSE 104 (H) 03/26/2017 1157   BUN 20 03/26/2017 1157   CREATININE 0.71 03/26/2017 1157   CALCIUM 9.3 03/26/2017 1157   GFRNONAA 94 03/26/2017 1157   GFRAA 109 03/26/2017 1157   Lab Results  Component Value Date   HGBA1C 5.7 (H) 03/26/2017   Lab Results  Component Value Date   INSULIN 7.1 03/26/2017   CBC    Component Value Date/Time   WBC 7.3 03/26/2017 1157   RBC 4.88 03/26/2017 1157   HGB 14.6 03/26/2017 1157   HCT 44.6 03/26/2017 1157   MCV 91 03/26/2017 1157   MCH 29.9 03/26/2017 1157   MCHC 32.7 03/26/2017 1157   RDW 13.2 03/26/2017 1157   LYMPHSABS 2.7 03/26/2017 1157   EOSABS 0.2 03/26/2017 1157   BASOSABS 0.0 03/26/2017 1157   Iron/TIBC/Ferritin/ %Sat No results found for: IRON, TIBC, FERRITIN, IRONPCTSAT Lipid Panel     Component Value Date/Time   CHOL 209 (H) 03/26/2017 1157   TRIG 83 03/26/2017 1157   HDL 74 03/26/2017 1157   LDLCALC 118 (H) 03/26/2017 1157   Hepatic Function Panel     Component Value Date/Time   PROT 6.9 03/26/2017 1157   ALBUMIN 4.7 03/26/2017 1157   AST 16 03/26/2017 1157   ALT 19 03/26/2017 1157   ALKPHOS 92 03/26/2017 1157   BILITOT 0.4 03/26/2017 1157      Component Value Date/Time   TSH 2.920 03/26/2017 1157  Results for Paez, Tanazia (MRN [id number]) as of 08/06/2017 12:34  Ref. Range 03/26/2017 11:57  Vitamin D,  25-Hydroxy Latest Ref Range: 30.0 - 100.0 ng/mL 31.7    ASSESSMENT AND PLAN: Vitamin D deficiency - Plan: Vitamin D, Ergocalciferol, (DRISDOL) 50000 units CAPS capsule  At risk for osteoporosis  Class 1 obesity with serious comorbidity and body mass index (BMI) of 30.0 to 30.9 in adult, unspecified obesity type  PLAN:  Vitamin D Deficiency Tina Chavez was informed that low vitamin D levels contributes to fatigue and are associated with obesity, breast, and colon cancer.  Tina Chavez agrees to continue taking prescription Vit D @50 ,000 IU every week #4 and we will refill for 1 month. She will follow up for routine testing of vitamin D, at least 2-3 times per year. She was informed of the risk of over-replacement of vitamin D and agrees to not increase her dose unless she discusses this with [place] first. Tina Chavez agrees to follow up with our clinic in 3 to 4 weeks.  At risk for osteopenia and osteoporosis Tina Chavez is at risk for osteopenia and osteoporsis due to her vitamin D deficiency. She was encouraged to take her vitamin D and follow her higher calcium diet and increase strengthening exercise to help strengthen her bones and decrease her risk of osteopenia and osteoporosis.  Obesity Tina Chavez is currently in the action stage of change. As such, her goal is to maintain weight for now She has agreed to keep a food journal with 1200 calories and 70+ grams of protein daily Tina Chavez has been instructed to work up to a goal of 150 minutes of combined cardio and strengthening exercise per week for weight loss and overall health benefits. We discussed the following Behavioral Modification Strategies today: holiday eating strategies and travel eating strategies We discussed goal to maintain weight on vacation.  Tina Chavez has agreed to follow up with our clinic in 3 to 4 weeks. She was informed of the importance of frequent follow up visits to maximize her success with intensive lifestyle modifications for her multiple health  conditions.   OBESITY BEHAVIORAL INTERVENTION VISIT  Today's visit was # 8 out of 22.  Starting weight: 174 lbs Starting date: 03/26/17 Today's weight : 174 lbs  Today's date: 08/06/2017 Total lbs lost to date: 0    ASK: We discussed the diagnosis of obesity with Tina Chavez today and Tina Chavez agreed to give [place] permission to discuss obesity behavioral modification therapy today.  ASSESS: Tina Chavez has the diagnosis of obesity and her BMI today is 30.83 Tina Chavez is in the action stage of change   ADVISE: Tina Chavez was educated on the multiple health risks of obesity as well as the benefit of weight loss to improve her health. She was advised of the need for long term treatment and the importance of lifestyle modifications.  AGREE: Multiple dietary modification options and treatment options were discussed and  Tina Chavez agreed to the above obesity treatment plan.  I, Trixie Dredge, am acting as transcriptionist for Dennard Nip, MD  I have reviewed the above documentation for accuracy and completeness, and I agree with the above. -Dennard Nip, MD

## 2017-08-27 ENCOUNTER — Ambulatory Visit (INDEPENDENT_AMBULATORY_CARE_PROVIDER_SITE_OTHER): Payer: No Typology Code available for payment source | Admitting: Family Medicine

## 2017-08-27 VITALS — BP 112/71 | HR 82 | Ht 63.0 in | Wt 176.0 lb

## 2017-08-27 DIAGNOSIS — R7303 Prediabetes: Secondary | ICD-10-CM

## 2017-08-27 DIAGNOSIS — Z9189 Other specified personal risk factors, not elsewhere classified: Secondary | ICD-10-CM | POA: Diagnosis not present

## 2017-08-27 DIAGNOSIS — Z6831 Body mass index (BMI) 31.0-31.9, adult: Secondary | ICD-10-CM

## 2017-08-27 DIAGNOSIS — F3289 Other specified depressive episodes: Secondary | ICD-10-CM | POA: Diagnosis not present

## 2017-08-27 DIAGNOSIS — E669 Obesity, unspecified: Secondary | ICD-10-CM

## 2017-08-27 DIAGNOSIS — E559 Vitamin D deficiency, unspecified: Secondary | ICD-10-CM

## 2017-08-27 DIAGNOSIS — E66811 Obesity, class 1: Secondary | ICD-10-CM

## 2017-08-27 MED ORDER — BUPROPION HCL ER (SR) 150 MG PO TB12: 150.0000 mg | ORAL_TABLET | Freq: Every day | ORAL | 0 refills | Status: DC

## 2017-08-27 MED ORDER — METFORMIN HCL 500 MG PO TABS: 500.0000 mg | ORAL_TABLET | Freq: Every day | ORAL | 0 refills | Status: DC

## 2017-08-27 MED ORDER — VITAMIN D (ERGOCALCIFEROL) 1.25 MG (50000 UNIT) PO CAPS: 50000.0000 [IU] | ORAL_CAPSULE | ORAL | 0 refills | Status: DC

## 2017-08-27 MED FILL — VIT D2 1.25 MG (50,000 UNIT: 1.25 MG | 28 days supply | Qty: 4 | Fill #0

## 2017-08-28 MED FILL — BUPROPION SR 150 MG TABLET: 150 | 30 days supply | Qty: 30 | Fill #0

## 2017-08-28 NOTE — Progress Notes (Signed)
Office: [phone]  /  Fax: [phone]   HPI:   Chief Complaint: OBESITY Tina Chavez is here to discuss her progress with her obesity treatment plan. She is on the keep a food journal with 1200 calories and 70+ grams of protein daily and is following her eating plan approximately 25 % of the time. She states she is walking 45 minutes 2 to 4 times per week. [name] just got back from an [place] and she is ready to get back on trak. [name] notes an increase in cravings, especially at night for sweets. Her weight is 176 lb (79.8 kg) today and has not lost weight since her last visit. She has lost 0 lbs since starting treatment with [place].  Pre-Diabetes [name] has a diagnosis of prediabetes based on her elevated Hgb A1c and was informed this puts her at greater risk of developing diabetes. She is stable on metformin currently. [name] struggled to follow her diet. [name] continues to work on diet and exercise to decrease risk of diabetes. She denies nausea, vomiting or hypoglycemia.  At risk for diabetes [name] is at higher than average risk for developing diabetes due to her obesity and prediabetes. She currently denies polyuria or polydipsia.  Vitamin D deficiency [name] has a diagnosis of vitamin D deficiency. She is stable on vit D and denies nausea, vomiting or muscle weakness.  Depression with emotional eating behaviors [name] has been on Wellbutrin in the past for depression and thought it was very useful. She is struggling with comfort eating in the night time. [name] struggles with emotional eating and using food for comfort to the extent that it is negatively impacting her health. She often snacks when she is not hungry. [name] sometimes feels she is out of control and then feels guilty that she made poor food choices. She has been working on behavior modification techniques to help reduce her emotional eating and has been somewhat successful. She shows no sign of suicidal or homicidal  ideations.  Depression screen PHQ 2/9 03/26/2017  Decreased Interest 1  Down, Depressed, Hopeless 0  PHQ - 2 Score 1  Altered sleeping 1  Tired, decreased energy 1  Change in appetite 1  Feeling bad or failure about yourself  0  Trouble concentrating 0  Moving slowly or fidgety/restless 0  Suicidal thoughts 0  PHQ-9 Score 4  Difficult doing work/chores Not difficult at all      ALLERGIES: Allergies  Allergen Reactions  . Sulfa Antibiotics Anaphylaxis  . Pollen Extract     MEDICATIONS: Current Outpatient Medications on File Prior to Visit  Medication Sig Dispense Refill  . aspirin EC 81 MG tablet Take 81 mg by mouth daily.    . Calcium Carbonate-Vitamin D3 (CALCIUM 600-D) 600-400 MG-UNIT TABS Takes 2 tablets daily.    Mariane Baumgarten Calcium (STOOL SOFTENER PO) Take by mouth daily as needed.    . loratadine (CLARITIN) 10 MG tablet Take 10 mg by mouth daily.    . Multiple Vitamin (MULTIVITAMIN) tablet Take 1 tablet by mouth daily.    . Omega-3 Fatty Acids (FISH OIL) 1000 MG CAPS 2 capsules daily.    [name] omeprazole (PRILOSEC) 10 MG capsule Take 20 mg by mouth daily.     . polyethylene glycol powder (GLYCOLAX/MIRALAX) powder Take 1 Container by mouth daily.     No current facility-administered medications on file prior to visit.     PAST MEDICAL HISTORY: Past Medical History:  Diagnosis Date  . Abnormal Pap smear of cervix  age 88  . Abnormal uterine bleeding   . Allergy   . Chronic constipation   . Depression   . Endometriosis   . GERD (gastroesophageal reflux disease)   . Hemorrhoids   . Hyperlipidemia   . Obesity   . Osteopenia     PAST SURGICAL HISTORY: Past Surgical History:  Procedure Laterality Date  . ABDOMINOPLASTY  2006   Dr. Lesli Albee  . ABDOMINOPLASTY  2006  . BREAST REDUCTION SURGERY  2006  . [place] OF UTERUS  2016  . FRACTURE SURGERY  1972   Arm   . GYNECOLOGIC CRYOSURGERY     at age 35  . HYSTEROSCOPY  2006    SOCIAL  HISTORY: Social History   Tobacco Use  . Smoking status: Never Smoker  . Smokeless tobacco: Never Used  Substance Use Topics  . Alcohol use: No  . Drug use: No    FAMILY HISTORY: Family History  Problem Relation Age of Onset  . Hyperlipidemia Mother   . Stroke Mother   . Diabetes Mother   . Hypertension Mother   . Kidney disease Mother   . Thyroid disease Mother   . Eating disorder Mother   . Obesity Mother   . Arthritis Father   . Heart disease Father   . Sudden death Father   . Hyperlipidemia Maternal Grandmother   . Heart disease Maternal Grandmother   . Mental illness Maternal Grandmother   . Alcohol abuse Maternal Grandfather   . Arthritis Paternal Grandmother   . Hyperlipidemia Paternal Grandmother   . Heart disease Paternal Grandmother   . Alcohol abuse Paternal Grandfather     ROS: Review of Systems  Constitutional: Negative for weight loss.  Gastrointestinal: Negative for nausea and vomiting.  Genitourinary: Negative for frequency.  Musculoskeletal:       Negative for muscle weakness  Endo/Heme/Allergies: Negative for polydipsia.       Negative for hypoglycemia Positive for cravings  Psychiatric/Behavioral: Positive for depression. Negative for suicidal ideas.    PHYSICAL EXAM: Blood pressure 112/71, pulse 82, height 5\' 3"  (1.6 m), weight 176 lb (79.8 kg), last menstrual period 01/01/2014, SpO2 98 %. Body mass index is 31.18 kg/m. Physical Exam  Constitutional: She is oriented to person, place, and time. She appears well-developed and well-nourished.  Cardiovascular: Normal rate.  Pulmonary/Chest: Effort normal.  Musculoskeletal: Normal range of motion.  Neurological: She is oriented to person, place, and time.  Skin: Skin is warm and dry.  Psychiatric: She has a normal mood and affect. Her behavior is normal.  Vitals reviewed.   RECENT LABS AND TESTS: BMET    Component Value Date/Time   NA 140 03/26/2017 1157   K 4.7 03/26/2017 1157   CL  102 03/26/2017 1157   CO2 24 03/26/2017 1157   GLUCOSE 104 (H) 03/26/2017 1157   BUN 20 03/26/2017 1157   CREATININE 0.71 03/26/2017 1157   CALCIUM 9.3 03/26/2017 1157   GFRNONAA 94 03/26/2017 1157   GFRAA 109 03/26/2017 1157   Lab Results  Component Value Date   HGBA1C 5.7 (H) 03/26/2017   Lab Results  Component Value Date   INSULIN 7.1 03/26/2017   CBC    Component Value Date/Time   WBC 7.3 03/26/2017 1157   RBC 4.88 03/26/2017 1157   HGB 14.6 03/26/2017 1157   HCT 44.6 03/26/2017 1157   MCV 91 03/26/2017 1157   MCH 29.9 03/26/2017 1157   MCHC 32.7 03/26/2017 1157   RDW 13.2 03/26/2017 1157  LYMPHSABS 2.7 03/26/2017 1157   EOSABS 0.2 03/26/2017 1157   BASOSABS 0.0 03/26/2017 1157   Iron/TIBC/Ferritin/ %Sat No results found for: IRON, TIBC, FERRITIN, IRONPCTSAT Lipid Panel     Component Value Date/Time   CHOL 209 (H) 03/26/2017 1157   TRIG 83 03/26/2017 1157   HDL 74 03/26/2017 1157   LDLCALC 118 (H) 03/26/2017 1157   Hepatic Function Panel     Component Value Date/Time   PROT 6.9 03/26/2017 1157   ALBUMIN 4.7 03/26/2017 1157   AST 16 03/26/2017 1157   ALT 19 03/26/2017 1157   ALKPHOS 92 03/26/2017 1157   BILITOT 0.4 03/26/2017 1157      Component Value Date/Time   TSH 2.920 03/26/2017 1157   Results for PRISEIS, CRATTY (MRN [id number]) as of 08/28/2017 12:33  Ref. Range 03/26/2017 11:57  Vitamin D, 25-Hydroxy Latest Ref Range: 30.0 - 100.0 ng/mL 31.7   ASSESSMENT AND PLAN: Vitamin D deficiency - Plan: Vitamin D, Ergocalciferol, (DRISDOL) 50000 units CAPS capsule  Prediabetes - Plan: metFORMIN (GLUCOPHAGE) 500 MG tablet  Other depression - with emotional eating - Plan: buPROPion (WELLBUTRIN SR) 150 MG 12 hr tablet  At risk for diabetes mellitus  Class 1 obesity with serious comorbidity and body mass index (BMI) of 31.0 to 31.9 in adult, unspecified obesity type  PLAN:  Pre-Diabetes Rachelanne will continue to work on weight loss, exercise, and  decreasing simple carbohydrates in her diet to help decrease the risk of diabetes. We dicussed metformin including benefits and risks. She was informed that eating too many simple carbohydrates or too many calories at one sitting increases the likelihood of GI side effects. Apryll requested metformin for now and a prescription was written today for 1 month refill. Noga agreed to follow up with [place] as directed to monitor her progress.  Diabetes risk counseling Camdyn was given extended (15 minutes) diabetes prevention counseling today. She is 57 y.o. female and has risk factors for diabetes including obesity and prediabetes. We discussed intensive lifestyle modifications today with an emphasis on weight loss as well as increasing exercise and decreasing simple carbohydrates in her diet.  Vitamin D Deficiency Krystiana was informed that low vitamin D levels contributes to fatigue and are associated with obesity, breast, and colon cancer. She agrees to continue to take prescription Vit D @50 ,000 IU every week #4 with no refills and will follow up for routine testing of vitamin D, at least 2-3 times per year. She was informed of the risk of over-replacement of vitamin D and agrees to not increase her dose unless she discusses this with [place] first. Margaretmary agrees to follow up as directed.  Depression with Emotional Eating Behaviors We discussed behavior modification techniques today to help Finesse deal with her emotional eating and depression. She has agreed to start Wellbutrin SR 150 mg qAM #30 with no refills and follow up as directed.  Obesity Nyala is currently in the action stage of change. As such, her goal is to continue with weight loss efforts She has agreed to keep a food journal with 1200 calories and 70+ grams of protein daily Cincere has been instructed to work up to a goal of 150 minutes of combined cardio and strengthening exercise per week for weight loss and overall health benefits. We discussed the  following Behavioral Modification Strategies today: increasing lean protein intake and work on meal planning and easy cooking plans  Our registered dietician Paige, discussed a variety of food options to help Shawna stay at goal.  Seren has agreed to follow up with our clinic in 4 weeks. She was informed of the importance of frequent follow up visits to maximize her success with intensive lifestyle modifications for her multiple health conditions.   OBESITY BEHAVIORAL INTERVENTION VISIT  Today's visit was # 9   Starting weight: 174 lbs Starting date: 03/26/17 Today's weight : 176 lbs  Today's date: 08/27/2017 Total lbs lost to date: 0   ASK: We discussed the diagnosis of obesity with Moneisha Darnell today and Storm agreed to give [place] permission to discuss obesity behavioral modification therapy today.  ASSESS: Zeynep has the diagnosis of obesity and her BMI today is 31.18 Xylia is in the action stage of change   ADVISE: Daniele was educated on the multiple health risks of obesity as well as the benefit of weight loss to improve her health. She was advised of the need for long term treatment and the importance of lifestyle modifications to improve her current health and to decrease her risk of future health problems.  AGREE: Multiple dietary modification options and treatment options were discussed and  Jalynne agreed to follow the recommendations documented in the above note.  ARRANGE: Crucita was educated on the importance of frequent visits to treat obesity as outlined per CMS and USPSTF guidelines and agreed to schedule her next follow up appointment today.  I, Doreene Nest, am acting as transcriptionist for Dennard Nip, MD  I have reviewed the above documentation for accuracy and completeness, and I agree with the above. -Dennard Nip, MD

## 2017-09-05 MED FILL — metFORMIN HCL 500 MG TABS: 500 | 30 days supply | Qty: 30 | Fill #0

## 2017-09-23 ENCOUNTER — Ambulatory Visit (INDEPENDENT_AMBULATORY_CARE_PROVIDER_SITE_OTHER): Payer: No Typology Code available for payment source | Admitting: Family Medicine

## 2017-09-23 VITALS — BP 121/80 | HR 70 | Temp 98.2°F | Ht 63.0 in | Wt 177.0 lb

## 2017-09-23 DIAGNOSIS — E669 Obesity, unspecified: Secondary | ICD-10-CM | POA: Diagnosis not present

## 2017-09-23 DIAGNOSIS — E559 Vitamin D deficiency, unspecified: Secondary | ICD-10-CM

## 2017-09-23 DIAGNOSIS — Z6831 Body mass index (BMI) 31.0-31.9, adult: Secondary | ICD-10-CM

## 2017-09-23 DIAGNOSIS — R7303 Prediabetes: Secondary | ICD-10-CM

## 2017-09-23 DIAGNOSIS — F3289 Other specified depressive episodes: Secondary | ICD-10-CM

## 2017-09-23 DIAGNOSIS — Z9189 Other specified personal risk factors, not elsewhere classified: Secondary | ICD-10-CM | POA: Diagnosis not present

## 2017-09-23 MED ORDER — LIRAGLUTIDE -WEIGHT MANAGEMENT 18 MG/3ML ~~LOC~~ SOPN: 3.0000 mg | PEN_INJECTOR | Freq: Every day | SUBCUTANEOUS | 0 refills | Status: DC

## 2017-09-23 MED ORDER — VITAMIN D (ERGOCALCIFEROL) 1.25 MG (50000 UNIT) PO CAPS: 50000.0000 [IU] | ORAL_CAPSULE | ORAL | 0 refills | Status: DC

## 2017-09-23 MED ORDER — BUPROPION HCL ER (SR) 150 MG PO TB12: 150.0000 mg | ORAL_TABLET | Freq: Every day | ORAL | 0 refills | Status: DC

## 2017-09-23 MED ORDER — INSULIN PEN NEEDLE 32G X 4 MM MISC: 1.0000 | Freq: Every day | 0 refills | Status: DC

## 2017-09-23 MED ORDER — METFORMIN HCL 500 MG PO TABS: 500.0000 mg | ORAL_TABLET | Freq: Every day | ORAL | 0 refills | Status: DC

## 2017-09-24 MED FILL — VIT D2 1.25 MG (50,000 UNIT: 1.25 MG | 28 days supply | Qty: 4 | Fill #0

## 2017-09-24 MED FILL — BUPROPION SR 150 MG TABLET: 150 | 30 days supply | Qty: 30 | Fill #0

## 2017-09-24 NOTE — Progress Notes (Signed)
Office: [phone]  /  Fax: [phone]   HPI:   Chief Complaint: OBESITY Tina Chavez is here to discuss her progress with her obesity treatment plan. She is on the  keep a food journal with 1000-1200 calories and 70g of  protein  and is following her eating plan approximately 90 % of the time. She states she is exercising by walking for 45 minutes 3-4 times per week. Tina Chavez is currently struggling to lose weight. She states is journal strictly most of the time and meeting her calorie and protein goals but is still not losing. Her last RMR was tested to be approximately 1500 so this does not make sense.  Her weight is 177 lb (80.3 kg) today and has not lost weight since her last visit. She has lost 0 lbs since starting treatment with [place].  Pre-Diabetes Tina Chavez has a diagnosis of prediabetes based on her elevated HgA1c and was informed this puts her at greater risk of developing diabetes. She is taking metformin currently and continues to work on diet and exercise to decrease risk of diabetes. She denies nausea, polyphagia or hypoglycemia.  Vitamin D deficiency Tina Chavez has a diagnosis of vitamin D deficiency. She is currently taking vit D and denies nausea, vomiting or muscle weakness.  Depression with emotional eating behaviors Tina Chavez is struggling with emotional eating and using food for comfort to the extent that it is negatively impacting her health. She often snacks when she is not hungry. Tina Chavez sometimes feels she is out of control and then feels guilty that she made poor food choices. She has been working on behavior modification techniques to help reduce her emotional eating and has been somewhat successful. Her mood is stable and she notes decrease in craving and emotional eating. Her blood pressure is stable and she denies insomnia. She shows no sign of suicidal or homicidal ideations.  Depression screen PHQ 2/9 03/26/2017  Decreased Interest 1  Down, Depressed, Hopeless 0  PHQ - 2 Score 1    Altered sleeping 1  Tired, decreased energy 1  Change in appetite 1  Feeling bad or failure about yourself  0  Trouble concentrating 0  Moving slowly or fidgety/restless 0  Suicidal thoughts 0  PHQ-9 Score 4  Difficult doing work/chores Not difficult at all   At risk for diabetes Tina Chavez is at higher than averagerisk for developing diabetes due to her obesity. She currently denies polyuria or polydipsia.  ALLERGIES: Allergies  Allergen Reactions  . Sulfa Antibiotics Anaphylaxis  . Pollen Extract     MEDICATIONS: Current Outpatient Medications on File Prior to Visit  Medication Sig Dispense Refill  . aspirin EC 81 MG tablet Take 81 mg by mouth daily.    . Calcium Carbonate-Vitamin D3 (CALCIUM 600-D) 600-400 MG-UNIT TABS Takes 2 tablets daily.    Tina Chavez Calcium (STOOL SOFTENER PO) Take by mouth daily as needed.    . loratadine (CLARITIN) 10 MG tablet Take 10 mg by mouth daily.    . Multiple Vitamin (MULTIVITAMIN) tablet Take 1 tablet by mouth daily.    . Omega-3 Fatty Acids (FISH OIL) 1000 MG CAPS 2 capsules daily.    Tina Chavez Kitchen omeprazole (PRILOSEC) 10 MG capsule Take 20 mg by mouth daily.     . polyethylene glycol powder (GLYCOLAX/MIRALAX) powder Take 1 Container by mouth daily.     No current facility-administered medications on file prior to visit.     PAST MEDICAL HISTORY: Past Medical History:  Diagnosis Date  . Abnormal Pap smear  of cervix    age 65  . Abnormal uterine bleeding   . Allergy   . Chronic constipation   . Depression   . Endometriosis   . GERD (gastroesophageal reflux disease)   . Hemorrhoids   . Hyperlipidemia   . Obesity   . Osteopenia     PAST SURGICAL HISTORY: Past Surgical History:  Procedure Laterality Date  . ABDOMINOPLASTY  2006   Dr. Lesli Albee  . ABDOMINOPLASTY  2006  . BREAST REDUCTION SURGERY  2006  . [place] OF UTERUS  2016  . FRACTURE SURGERY  1972   Arm   . GYNECOLOGIC CRYOSURGERY     at age 6  . HYSTEROSCOPY   2006    SOCIAL HISTORY: Social History   Tobacco Use  . Smoking status: Never Smoker  . Smokeless tobacco: Never Used  Substance Use Topics  . Alcohol use: No  . Drug use: No    FAMILY HISTORY: Family History  Problem Relation Age of Onset  . Hyperlipidemia Mother   . Stroke Mother   . Diabetes Mother   . Hypertension Mother   . Kidney disease Mother   . Thyroid disease Mother   . Eating disorder Mother   . Obesity Mother   . Arthritis Father   . Heart disease Father   . Sudden death Father   . Hyperlipidemia Maternal Grandmother   . Heart disease Maternal Grandmother   . Mental illness Maternal Grandmother   . Alcohol abuse Maternal Grandfather   . Arthritis Paternal Grandmother   . Hyperlipidemia Paternal Grandmother   . Heart disease Paternal Grandmother   . Alcohol abuse Paternal Grandfather     ROS: Review of Systems  Constitutional: Negative for weight loss.  Gastrointestinal: Negative for nausea and vomiting.  Musculoskeletal:       Negative for muscle weakness  Endo/Heme/Allergies: Negative for polydipsia.       Negative for polyuria and polyphagia Negative for hypoglycemia  Psychiatric/Behavioral: Positive for depression. Negative for suicidal ideas.       Negative for homicidal ideations     PHYSICAL EXAM: Blood pressure 121/80, pulse 70, temperature 98.2 F (36.8 C), temperature source Oral, height 5\' 3"  (1.6 m), weight 177 lb (80.3 kg), last menstrual period 01/01/2014, SpO2 96 %. Body mass index is 31.35 kg/m. Physical Exam  Constitutional: She is oriented to person, place, and time. She appears well-developed and well-nourished.  HENT:  Head: Normocephalic.  Cardiovascular: Normal rate.  Pulmonary/Chest: Effort normal.  Musculoskeletal: Normal range of motion.  Neurological: She is oriented to person, place, and time.  Skin: Skin is warm and dry.  Psychiatric: She has a normal mood and affect. Her behavior is normal.  Vitals  reviewed.   RECENT LABS AND TESTS: BMET    Component Value Date/Time   NA 140 03/26/2017 1157   K 4.7 03/26/2017 1157   CL 102 03/26/2017 1157   CO2 24 03/26/2017 1157   GLUCOSE 104 (H) 03/26/2017 1157   BUN 20 03/26/2017 1157   CREATININE 0.71 03/26/2017 1157   CALCIUM 9.3 03/26/2017 1157   GFRNONAA 94 03/26/2017 1157   GFRAA 109 03/26/2017 1157   Lab Results  Component Value Date   HGBA1C 5.7 (H) 03/26/2017   Lab Results  Component Value Date   INSULIN 7.1 03/26/2017   CBC    Component Value Date/Time   WBC 7.3 03/26/2017 1157   RBC 4.88 03/26/2017 1157   HGB 14.6 03/26/2017 1157   HCT 44.6  03/26/2017 1157   MCV 91 03/26/2017 1157   MCH 29.9 03/26/2017 1157   MCHC 32.7 03/26/2017 1157   RDW 13.2 03/26/2017 1157   LYMPHSABS 2.7 03/26/2017 1157   EOSABS 0.2 03/26/2017 1157   BASOSABS 0.0 03/26/2017 1157   Iron/TIBC/Ferritin/ %Sat No results found for: IRON, TIBC, FERRITIN, IRONPCTSAT Lipid Panel     Component Value Date/Time   CHOL 209 (H) 03/26/2017 1157   TRIG 83 03/26/2017 1157   HDL 74 03/26/2017 1157   LDLCALC 118 (H) 03/26/2017 1157   Hepatic Function Panel     Component Value Date/Time   PROT 6.9 03/26/2017 1157   ALBUMIN 4.7 03/26/2017 1157   AST 16 03/26/2017 1157   ALT 19 03/26/2017 1157   ALKPHOS 92 03/26/2017 1157   BILITOT 0.4 03/26/2017 1157      Component Value Date/Time   TSH 2.920 03/26/2017 1157    ASSESSMENT AND PLAN: Prediabetes - Plan: metFORMIN (GLUCOPHAGE) 500 MG tablet  Other depression - with emotional eating - Plan: buPROPion (WELLBUTRIN SR) 150 MG 12 hr tablet  Vitamin D deficiency - Plan: Vitamin D, Ergocalciferol, (DRISDOL) 50000 units CAPS capsule  At risk for diabetes mellitus  Class 1 obesity with serious comorbidity and body mass index (BMI) of 31.0 to 31.9 in adult, unspecified obesity type - Plan: Liraglutide -Weight Management (SAXENDA) 18 MG/3ML SOPN, Insulin Pen Needle (BD PEN NEEDLE NANO U/F) 32G X 4 MM  MISC  PLAN: Pre-Diabetes Yukiko will continue to work on weight loss, exercise, and decreasing simple carbohydrates in her diet to help decrease the risk of diabetes. We dicussed metformin including benefits and risks. She was informed that eating too many simple carbohydrates or too many calories at one sitting increases the likelihood of GI side effects. Tina Chavez agrees to continue metformin 500 mg daily #30 with no refills. Tina Chavez agreed to follow up with [place] as directed to monitor her progress.  Vitamin D Deficiency Tina Chavez was informed that low vitamin D levels contributes to fatigue and are associated with obesity, breast, and colon cancer. She agrees to continue to take prescription Vit D @50 ,000 IU every week #4 with no refills and will follow up for routine testing of vitamin D, at least 2-3 times per year. She was informed of the risk of over-replacement of vitamin D and agrees to not increase her dose unless she discusses this with [place] first.  Depression with Emotional Eating Behaviors We discussed behavior modification techniques today to help Tina Chavez deal with her emotional eating and depression. She has agreed to continue Wellbutrin SR 150 mg qd #30 with no refills and agreed to follow up as directed.  Diabetes risk counseling Tina Chavez was given extended (15 minutes) diabetes prevention counseling today. She is 57 y.o. female and has risk factors for diabetes including obesity. We discussed intensive lifestyle modifications today with an emphasis on weight loss as well as increasing exercise and decreasing simple carbohydrates in her diet.  Obesity Tina Chavez is currently in the action stage of change. As such, her goal is to continue with weight loss efforts She has agreed to keep a food journal with 1000 calories and 70+ g of  protein  Tina Chavez has been instructed to work up to a goal of 150 minutes of combined cardio and strengthening exercise per week for weight loss and overall health benefits. We  discussed the following Behavioral Modification Stratagies today: increasing lean protein intake and decreasing simple carbohydrates  We discussed various medication options to help Tina Chavez with  her weight loss efforts and we both agreed to start Saxenda 3.0 mg #5 with no refills and nano needles 1 daily. If she has not lost weight by next visit we will recheck her RMR to see if her metabolism has dropped significantly.   Tina Chavez has agreed to follow up with our clinic in 2-3 weeks. She was informed of the importance of frequent follow up visits to maximize her success with intensive lifestyle modifications for her multiple health conditions.   OBESITY BEHAVIORAL INTERVENTION VISIT  Today's visit was # 10   Starting weight: 174 lb Starting date: 03/26/17 Today's weight : 177 lb Today's date: 09/23/17 Total lbs lost to date: 0    ASK: We discussed the diagnosis of obesity with Tina Chavez today and Tina Chavez agreed to give [place] permission to discuss obesity behavioral modification therapy today.  ASSESS: Tina Chavez has the diagnosis of obesity and her BMI today is 31.36 Tina Chavez is in the action stage of change   ADVISE: Tina Chavez was educated on the multiple health risks of obesity as well as the benefit of weight loss to improve her health. She was advised of the need for long term treatment and the importance of lifestyle modifications to improve her current health and to decrease her risk of future health problems.  AGREE: Multiple dietary modification options and treatment options were discussed and  Tina Chavez agreed to follow the recommendations documented in the above note.  ARRANGE: Tina Chavez was educated on the importance of frequent visits to treat obesity as outlined per CMS and USPSTF guidelines and agreed to schedule her next follow up appointment today.  I, Renee Ramus, am acting as transcriptionist for Dennard Nip, MD   I have reviewed the above documentation for accuracy and completeness,  and I agree with the above. -Dennard Nip, MD

## 2017-09-30 ENCOUNTER — Telehealth (INDEPENDENT_AMBULATORY_CARE_PROVIDER_SITE_OTHER): Payer: Self-pay | Admitting: Family Medicine

## 2017-09-30 ENCOUNTER — Other Ambulatory Visit (INDEPENDENT_AMBULATORY_CARE_PROVIDER_SITE_OTHER): Payer: Self-pay | Admitting: Family Medicine

## 2017-09-30 DIAGNOSIS — Z01419 Encounter for gynecological examination (general) (routine) without abnormal findings: Secondary | ICD-10-CM

## 2017-09-30 DIAGNOSIS — Z1239 Encounter for other screening for malignant neoplasm of breast: Secondary | ICD-10-CM

## 2017-09-30 DIAGNOSIS — E669 Obesity, unspecified: Secondary | ICD-10-CM

## 2017-09-30 DIAGNOSIS — F3289 Other specified depressive episodes: Secondary | ICD-10-CM

## 2017-09-30 NOTE — Telephone Encounter (Signed)
Patient is waiting on prior authorization on her [place].  She stated she called Thursday to follow up on this per Dr. Leafy Ro.  Please call patient with an update. Thank you.

## 2017-09-30 NOTE — Telephone Encounter (Signed)
Lm for patient on VM that PA has been started and all clinical questions have been answered and we are just waiting on determination from [place].  We should know something in the next day or so.   Ruford Dudzinski R CMA

## 2017-10-02 ENCOUNTER — Encounter (INDEPENDENT_AMBULATORY_CARE_PROVIDER_SITE_OTHER): Payer: Self-pay

## 2017-10-02 MED FILL — BUPROPION SR 150 MG TABLET: 150 | 30 days supply | Qty: 30 | Fill #0

## 2017-10-02 MED FILL — VIT D2 1.25 MG (50,000 UNIT: 1.25 MG | 28 days supply | Qty: 4 | Fill #0

## 2017-10-02 MED FILL — UNIFINE PENTIPS 6MM 31G: 31G X 6 MM | 90 days supply | Qty: 100 | Fill #0

## 2017-10-02 MED FILL — SAXENDA 18 MG/3 ML PEN: 18 | 30 days supply | Qty: 15 | Fill #0

## 2017-10-08 ENCOUNTER — Other Ambulatory Visit (INDEPENDENT_AMBULATORY_CARE_PROVIDER_SITE_OTHER): Payer: Self-pay | Admitting: Family Medicine

## 2017-10-08 DIAGNOSIS — R7303 Prediabetes: Secondary | ICD-10-CM

## 2017-10-08 MED FILL — metFORMIN HCL 500 MG TABS: 500 | 30 days supply | Qty: 30 | Fill #0

## 2017-10-09 ENCOUNTER — Ambulatory Visit
Admission: RE | Admit: 2017-10-09 | Discharge: 2017-10-09 | Disposition: A | Discharge status: Home / Self Care | Payer: No Typology Code available for payment source | Source: Ambulatory Visit | Attending: Primary Care | Admitting: Primary Care

## 2017-10-09 DIAGNOSIS — Z1239 Encounter for other screening for malignant neoplasm of breast: Secondary | ICD-10-CM

## 2017-10-14 ENCOUNTER — Ambulatory Visit (INDEPENDENT_AMBULATORY_CARE_PROVIDER_SITE_OTHER): Payer: No Typology Code available for payment source | Admitting: Family Medicine

## 2017-10-14 VITALS — BP 92/61 | HR 69 | Temp 98.8°F | Ht 63.0 in | Wt 177.0 lb

## 2017-10-14 DIAGNOSIS — R7303 Prediabetes: Secondary | ICD-10-CM | POA: Diagnosis not present

## 2017-10-14 DIAGNOSIS — Z9189 Other specified personal risk factors, not elsewhere classified: Secondary | ICD-10-CM

## 2017-10-14 DIAGNOSIS — E7849 Other hyperlipidemia: Secondary | ICD-10-CM | POA: Diagnosis not present

## 2017-10-14 DIAGNOSIS — Z6831 Body mass index (BMI) 31.0-31.9, adult: Secondary | ICD-10-CM

## 2017-10-14 DIAGNOSIS — E559 Vitamin D deficiency, unspecified: Secondary | ICD-10-CM | POA: Diagnosis not present

## 2017-10-14 DIAGNOSIS — E669 Obesity, unspecified: Secondary | ICD-10-CM | POA: Diagnosis not present

## 2017-10-14 NOTE — Progress Notes (Signed)
Office: [phone]  /  Fax: [phone]   HPI:   Chief Complaint: OBESITY Tina Chavez is here to discuss her progress with her obesity treatment plan. She is keeping a food journal with 1000 to 1200 calories and 70 grams of protein and is following her eating plan approximately 98 % of the time. She states she is exercising 0 minutes 0 times per week. Tamarra started on Saxenda 0.6mg  at her last visit and has no nausea, but minimal hunger control. She is frustrated that she still has not lost weight even though she states she is meeting her journal goal almost perfectly.  Her weight is 177 lb (80.3 kg) today and has not lost weight since her last visit. She has lost 0 lbs since starting treatment with [place].  Pre-Diabetes Tina Chavez has a diagnosis of pre-diabetes based on her elevated Hgb A1c and was informed this puts her at greater risk of developing diabetes. Her last A1c was 5.7 on 03/26/17. She is due for labs today. She is taking metformin and liraglutide currently and continues to work on diet and exercise to decrease risk of diabetes. She admits polyphagia.  At risk for diabetes Tina Chavez is at higher than average risk for developing diabetes due to her pre-diabetes and obesity.  Hyperlipidemia Keenya has hyperlipidemia and has been trying to improve her cholesterol levels with intensive lifestyle modification including a low saturated fat diet, exercise and weight loss. She is attempting diet control for her cholesterol levels. She denies any chest pain or myalgias. She is due for labs today.  Vitamin D deficiency Jameson has a diagnosis of vitamin D deficiency. She is currently taking prescription vit D. She admits fatigue and denies nausea, vomiting or muscle weakness. She is due for labs today.  ALLERGIES: Allergies  Allergen Reactions  . Sulfa Antibiotics Anaphylaxis  . Pollen Extract     MEDICATIONS: Current Outpatient Medications on File Prior to Visit  Medication Sig Dispense Refill    . aspirin EC 81 MG tablet Take 81 mg by mouth daily.    [name] buPROPion (WELLBUTRIN SR) 150 MG 12 hr tablet Take 1 tablet (150 mg total) by mouth daily. 30 tablet 0  . Calcium Carbonate-Vitamin D3 (CALCIUM 600-D) 600-400 MG-UNIT TABS Takes 2 tablets daily.    Mariane Baumgarten Calcium (STOOL SOFTENER PO) Take by mouth daily as needed.    . Insulin Pen Needle (BD PEN NEEDLE NANO U/F) 32G X 4 MM MISC 1 each by Does not apply route daily. 50 each 0  . Liraglutide -Weight Management (SAXENDA) 18 MG/3ML SOPN Inject 3 mg into the skin daily. 5 pen 0  . loratadine (CLARITIN) 10 MG tablet Take 10 mg by mouth daily.    . metFORMIN (GLUCOPHAGE) 500 MG tablet Take 1 tablet (500 mg total) by mouth daily with breakfast. 30 tablet 0  . Multiple Vitamin (MULTIVITAMIN) tablet Take 1 tablet by mouth daily.    . Omega-3 Fatty Acids (FISH OIL) 1000 MG CAPS 2 capsules daily.    [name] omeprazole (PRILOSEC) 10 MG capsule Take 20 mg by mouth daily.     . polyethylene glycol powder (GLYCOLAX/MIRALAX) powder Take 1 Container by mouth daily.    . Vitamin D, Ergocalciferol, (DRISDOL) 50000 units CAPS capsule Take 1 capsule (50,000 Units total) by mouth every 7 (seven) days. 4 capsule 0   No current facility-administered medications on file prior to visit.     PAST MEDICAL HISTORY: Past Medical History:  Diagnosis Date  . Abnormal Pap smear  of cervix    age 59  . Abnormal uterine bleeding   . Allergy   . Chronic constipation   . Depression   . Endometriosis   . GERD (gastroesophageal reflux disease)   . Hemorrhoids   . Hyperlipidemia   . Obesity   . Osteopenia     PAST SURGICAL HISTORY: Past Surgical History:  Procedure Laterality Date  . ABDOMINOPLASTY  2006   Dr. Lesli Albee  . ABDOMINOPLASTY  2006  . BREAST REDUCTION SURGERY  2006  . [place] OF UTERUS  2016  . FRACTURE SURGERY  1972   Arm   . GYNECOLOGIC CRYOSURGERY     at age 7  . HYSTEROSCOPY  2006  . REDUCTION MAMMAPLASTY      SOCIAL  HISTORY: Social History   Tobacco Use  . Smoking status: Never Smoker  . Smokeless tobacco: Never Used  Substance Use Topics  . Alcohol use: No  . Drug use: No    FAMILY HISTORY: Family History  Problem Relation Age of Onset  . Hyperlipidemia Mother   . Stroke Mother   . Diabetes Mother   . Hypertension Mother   . Kidney disease Mother   . Thyroid disease Mother   . Eating disorder Mother   . Obesity Mother   . Arthritis Father   . Heart disease Father   . Sudden death Father   . Hyperlipidemia Maternal Grandmother   . Heart disease Maternal Grandmother   . Mental illness Maternal Grandmother   . Alcohol abuse Maternal Grandfather   . Arthritis Paternal Grandmother   . Hyperlipidemia Paternal Grandmother   . Heart disease Paternal Grandmother   . Alcohol abuse Paternal Grandfather     ROS: Review of Systems  Constitutional: Positive for malaise/fatigue. Negative for weight loss.  Cardiovascular: Negative for chest pain.  Gastrointestinal: Negative for nausea and vomiting.  Musculoskeletal: Negative for myalgias.       Negative for muscle weakness.  Endo/Heme/Allergies:       Positive for polyphagia.    PHYSICAL EXAM: Blood pressure 92/61, pulse 69, temperature 98.8 F (37.1 C), height 5\' 3"  (1.6 m), weight 177 lb (80.3 kg), last menstrual period 01/01/2014, SpO2 96 %. Body mass index is 31.35 kg/m. Physical Exam  Constitutional: She is oriented to person, place, and time. She appears well-developed and well-nourished.  Cardiovascular: Normal rate.  Pulmonary/Chest: Effort normal.  Musculoskeletal: Normal range of motion.  Neurological: She is oriented to person, place, and time.  Skin: Skin is warm and dry.  Psychiatric: She has a normal mood and affect. Her behavior is normal.  Vitals reviewed.   RECENT LABS AND TESTS: BMET    Component Value Date/Time   NA 140 03/26/2017 1157   K 4.7 03/26/2017 1157   CL 102 03/26/2017 1157   CO2 24 03/26/2017  1157   GLUCOSE 104 (H) 03/26/2017 1157   BUN 20 03/26/2017 1157   CREATININE 0.71 03/26/2017 1157   CALCIUM 9.3 03/26/2017 1157   GFRNONAA 94 03/26/2017 1157   GFRAA 109 03/26/2017 1157   Lab Results  Component Value Date   HGBA1C 5.7 (H) 03/26/2017   Lab Results  Component Value Date   INSULIN 7.1 03/26/2017   CBC    Component Value Date/Time   WBC 7.3 03/26/2017 1157   RBC 4.88 03/26/2017 1157   HGB 14.6 03/26/2017 1157   HCT 44.6 03/26/2017 1157   MCV 91 03/26/2017 1157   MCH 29.9 03/26/2017 1157   MCHC 32.7  03/26/2017 1157   RDW 13.2 03/26/2017 1157   LYMPHSABS 2.7 03/26/2017 1157   EOSABS 0.2 03/26/2017 1157   BASOSABS 0.0 03/26/2017 1157   Iron/TIBC/Ferritin/ %Sat No results found for: IRON, TIBC, FERRITIN, IRONPCTSAT Lipid Panel     Component Value Date/Time   CHOL 209 (H) 03/26/2017 1157   TRIG 83 03/26/2017 1157   HDL 74 03/26/2017 1157   LDLCALC 118 (H) 03/26/2017 1157   Hepatic Function Panel     Component Value Date/Time   PROT 6.9 03/26/2017 1157   ALBUMIN 4.7 03/26/2017 1157   AST 16 03/26/2017 1157   ALT 19 03/26/2017 1157   ALKPHOS 92 03/26/2017 1157   BILITOT 0.4 03/26/2017 1157      Component Value Date/Time   TSH 2.920 03/26/2017 1157   Results for Snapp, Garry (MRN [id number]) as of 10/14/2017 17:11  Ref. Range 03/26/2017 11:57  Vitamin D, 25-Hydroxy Latest Ref Range: 30.0 - 100.0 ng/mL 31.7   ASSESSMENT AND PLAN: Prediabetes - Plan: Comprehensive metabolic panel, Hemoglobin A1c, Insulin, random  Other hyperlipidemia - Plan: Lipid panel  Vitamin D deficiency - Plan: VITAMIN D 25 Hydroxy (Vit-D Deficiency, Fractures)  At risk for diabetes mellitus  Class 1 obesity with serious comorbidity and body mass index (BMI) of 31.0 to 31.9 in adult, unspecified obesity type  PLAN:  Pre-Diabetes Kitty will continue to work on weight loss, exercise, and decreasing simple carbohydrates in her diet to help decrease the risk of diabetes. She  was informed that eating too many simple carbohydrates or too many calories at one sitting increases the likelihood of GI side effects. Tina Chavez agrees to continue her diet and metformin and a prescription was not written today. We will check labs today. Tina Chavez agreed to follow up with [place] as directed to monitor her progress in 2 to 3 weeks.  Diabetes risk counseling Tina Chavez was given extended (15 minutes) diabetes prevention counseling today. She is 57 y.o. female and has risk factors for diabetes including pre-diabetes and obesity. We discussed intensive lifestyle modifications today with an emphasis on weight loss as well as increasing exercise and decreasing simple carbohydrates in her diet.  Hyperlipidemia Tina Chavez was informed of the American Heart Association Guidelines emphasizing intensive lifestyle modifications as the first line treatment for hyperlipidemia. We discussed many lifestyle modifications today in depth, and Tina Chavez will continue to work on decreasing saturated fats such as fatty red meat, butter and many fried foods. She will also increase vegetables and lean protein in her diet and continue to work on exercise and weight loss efforts. We will check labs today. Tina Chavez agrees to continue her diet and will return at the agreed upon time.  Vitamin D Deficiency Tina Chavez was informed that low vitamin D levels contributes to fatigue and are associated with obesity, breast, and colon cancer. She agrees to continue to take prescription Vit D @50 ,000 IU every week and will follow up for routine testing of vitamin D, at least 2-3 times per year. She was informed of the risk of over-replacement of vitamin D and agrees to not increase her dose unless she discusses this with [place] first. We will check labs today and she will follow up as directed.  Obesity Tina Chavez is currently in the action stage of change. As such, her goal is to continue with weight loss efforts. She has agreed to keep a food journal with 1000  to 1200 calories and 70 grams of protein. Tina Chavez was encouraged to continue journaling and add strengthening exercises. We  discussed the following Behavioral Modification Strategies today: increasing lean protein intake, decreasing simple carbohydrates, and keep a strict food journal. We discussed various medication options to help Tina Chavez with her weight loss efforts and we both agreed to increase Saxenda to 1.2mg  and will review her journal closely at her next visit.  Tina Chavez has agreed to follow up with our clinic in 2 to 3 weeks. She was informed of the importance of frequent follow up visits to maximize her success with intensive lifestyle modifications for her multiple health conditions.   OBESITY BEHAVIORAL INTERVENTION VISIT  Today's visit was # 11   Starting weight: 174 lbs Starting date: 03/27/27 Today's weight : Weight: 177 lb (80.3 kg)  Today's date: 10/14/2017 Total lbs lost to date: 0  ASK: We discussed the diagnosis of obesity with Tina Chavez today and Amalya agreed to give [place] permission to discuss obesity behavioral modification therapy today.  ASSESS: Tina Chavez has the diagnosis of obesity and her BMI today is 31.36. Tina Chavez is in the action stage of change.   ADVISE: Tina Chavez was educated on the multiple health risks of obesity as well as the benefit of weight loss to improve her health. She was advised of the need for long term treatment and the importance of lifestyle modifications to improve her current health and to decrease her risk of future health problems.  AGREE: Multiple dietary modification options and treatment options were discussed and Tina Chavez agreed to follow the recommendations documented in the above note.  ARRANGE: Tina Chavez was educated on the importance of frequent visits to treat obesity as outlined per CMS and USPSTF guidelines and agreed to schedule her next follow up appointment today.  I, Marcille Blanco, am acting as transcriptionist for Starlyn Skeans, MD  I  have reviewed the above documentation for accuracy and completeness, and I agree with the above. -Dennard Nip, MD

## 2017-10-15 LAB — LIPID PANEL
CHOLESTEROL TOTAL: 195 mg/dL (ref 100–199)
Chol/HDL Ratio: 3.1 ratio (ref 0.0–4.4)
HDL: 63 mg/dL (ref >39)
LDL Calculated: 112 mg/dL — ABNORMAL HIGH (ref 0–99)
TRIGLYCERIDES: 98 mg/dL (ref 0–149)
VLDL Cholesterol Cal: 20 mg/dL (ref 5–40)

## 2017-10-15 LAB — COMPREHENSIVE METABOLIC PANEL
ALK PHOS: 77 IU/L (ref 39–117)
ALT: 17 IU/L (ref 0–32)
AST: 16 IU/L (ref 0–40)
Albumin/Globulin Ratio: 2.5 — ABNORMAL HIGH (ref 1.2–2.2)
Albumin: 4.5 g/dL (ref 3.5–5.5)
BUN/Creatinine Ratio: 26 — ABNORMAL HIGH (ref 9–23)
BUN: 22 mg/dL (ref 6–24)
Bilirubin Total: 0.3 mg/dL (ref 0.0–1.2)
CHLORIDE: 106 mmol/L (ref 96–106)
CO2: 24 mmol/L (ref 20–29)
CREATININE: 0.84 mg/dL (ref 0.57–1.00)
Calcium: 9.4 mg/dL (ref 8.7–10.2)
GFR calc Af Amer: 89 mL/min/{1.73_m2} (ref >59)
GFR calc non Af Amer: 77 mL/min/{1.73_m2} (ref >59)
Globulin, Total: 1.8 g/dL (ref 1.5–4.5)
Glucose: 90 mg/dL (ref 65–99)
Potassium: 4.2 mmol/L (ref 3.5–5.2)
Sodium: 145 mmol/L — ABNORMAL HIGH (ref 134–144)
Total Protein: 6.3 g/dL (ref 6.0–8.5)

## 2017-10-15 LAB — INSULIN, RANDOM: INSULIN: 6.8 u[IU]/mL (ref 2.6–24.9)

## 2017-10-15 LAB — HEMOGLOBIN A1C
Est. average glucose Bld gHb Est-mCnc: 111 mg/dL
HEMOGLOBIN A1C: 5.5 % (ref 4.8–5.6)

## 2017-10-15 LAB — VITAMIN D 25 HYDROXY (VIT D DEFICIENCY, FRACTURES): Vit D, 25-Hydroxy: 51.9 ng/mL (ref 30.0–100.0)

## 2017-10-24 ENCOUNTER — Ambulatory Visit (INDEPENDENT_AMBULATORY_CARE_PROVIDER_SITE_OTHER): Payer: No Typology Code available for payment source | Admitting: Obstetrics & Gynecology

## 2017-10-24 ENCOUNTER — Other Ambulatory Visit (HOSPITAL_COMMUNITY)
Admission: RE | Admit: 2017-10-24 | Discharge: 2017-10-24 | Disposition: A | Discharge status: Home / Self Care | Payer: No Typology Code available for payment source | Source: Ambulatory Visit | Attending: Obstetrics & Gynecology | Admitting: Obstetrics & Gynecology

## 2017-10-24 ENCOUNTER — Other Ambulatory Visit: Payer: Self-pay

## 2017-10-24 ENCOUNTER — Encounter: Payer: Self-pay | Admitting: Obstetrics & Gynecology

## 2017-10-24 VITALS — BP 120/64 | HR 96 | Resp 16 | Ht 62.75 in | Wt 178.4 lb

## 2017-10-24 DIAGNOSIS — Z1211 Encounter for screening for malignant neoplasm of colon: Secondary | ICD-10-CM

## 2017-10-24 DIAGNOSIS — Z124 Encounter for screening for malignant neoplasm of cervix: Secondary | ICD-10-CM | POA: Insufficient documentation

## 2017-10-24 DIAGNOSIS — Z01419 Encounter for gynecological examination (general) (routine) without abnormal findings: Secondary | ICD-10-CM

## 2017-10-24 MED ORDER — ESTROGENS, CONJUGATED 0.625 MG/GM VA CREA: TOPICAL_CREAM | VAGINAL | 2 refills | Status: DC

## 2017-10-24 MED FILL — PREMARIN VAGINAL CREAM-APPL: 0.625 | 90 days supply | Qty: 30 | Fill #0

## 2017-10-24 NOTE — Progress Notes (Signed)
57 y.o. G60P0020 Married White or Caucasian female here for annual exam.  Continues to work on weight loss.  Down 60 pounds.  Started Saxenda a couple weeks ago.  She is on 1.2mg .  She is frustrated that she hasn't really lost any more weight this year.    Denies vaginal bleeding.  Having a lot of pain with intercourse.  Has been using an OTC lubricant.    Patient's last menstrual period was 01/01/2014 (approximate).          Sexually active: Yes.    The current method of family planning is post menopausal status.    Exercising: No.   Smoker:  no  Health Maintenance: Pap:  08/06/14 Neg. HR HPV:neg - Care Everywhere History of abnormal Pap:  Yes, age 41 MMG:  10/09/17 BIRADS1:Neg  Colonoscopy:  2009 f/u 10 years  BMD:   11/18/15 very mild osteopenia, repeat 5 years TDaP:  ~ 2 years ago  Pneumonia vaccine(s):  n/a Shingrix:   D/w pt today.  Declines. Hep C testing: 10/19/16 Neg  Screening Labs: PCP   reports that she has never smoked. She has never used smokeless tobacco. She reports that she does not drink alcohol or use drugs.  Past Medical History:  Diagnosis Date  . Abnormal Pap smear of cervix    age 40  . Abnormal uterine bleeding   . Allergy   . Chronic constipation   . Depression   . Endometriosis   . GERD (gastroesophageal reflux disease)   . Hemorrhoids   . Hyperlipidemia   . Obesity   . Osteopenia   . Pre-diabetes     Past Surgical History:  Procedure Laterality Date  . ABDOMINOPLASTY  2006   Dr. Lesli Albee  . ABDOMINOPLASTY  2006  . BREAST REDUCTION SURGERY  2006  . [place] OF UTERUS  2016  . FRACTURE SURGERY  1972   Arm   . GYNECOLOGIC CRYOSURGERY     at age 75  . HYSTEROSCOPY  2006  . REDUCTION MAMMAPLASTY      Current Outpatient Medications  Medication Sig Dispense Refill  . aspirin EC 81 MG tablet Take 81 mg by mouth daily.    [name] buPROPion (WELLBUTRIN SR) 150 MG 12 hr tablet Take 1 tablet (150 mg total) by mouth daily. 30 tablet 0  .  Calcium Carbonate-Vitamin D3 (CALCIUM 600-D) 600-400 MG-UNIT TABS Takes 2 tablets daily.    . diazepam (VALIUM) 10 MG tablet once. With dental appt  0  . Docusate Calcium (STOOL SOFTENER PO) Take by mouth daily as needed.    . Insulin Pen Needle (BD PEN NEEDLE NANO U/F) 32G X 4 MM MISC 1 each by Does not apply route daily. 50 each 0  . Liraglutide -Weight Management (SAXENDA) 18 MG/3ML SOPN Inject 3 mg into the skin daily. 5 pen 0  . loratadine (CLARITIN) 10 MG tablet Take 10 mg by mouth daily.    . metFORMIN (GLUCOPHAGE) 500 MG tablet Take 1 tablet (500 mg total) by mouth daily with breakfast. 30 tablet 0  . Multiple Vitamin (MULTIVITAMIN) tablet Take 1 tablet by mouth daily.    . Omega-3 Fatty Acids (FISH OIL) 1000 MG CAPS 2 capsules daily.    [name] omeprazole (PRILOSEC) 10 MG capsule Take 20 mg by mouth daily.     . polyethylene glycol powder (GLYCOLAX/MIRALAX) powder Take 1 Container by mouth daily.    . Vitamin D, Ergocalciferol, (DRISDOL) 50000 units CAPS capsule Take 1 capsule (50,000 Units  total) by mouth every 7 (seven) days. 4 capsule 0   No current facility-administered medications for this visit.     Family History  Problem Relation Age of Onset  . Hyperlipidemia Mother   . Stroke Mother   . Diabetes Mother   . Hypertension Mother   . Kidney disease Mother   . Thyroid disease Mother   . Eating disorder Mother   . Obesity Mother   . Arthritis Father   . Heart disease Father   . Sudden death Father   . Hyperlipidemia Maternal Grandmother   . Heart disease Maternal Grandmother   . Mental illness Maternal Grandmother   . Alcohol abuse Maternal Grandfather   . Arthritis Paternal Grandmother   . Hyperlipidemia Paternal Grandmother   . Heart disease Paternal Grandmother   . Alcohol abuse Paternal Grandfather     Review of Systems  All other systems reviewed and are negative.   Exam:   BP 120/64 (BP Location: Right Arm, Patient Position: Sitting, Cuff Size: Large)   Pulse  96   Resp 16   Ht 5' 2.75" (1.594 m)   Wt 178 lb 6.4 oz (80.9 kg)   LMP 01/01/2014 (Approximate)   BMI 31.85 kg/m   Height: 5' 2.75" (159.4 cm)  Ht Readings from Last 3 Encounters:  10/24/17 5' 2.75" (1.594 m)  10/14/17 5\' 3"  (1.6 m)  09/23/17 5\' 3"  (1.6 m)    General appearance: alert, cooperative and appears stated age Head: Normocephalic, without obvious abnormality, atraumatic Neck: no adenopathy, supple, symmetrical, trachea midline and thyroid normal to inspection and palpation Lungs: clear to auscultation bilaterally Breasts: normal appearance, no masses or tenderness Heart: regular rate and rhythm Abdomen: soft, non-tender; bowel sounds normal; no masses,  no organomegaly Extremities: extremities normal, atraumatic, no cyanosis or edema Skin: Skin color, texture, turgor normal. No rashes or lesions Lymph nodes: Cervical, supraclavicular, and axillary nodes normal. No abnormal inguinal nodes palpated Neurologic: Grossly normal   Pelvic: External genitalia:  no lesions              Urethra:  normal appearing urethra with no masses, tenderness or lesions              Bartholins and Skenes: normal                 Vagina: normal appearing vagina with normal color and discharge, no lesions              Cervix: no lesions              Pap taken: Yes.   Bimanual Exam:  Uterus:  normal size, contour, position, consistency, mobility, non-tender              Adnexa: normal adnexa and no mass, fullness, tenderness               Rectovaginal: Confirms               Anus:  normal sphincter tone, no lesions  Chaperone was present for exam.  A:  Well Woman with normal exam PMP, no HRT Atrophic vaginal changes and dyspareunia GERD H/O GI ulcer Working on weight loss H/O shingles multiple times on face  P:   Mammogram guidelines reviewed.  Grade A breast density. pap smear with HR HPV obtained today Trial of Premarin vaginal cream, 1/2 gram pv twice weekly.  #30gm/2RF.   Recheck 3 months. Blood work is UTD and done 10/14/17  Referral to Dr. Carlean Purl for  screening colonoscopy return annually or prn

## 2017-10-28 ENCOUNTER — Ambulatory Visit (INDEPENDENT_AMBULATORY_CARE_PROVIDER_SITE_OTHER): Payer: No Typology Code available for payment source | Admitting: Physician Assistant

## 2017-10-28 VITALS — BP 115/79 | HR 83 | Temp 98.4°F | Ht 63.0 in | Wt 175.0 lb

## 2017-10-28 DIAGNOSIS — R7303 Prediabetes: Secondary | ICD-10-CM

## 2017-10-28 DIAGNOSIS — E669 Obesity, unspecified: Secondary | ICD-10-CM

## 2017-10-28 DIAGNOSIS — Z9189 Other specified personal risk factors, not elsewhere classified: Secondary | ICD-10-CM

## 2017-10-28 DIAGNOSIS — F3289 Other specified depressive episodes: Secondary | ICD-10-CM | POA: Diagnosis not present

## 2017-10-28 DIAGNOSIS — Z6831 Body mass index (BMI) 31.0-31.9, adult: Secondary | ICD-10-CM

## 2017-10-28 DIAGNOSIS — E559 Vitamin D deficiency, unspecified: Secondary | ICD-10-CM

## 2017-10-28 LAB — CYTOLOGY - PAP
Diagnosis: NEGATIVE
HPV: NOT DETECTED

## 2017-10-28 MED ORDER — VITAMIN D (ERGOCALCIFEROL) 1.25 MG (50000 UNIT) PO CAPS: 50000.0000 [IU] | ORAL_CAPSULE | ORAL | 0 refills | Status: DC

## 2017-10-28 MED ORDER — LIRAGLUTIDE -WEIGHT MANAGEMENT 18 MG/3ML ~~LOC~~ SOPN: 3.0000 mg | PEN_INJECTOR | Freq: Every day | SUBCUTANEOUS | 0 refills | Status: DC

## 2017-10-28 MED ORDER — METFORMIN HCL 500 MG PO TABS: 500.0000 mg | ORAL_TABLET | Freq: Every day | ORAL | 0 refills | Status: DC

## 2017-10-28 MED ORDER — BUPROPION HCL ER (SR) 150 MG PO TB12: 150.0000 mg | ORAL_TABLET | Freq: Every day | ORAL | 0 refills | Status: DC

## 2017-10-28 MED FILL — BUPROPION SR 150 MG TABLET: 150 | 30 days supply | Qty: 30 | Fill #0

## 2017-10-28 MED FILL — VIT D2 1.25 MG (50,000 UNIT: 1.25 MG | 28 days supply | Qty: 2 | Fill #0

## 2017-10-28 MED FILL — SAXENDA 18 MG/3 ML PEN: 18 | 30 days supply | Qty: 15 | Fill #0

## 2017-10-29 NOTE — Progress Notes (Signed)
Office: [phone]  /  Fax: [phone]   HPI:   Chief Complaint: OBESITY Tina Chavez is here to discuss her progress with her obesity treatment plan. She is keeping a food journal with 1000 to 1200 calories and 70 grams of protein and is following her eating plan approximately 90 % of the time. She states she is exercising 0 minutes 0 times per week. Tina Chavez did well with weight loss. She reports that she indulged this weekend due to having a birthday celebration. She is frustrated with overall lack of weight loss.  Her weight is 175 lb (79.4 kg) today and has had a weight loss of 2 pounds over a period of 2 weeks since her last visit. She has lost 0 lbs since starting treatment with [place].  Pre-Diabetes Tina Chavez has a diagnosis of pre-diabetes based on her elevated Hgb A1c and was informed this puts her at greater risk of developing diabetes. She is taking metformin currently and continues to work on diet and exercise to decrease risk of diabetes. Her last A1c was much improved now at 5.5 on 10/14/17. She denies nausea, vomiting, or diarrhea.  At risk for diabetes Tina Chavez is at higher than average risk for developing diabetes due to her pre-diabetes and obesity.   Vitamin D deficiency Tina Chavez has a diagnosis of vitamin D deficiency. She is currently taking vit D and denies nausea, vomiting or muscle weakness.  Depression with emotional eating behaviors Tina Chavez is struggling with emotional eating and using food for comfort to the extent that it is negatively impacting her health. Her depression is controlled with bupropion and she states that bupropion helps with cravings.  ALLERGIES: Allergies  Allergen Reactions  . Sulfa Antibiotics Anaphylaxis  . Pollen Extract     MEDICATIONS: Current Outpatient Medications on File Prior to Visit  Medication Sig Dispense Refill  . Calcium Carbonate-Vitamin D3 (CALCIUM 600-D) 600-400 MG-UNIT TABS Takes 2 tablets daily.    [name] conjugated estrogens (PREMARIN)  vaginal cream 1/2 gram vaginally twice weekly 30 g 2  . diazepam (VALIUM) 10 MG tablet once. With dental appt  0  . Docusate Calcium (STOOL SOFTENER PO) Take by mouth daily as needed.    . Insulin Pen Needle (BD PEN NEEDLE NANO U/F) 32G X 4 MM MISC 1 each by Does not apply route daily. 50 each 0  . loratadine (CLARITIN) 10 MG tablet Take 10 mg by mouth daily.    . Multiple Vitamin (MULTIVITAMIN) tablet Take 1 tablet by mouth daily.    . Omega-3 Fatty Acids (FISH OIL) 1000 MG CAPS 2 capsules daily.    [name] omeprazole (PRILOSEC) 10 MG capsule Take 20 mg by mouth daily.     . polyethylene glycol powder (GLYCOLAX/MIRALAX) powder Take 1 Container by mouth daily.     No current facility-administered medications on file prior to visit.     PAST MEDICAL HISTORY: Past Medical History:  Diagnosis Date  . Abnormal Pap smear of cervix    age 1  . Abnormal uterine bleeding   . Allergy   . Chronic constipation   . Depression   . Endometriosis   . GERD (gastroesophageal reflux disease)   . Hemorrhoids   . Hyperlipidemia   . Obesity   . Osteopenia   . Pre-diabetes     PAST SURGICAL HISTORY: Past Surgical History:  Procedure Laterality Date  . ABDOMINOPLASTY  2006   Dr. Lesli Albee  . BREAST REDUCTION SURGERY  2006  . [place] OF UTERUS  2016  .  FRACTURE SURGERY  1972   Arm   . GYNECOLOGIC CRYOSURGERY     at age 74  . HYSTEROSCOPY  2006  . REDUCTION MAMMAPLASTY      SOCIAL HISTORY: Social History   Tobacco Use  . Smoking status: Never Smoker  . Smokeless tobacco: Never Used  Substance Use Topics  . Alcohol use: No  . Drug use: No    FAMILY HISTORY: Family History  Problem Relation Age of Onset  . Hyperlipidemia Mother   . Stroke Mother   . Diabetes Mother   . Hypertension Mother   . Kidney disease Mother   . Thyroid disease Mother   . Eating disorder Mother   . Obesity Mother   . Arthritis Father   . Heart disease Father   . Sudden death Father   .  Hyperlipidemia Maternal Grandmother   . Heart disease Maternal Grandmother   . Mental illness Maternal Grandmother   . Alcohol abuse Maternal Grandfather   . Arthritis Paternal Grandmother   . Hyperlipidemia Paternal Grandmother   . Heart disease Paternal Grandmother   . Alcohol abuse Paternal Grandfather     ROS: Review of Systems  Constitutional: Positive for weight loss.  Gastrointestinal: Negative for diarrhea, nausea and vomiting.  Musculoskeletal:       Negative for muscle weakness.  Psychiatric/Behavioral: Positive for depression.    PHYSICAL EXAM: Blood pressure 115/79, pulse 83, temperature 98.4 F (36.9 C), temperature source Oral, height 5\' 3"  (1.6 m), weight 175 lb (79.4 kg), last menstrual period 01/01/2014, SpO2 96 %. Body mass index is 31 kg/m. Physical Exam  Constitutional: She is oriented to person, place, and time. She appears well-developed and well-nourished.  Cardiovascular: Normal rate.  Pulmonary/Chest: Effort normal.  Musculoskeletal: Normal range of motion.  Neurological: She is oriented to person, place, and time.  Skin: Skin is warm and dry.  Psychiatric: She has a normal mood and affect. Her behavior is normal.  Vitals reviewed.   RECENT LABS AND TESTS: BMET    Component Value Date/Time   NA 145 (H) 10/14/2017 1029   K 4.2 10/14/2017 1029   CL 106 10/14/2017 1029   CO2 24 10/14/2017 1029   GLUCOSE 90 10/14/2017 1029   BUN 22 10/14/2017 1029   CREATININE 0.84 10/14/2017 1029   CALCIUM 9.4 10/14/2017 1029   GFRNONAA 77 10/14/2017 1029   GFRAA 89 10/14/2017 1029   Lab Results  Component Value Date   HGBA1C 5.5 10/14/2017   HGBA1C 5.7 (H) 03/26/2017   Lab Results  Component Value Date   INSULIN 6.8 10/14/2017   INSULIN 7.1 03/26/2017   CBC    Component Value Date/Time   WBC 7.3 03/26/2017 1157   RBC 4.88 03/26/2017 1157   HGB 14.6 03/26/2017 1157   HCT 44.6 03/26/2017 1157   MCV 91 03/26/2017 1157   MCH 29.9 03/26/2017 1157     MCHC 32.7 03/26/2017 1157   RDW 13.2 03/26/2017 1157   LYMPHSABS 2.7 03/26/2017 1157   EOSABS 0.2 03/26/2017 1157   BASOSABS 0.0 03/26/2017 1157   Iron/TIBC/Ferritin/ %Sat No results found for: IRON, TIBC, FERRITIN, IRONPCTSAT Lipid Panel     Component Value Date/Time   CHOL 195 10/14/2017 1029   TRIG 98 10/14/2017 1029   HDL 63 10/14/2017 1029   CHOLHDL 3.1 10/14/2017 1029   LDLCALC 112 (H) 10/14/2017 1029   Hepatic Function Panel     Component Value Date/Time   PROT 6.3 10/14/2017 1029   ALBUMIN 4.5 10/14/2017  1029   AST 16 10/14/2017 1029   ALT 17 10/14/2017 1029   ALKPHOS 77 10/14/2017 1029   BILITOT 0.3 10/14/2017 1029      Component Value Date/Time   TSH 2.920 03/26/2017 1157   Results for MARNESHA, GAGEN (MRN [id number]) as of 10/29/2017 09:29  Ref. Range 10/14/2017 10:29  Vitamin D, 25-Hydroxy Latest Ref Range: 30.0 - 100.0 ng/mL 51.9   ASSESSMENT AND PLAN: Prediabetes - Plan: metFORMIN (GLUCOPHAGE) 500 MG tablet  Vitamin D deficiency - Plan: Vitamin D, Ergocalciferol, (DRISDOL) 50000 units CAPS capsule  Other depression - with emotional eating - Plan: buPROPion (WELLBUTRIN SR) 150 MG 12 hr tablet  At risk for diabetes mellitus  Class 1 obesity with serious comorbidity and body mass index (BMI) of 31.0 to 31.9 in adult, unspecified obesity type - Plan: Liraglutide -Weight Management (SAXENDA) 18 MG/3ML SOPN  PLAN:  Pre-Diabetes Tina Chavez will continue to work on weight loss, exercise, and decreasing simple carbohydrates in her diet to help decrease the risk of diabetes. She was informed that eating too many simple carbohydrates or too many calories at one sitting increases the likelihood of GI side effects. Tina Chavez agreed to continue metformin 500mg  qd #30 with no refills and a prescription was written today. Tina Chavez agreed to follow up with [place] as directed to monitor her progress in 2 to 3 weeks.  Diabetes risk counseling Tina Chavez was given extended (15 minutes)  diabetes prevention counseling today. She is 57 y.o. female and has risk factors for diabetes including pre-diabetes and obesity. We discussed intensive lifestyle modifications today with an emphasis on weight loss as well as increasing exercise and decreasing simple carbohydrates in her diet.  Vitamin D Deficiency Tina Chavez was informed that low vitamin D levels contributes to fatigue and are associated with obesity, breast, and colon cancer. She agrees to continue to take prescription Vit D @50 ,000 IU, 1 capsule every 14 days #2 with no refills and will follow up for routine testing of vitamin D, at least 2-3 times per year. She was informed of the risk of over-replacement of vitamin D and agrees to not increase her dose unless she discusses this with [place] first. Tina Chavez agrees to follow up as directed.  Depression with Emotional Eating Behaviors We discussed behavior modification techniques today to help Tina Chavez deal with her emotional eating and depression. She has agreed to continue to take bupropion SR 150 mg qd #30 with no refills and agreed to follow up as directed in 2 to 3 weeks.  Obesity Tina Chavez is currently in the action stage of change. As such, her goal is to continue with weight loss efforts. She has agreed to keep a food journal with 1000 to 1200 calories and 70 grams of protein daily. Tina Chavez has been instructed to work up to a goal of 150 minutes of combined cardio and strengthening exercise per week for weight loss and overall health benefits. We discussed the following Behavioral Modification Strategies today: work on meal planning and easy cooking plans and planning for success. We discussed various medication options to help Tina Chavez with her weight loss efforts and we both agreed to continue Saxenda 3mg  qd # 5 with no refills.  Tina Chavez has agreed to follow up with our clinic in 2 to 3 weeks. She was informed of the importance of frequent follow up visits to maximize her success with intensive  lifestyle modifications for her multiple health conditions.   OBESITY BEHAVIORAL INTERVENTION VISIT  Today's visit was # 12   Starting  weight: 174 lbs Starting date: 03/26/17 Today's weight : Weight: 175 lb (79.4 kg)  Today's date: 10/28/2017 Total lbs lost to date: 0  ASK: We discussed the diagnosis of obesity with Tina Chavez today and Tina Chavez agreed to give [place] permission to discuss obesity behavioral modification therapy today.  ASSESS: Tina Chavez has the diagnosis of obesity and her BMI today is 31.01. Tina Chavez is in the action stage of change.   ADVISE: Tina Chavez was educated on the multiple health risks of obesity as well as the benefit of weight loss to improve her health. She was advised of the need for long term treatment and the importance of lifestyle modifications to improve her current health and to decrease her risk of future health problems.  AGREE: Multiple dietary modification options and treatment options were discussed and Tina Chavez agreed to follow the recommendations documented in the above note.  ARRANGE: Tina Chavez was educated on the importance of frequent visits to treat obesity as outlined per CMS and USPSTF guidelines and agreed to schedule her next follow up appointment today.  Lenward Chancellor, am acting as transcriptionist for Abby Potash, PA-C I, Abby Potash, PA-C have reviewed above note and agree with its content

## 2017-11-11 MED FILL — metFORMIN HCL 500 MG TABS: 500 | 30 days supply | Qty: 30 | Fill #0

## 2017-11-12 ENCOUNTER — Ambulatory Visit (INDEPENDENT_AMBULATORY_CARE_PROVIDER_SITE_OTHER): Payer: No Typology Code available for payment source | Admitting: Physician Assistant

## 2017-11-12 VITALS — BP 105/75 | HR 83 | Temp 98.4°F | Ht 63.0 in | Wt 176.0 lb

## 2017-11-12 DIAGNOSIS — E669 Obesity, unspecified: Secondary | ICD-10-CM

## 2017-11-12 DIAGNOSIS — Z6831 Body mass index (BMI) 31.0-31.9, adult: Secondary | ICD-10-CM

## 2017-11-12 DIAGNOSIS — R7303 Prediabetes: Secondary | ICD-10-CM

## 2017-11-12 DIAGNOSIS — E559 Vitamin D deficiency, unspecified: Secondary | ICD-10-CM

## 2017-11-12 DIAGNOSIS — Z9189 Other specified personal risk factors, not elsewhere classified: Secondary | ICD-10-CM

## 2017-11-12 MED ORDER — METFORMIN HCL 500 MG PO TABS: 500.0000 mg | ORAL_TABLET | Freq: Every day | ORAL | 0 refills | Status: DC

## 2017-11-12 NOTE — Progress Notes (Signed)
Office: [phone]  /  Fax: [phone]   HPI:   Chief Complaint: OBESITY Tina Chavez is here to discuss her progress with her obesity treatment plan. She is on the keep a food journal with 1000-1200 calories and 70 grams of protein daily and is following her eating plan approximately 95 % of the time. She states she is exercising 0 minutes 0 times per week. Tina Chavez is frustrated today, as she has been following the plan closely and she is not losing weight. She reports sitting most of the day and has not started using resistance bands yet.  Her weight is 176 lb (79.8 kg) today and has gained 1 pound since her last visit. She has lost 0 lbs since starting treatment with [place].  Pre-Diabetes [name] has a diagnosis of pre-diabetes based on her elevated Hgb A1c and was informed this puts her at greater risk of developing diabetes. She denies nausea, vomiting, or diarrhea on metformin and continues to work on diet and exercise to decrease risk of diabetes. She denies polyphagia or hypoglycemia.  At risk for diabetes Tina Chavez is at higher than average risk for developing diabetes due to her obesity and pre-diabetes. She currently denies polyuria or polydipsia.  Vitamin D Deficiency Tina Chavez has a diagnosis of vitamin D deficiency. She is currently taking prescription Vit D and denies nausea, vomiting or muscle weakness.  ALLERGIES: Allergies  Allergen Reactions  . Sulfa Antibiotics Anaphylaxis  . Pollen Extract     MEDICATIONS: Current Outpatient Medications on File Prior to Visit  Medication Sig Dispense Refill  . buPROPion (WELLBUTRIN SR) 150 MG 12 hr tablet Take 1 tablet (150 mg total) by mouth daily. 30 tablet 0  . Calcium Carbonate-Vitamin D3 (CALCIUM 600-D) 600-400 MG-UNIT TABS Takes 2 tablets daily.    [name] conjugated estrogens (PREMARIN) vaginal cream 1/2 gram vaginally twice weekly 30 g 2  . diazepam (VALIUM) 10 MG tablet once. With dental appt  0  . Docusate Calcium (STOOL SOFTENER PO) Take  by mouth daily as needed.    . Insulin Pen Needle (BD PEN NEEDLE NANO U/F) 32G X 4 MM MISC 1 each by Does not apply route daily. 50 each 0  . Liraglutide -Weight Management (SAXENDA) 18 MG/3ML SOPN Inject 3 mg into the skin daily. 5 pen 0  . loratadine (CLARITIN) 10 MG tablet Take 10 mg by mouth daily.    . Multiple Vitamin (MULTIVITAMIN) tablet Take 1 tablet by mouth daily.    . Omega-3 Fatty Acids (FISH OIL) 1000 MG CAPS 2 capsules daily.    [name] omeprazole (PRILOSEC) 10 MG capsule Take 20 mg by mouth daily.     . polyethylene glycol powder (GLYCOLAX/MIRALAX) powder Take 1 Container by mouth daily.    . Vitamin D, Ergocalciferol, (DRISDOL) 50000 units CAPS capsule Take 1 capsule (50,000 Units total) by mouth every 14 (fourteen) days. 2 capsule 0   No current facility-administered medications on file prior to visit.     PAST MEDICAL HISTORY: Past Medical History:  Diagnosis Date  . Abnormal Pap smear of cervix    age 39  . Abnormal uterine bleeding   . Allergy   . Chronic constipation   . Depression   . Endometriosis   . GERD (gastroesophageal reflux disease)   . Hemorrhoids   . Hyperlipidemia   . Obesity   . Osteopenia   . Pre-diabetes     PAST SURGICAL HISTORY: Past Surgical History:  Procedure Laterality Date  . ABDOMINOPLASTY  2006  Dr. Lesli Albee  . BREAST REDUCTION SURGERY  2006  . [place] OF UTERUS  2016  . FRACTURE SURGERY  1972   Arm   . GYNECOLOGIC CRYOSURGERY     at age 29  . HYSTEROSCOPY  2006  . REDUCTION MAMMAPLASTY      SOCIAL HISTORY: Social History   Tobacco Use  . Smoking status: Never Smoker  . Smokeless tobacco: Never Used  Substance Use Topics  . Alcohol use: No  . Drug use: No    FAMILY HISTORY: Family History  Problem Relation Age of Onset  . Hyperlipidemia Mother   . Stroke Mother   . Diabetes Mother   . Hypertension Mother   . Kidney disease Mother   . Thyroid disease Mother   . Eating disorder Mother   . Obesity  Mother   . Arthritis Father   . Heart disease Father   . Sudden death Father   . Hyperlipidemia Maternal Grandmother   . Heart disease Maternal Grandmother   . Mental illness Maternal Grandmother   . Alcohol abuse Maternal Grandfather   . Arthritis Paternal Grandmother   . Hyperlipidemia Paternal Grandmother   . Heart disease Paternal Grandmother   . Alcohol abuse Paternal Grandfather     ROS: Review of Systems  Constitutional: Negative for weight loss.  Gastrointestinal: Negative for diarrhea, nausea and vomiting.  Genitourinary: Negative for frequency.  Musculoskeletal:       Negative muscle weakness  Endo/Heme/Allergies: Negative for polydipsia.       Negative polyphagia Negative hypoglycemia    PHYSICAL EXAM: Blood pressure 105/75, pulse 83, temperature 98.4 F (36.9 C), temperature source Oral, height 5\' 3"  (1.6 m), weight 176 lb (79.8 kg), last menstrual period 01/01/2014, SpO2 98 %. Body mass index is 31.18 kg/m. Physical Exam  Constitutional: She is oriented to person, place, and time. She appears well-developed and well-nourished.  Cardiovascular: Normal rate.  Pulmonary/Chest: Effort normal.  Musculoskeletal: Normal range of motion.  Neurological: She is oriented to person, place, and time.  Skin: Skin is warm and dry.  Psychiatric: She has a normal mood and affect. Her behavior is normal.  Vitals reviewed.   RECENT LABS AND TESTS: BMET    Component Value Date/Time   NA 145 (H) 10/14/2017 1029   K 4.2 10/14/2017 1029   CL 106 10/14/2017 1029   CO2 24 10/14/2017 1029   GLUCOSE 90 10/14/2017 1029   BUN 22 10/14/2017 1029   CREATININE 0.84 10/14/2017 1029   CALCIUM 9.4 10/14/2017 1029   GFRNONAA 77 10/14/2017 1029   GFRAA 89 10/14/2017 1029   Lab Results  Component Value Date   HGBA1C 5.5 10/14/2017   HGBA1C 5.7 (H) 03/26/2017   Lab Results  Component Value Date   INSULIN 6.8 10/14/2017   INSULIN 7.1 03/26/2017   CBC    Component Value  Date/Time   WBC 7.3 03/26/2017 1157   RBC 4.88 03/26/2017 1157   HGB 14.6 03/26/2017 1157   HCT 44.6 03/26/2017 1157   MCV 91 03/26/2017 1157   MCH 29.9 03/26/2017 1157   MCHC 32.7 03/26/2017 1157   RDW 13.2 03/26/2017 1157   LYMPHSABS 2.7 03/26/2017 1157   EOSABS 0.2 03/26/2017 1157   BASOSABS 0.0 03/26/2017 1157   Iron/TIBC/Ferritin/ %Sat No results found for: IRON, TIBC, FERRITIN, IRONPCTSAT Lipid Panel     Component Value Date/Time   CHOL 195 10/14/2017 1029   TRIG 98 10/14/2017 1029   HDL 63 10/14/2017 1029   CHOLHDL  3.1 10/14/2017 1029   LDLCALC 112 (H) 10/14/2017 1029   Hepatic Function Panel     Component Value Date/Time   PROT 6.3 10/14/2017 1029   ALBUMIN 4.5 10/14/2017 1029   AST 16 10/14/2017 1029   ALT 17 10/14/2017 1029   ALKPHOS 77 10/14/2017 1029   BILITOT 0.3 10/14/2017 1029      Component Value Date/Time   TSH 2.920 03/26/2017 1157   Results for Narez, Ana (MRN [id number]) as of 11/12/2017 15:02  Ref. Range 10/14/2017 10:29  Vitamin D, 25-Hydroxy Latest Ref Range: 30.0 - 100.0 ng/mL 51.9   ASSESSMENT AND PLAN: Prediabetes - Plan: metFORMIN (GLUCOPHAGE) 500 MG tablet  Vitamin D deficiency  At risk for diabetes mellitus  Class 1 obesity with serious comorbidity and body mass index (BMI) of 31.0 to 31.9 in adult, unspecified obesity type  PLAN:  Pre-Diabetes Dawson will continue to work on weight loss, exercise, and decreasing simple carbohydrates in her diet to help decrease the risk of diabetes. We dicussed metformin including benefits and risks. She was informed that eating too many simple carbohydrates or too many calories at one sitting increases the likelihood of GI side effects. Chrishauna agrees to continue taking metformin 500 mg q AM #30 and we will refill for 1 month. Vikkie agrees to follow up with our clinic in 3 weeks as directed to monitor her progress.  Diabetes risk counselling Nyelli was given extended (15 minutes) diabetes  prevention counseling today. She is 57 y.o. female and has risk factors for diabetes including obesity and pre-diabetes. We discussed intensive lifestyle modifications today with an emphasis on weight loss as well as increasing exercise and decreasing simple carbohydrates in her diet.  Vitamin D Deficiency Cathern was informed that low vitamin D levels contributes to fatigue and are associated with obesity, breast, and colon cancer. Ajai agrees to continue taking prescription Vit D @50 ,000 IU every 14 days and will follow up for routine testing of vitamin D, at least 2-3 times per year. She was informed of the risk of over-replacement of vitamin D and agrees to not increase her dose unless she discusses this with [place] first. Synthia agrees to follow up with our clinic in 3 weeks.  Obesity Perline is currently in the action stage of change. As such, her goal is to continue with weight loss efforts She has agreed to keep a food journal with 1000-1200 calories and 70 grams of protein daily Endya has been instructed to work up to a goal of 150 minutes of combined cardio and strengthening exercise per week for weight loss and overall health benefits. We discussed the following Behavioral Modification Strategies today: increasing lean protein intake and work on meal planning and easy cooking plans We will recheck IC at next visit.  Belisa has agreed to follow up with our clinic in 3 weeks. She was informed of the importance of frequent follow up visits to maximize her success with intensive lifestyle modifications for her multiple health conditions.   OBESITY BEHAVIORAL INTERVENTION VISIT  Today's visit was # 13   Starting weight: 174 lbs Starting date: 03/26/17 Today's weight : 176 lbs Today's date: 11/12/2017 Total lbs lost to date: 0    ASK: We discussed the diagnosis of obesity with Nira Carmon today and Suheyla agreed to give [place] permission to discuss obesity behavioral modification therapy  today.  ASSESS: Neeta has the diagnosis of obesity and her BMI today is 31.18 Ayslin is in the action stage of change  ADVISE: Nysha was educated on the multiple health risks of obesity as well as the benefit of weight loss to improve her health. She was advised of the need for long term treatment and the importance of lifestyle modifications.  AGREE: Multiple dietary modification options and treatment options were discussed and  Kea agreed to the above obesity treatment plan.  Wilhemena Durie, am acting as transcriptionist for Abby Potash, PA-C I, Abby Potash, PA-C have reviewed above note and agree with its content

## 2017-11-26 ENCOUNTER — Encounter: Payer: Self-pay | Admitting: Primary Care

## 2017-12-02 MED FILL — diazePAM 10 MG TABS: 10 | 1 days supply | Qty: 1 | Fill #0

## 2017-12-05 ENCOUNTER — Ambulatory Visit (INDEPENDENT_AMBULATORY_CARE_PROVIDER_SITE_OTHER): Payer: No Typology Code available for payment source | Admitting: Physician Assistant

## 2017-12-05 ENCOUNTER — Encounter (INDEPENDENT_AMBULATORY_CARE_PROVIDER_SITE_OTHER): Payer: Self-pay | Admitting: Physician Assistant

## 2017-12-05 VITALS — BP 104/73 | HR 85 | Temp 97.9°F | Ht 63.0 in | Wt 176.0 lb

## 2017-12-05 DIAGNOSIS — Z9189 Other specified personal risk factors, not elsewhere classified: Secondary | ICD-10-CM | POA: Diagnosis not present

## 2017-12-05 DIAGNOSIS — F3289 Other specified depressive episodes: Secondary | ICD-10-CM | POA: Diagnosis not present

## 2017-12-05 DIAGNOSIS — Z6831 Body mass index (BMI) 31.0-31.9, adult: Secondary | ICD-10-CM

## 2017-12-05 DIAGNOSIS — E669 Obesity, unspecified: Secondary | ICD-10-CM

## 2017-12-05 DIAGNOSIS — E559 Vitamin D deficiency, unspecified: Secondary | ICD-10-CM

## 2017-12-05 DIAGNOSIS — R7303 Prediabetes: Secondary | ICD-10-CM | POA: Diagnosis not present

## 2017-12-05 MED ORDER — METFORMIN HCL 500 MG PO TABS: 500.0000 mg | ORAL_TABLET | Freq: Every day | ORAL | 0 refills | Status: DC

## 2017-12-05 MED ORDER — BUPROPION HCL ER (SR) 150 MG PO TB12: 150.0000 mg | ORAL_TABLET | Freq: Every day | ORAL | 0 refills | Status: DC

## 2017-12-05 MED ORDER — VITAMIN D (ERGOCALCIFEROL) 1.25 MG (50000 UNIT) PO CAPS: 50000.0000 [IU] | ORAL_CAPSULE | ORAL | 0 refills | Status: DC

## 2017-12-05 MED FILL — BUPROPION HCL SR 150 MG TAB: 150 | 30 days supply | Qty: 30 | Fill #0

## 2017-12-05 MED FILL — VIT D2 1.25 MG (50,000 UNIT: 1.25 MG | 28 days supply | Qty: 2 | Fill #0

## 2017-12-05 MED FILL — metFORMIN HCL 500 MG TABS: 500 | 30 days supply | Qty: 30 | Fill #0

## 2017-12-09 ENCOUNTER — Other Ambulatory Visit (INDEPENDENT_AMBULATORY_CARE_PROVIDER_SITE_OTHER): Payer: Self-pay | Admitting: Physician Assistant

## 2017-12-09 DIAGNOSIS — F3289 Other specified depressive episodes: Secondary | ICD-10-CM

## 2017-12-09 NOTE — Progress Notes (Signed)
Office: [phone]  /  Fax: [phone]   HPI:   Chief Complaint: OBESITY Tina Chavez is here to discuss her progress with her obesity treatment plan. She is on the  keep a food journal with 1000-1200 calories and 70 g of protein  daily and is following her eating plan approximately 90 % of the time. She states she is exercising 0 minutes 0 times per week. Tina Chavez reports that she is eating peanut butter toast for breakfast and yogurt for lunch. She is not getting enough protein through the day.  Her weight is 176 lb (79.8 kg) today and she has maintained her weight since her last visit. She has lost 0 lbs since starting treatment with [place].  Pre-Diabetes Tina Chavez has a diagnosis of prediabetes based on her elevated HgA1c and was informed this puts her at greater risk of developing diabetes. She is taking metformin currently and continues to work on diet and exercise to decrease risk of diabetes. She denies nausea, vomiting, diarrhea, polyphagia or hypoglycemia.  Vitamin D deficiency Tina Chavez has a diagnosis of vitamin D deficiency. She is currently taking vit D and denies nausea, vomiting or muscle weakness.  Ref. Range 10/14/2017 10:29  Vitamin D, 25-Hydroxy Latest Ref Range: 30.0 - 100.0 ng/mL 51.9   Depression with emotional eating behaviors Tina Chavez is struggling with emotional eating and using food for comfort to the extent that it is negatively impacting her health. She often snacks when she is not hungry. Tina Chavez sometimes feels she is out of control and then feels guilty that she made poor food choices. She has been working on behavior modification techniques to help reduce her emotional eating and has been somewhat successful. She denies cravings. Her blood pressure is normal. She shows no sign of suicidal or homicidal ideations.  Depression screen PHQ 2/9 03/26/2017  Decreased Interest 1  Down, Depressed, Hopeless 0  PHQ - 2 Score 1  Altered sleeping 1  Tired, decreased energy 1  Change in  appetite 1  Feeling bad or failure about yourself  0  Trouble concentrating 0  Moving slowly or fidgety/restless 0  Suicidal thoughts 0  PHQ-9 Score 4  Difficult doing work/chores Not difficult at all   At risk for diabetes Tina Chavez is at higher than averagerisk for developing diabetes due to her obesity. She currently denies polyuria or polydipsia.  ALLERGIES: Allergies  Allergen Reactions  . Sulfa Antibiotics Anaphylaxis  . Pollen Extract     MEDICATIONS: Current Outpatient Medications on File Prior to Visit  Medication Sig Dispense Refill  . Calcium Carbonate-Vitamin D3 (CALCIUM 600-D) 600-400 MG-UNIT TABS Takes 2 tablets daily.    [name] conjugated estrogens (PREMARIN) vaginal cream 1/2 gram vaginally twice weekly 30 g 2  . diazepam (VALIUM) 10 MG tablet once. With dental appt  0  . Docusate Calcium (STOOL SOFTENER PO) Take by mouth daily as needed.    . Insulin Pen Needle (BD PEN NEEDLE NANO U/F) 32G X 4 MM MISC 1 each by Does not apply route daily. 50 each 0  . Liraglutide -Weight Management (SAXENDA) 18 MG/3ML SOPN Inject 3 mg into the skin daily. 5 pen 0  . loratadine (CLARITIN) 10 MG tablet Take 10 mg by mouth daily.    . Multiple Vitamin (MULTIVITAMIN) tablet Take 1 tablet by mouth daily.    . Omega-3 Fatty Acids (FISH OIL) 1000 MG CAPS 2 capsules daily.    [name] omeprazole (PRILOSEC) 10 MG capsule Take 20 mg by mouth daily.     [name]  polyethylene glycol powder (GLYCOLAX/MIRALAX) powder Take 1 Container by mouth daily.     No current facility-administered medications on file prior to visit.     PAST MEDICAL HISTORY: Past Medical History:  Diagnosis Date  . Abnormal Pap smear of cervix    age 22  . Abnormal uterine bleeding   . Allergy   . Chronic constipation   . Depression   . Endometriosis   . GERD (gastroesophageal reflux disease)   . Hemorrhoids   . Hyperlipidemia   . Obesity   . Osteopenia   . Pre-diabetes     PAST SURGICAL HISTORY: Past Surgical History:    Procedure Laterality Date  . ABDOMINOPLASTY  2006   Dr. Lesli Albee  . BREAST REDUCTION SURGERY  2006  . [place] OF UTERUS  2016  . FRACTURE SURGERY  1972   Arm   . GYNECOLOGIC CRYOSURGERY     at age 13  . HYSTEROSCOPY  2006  . REDUCTION MAMMAPLASTY      SOCIAL HISTORY: Social History   Tobacco Use  . Smoking status: Never Smoker  . Smokeless tobacco: Never Used  Substance Use Topics  . Alcohol use: No  . Drug use: No    FAMILY HISTORY: Family History  Problem Relation Age of Onset  . Hyperlipidemia Mother   . Stroke Mother   . Diabetes Mother   . Hypertension Mother   . Kidney disease Mother   . Thyroid disease Mother   . Eating disorder Mother   . Obesity Mother   . Arthritis Father   . Heart disease Father   . Sudden death Father   . Hyperlipidemia Maternal Grandmother   . Heart disease Maternal Grandmother   . Mental illness Maternal Grandmother   . Alcohol abuse Maternal Grandfather   . Arthritis Paternal Grandmother   . Hyperlipidemia Paternal Grandmother   . Heart disease Paternal Grandmother   . Alcohol abuse Paternal Grandfather     ROS: Review of Systems  Constitutional: Negative for weight loss.  Gastrointestinal: Negative for diarrhea, nausea and vomiting.  Musculoskeletal:       Negative for muscle weakness  Endo/Heme/Allergies: Negative for polydipsia.       Negative for polyuria Negative for polyphagia Negative for hypoglycemia  Psychiatric/Behavioral: Positive for depression. Negative for suicidal ideas.       Negative for homicidal ideations    PHYSICAL EXAM: Blood pressure 104/73, pulse 85, temperature 97.9 F (36.6 C), temperature source Oral, height 5\' 3"  (1.6 m), weight 176 lb (79.8 kg), last menstrual period 01/01/2014, SpO2 97 %. Body mass index is 31.18 kg/m. Physical Exam  Constitutional: She is oriented to person, place, and time. She appears well-developed and well-nourished.  HENT:  Head: Normocephalic.   Eyes: Pupils are equal, round, and reactive to light.  Neck: Normal range of motion.  Cardiovascular: Normal rate.  Pulmonary/Chest: Effort normal.  Musculoskeletal: Normal range of motion.  Neurological: She is alert and oriented to person, place, and time.  Skin: Skin is warm and dry.  Psychiatric: She has a normal mood and affect. Her behavior is normal.  Vitals reviewed.   RECENT LABS AND TESTS: BMET    Component Value Date/Time   NA 145 (H) 10/14/2017 1029   K 4.2 10/14/2017 1029   CL 106 10/14/2017 1029   CO2 24 10/14/2017 1029   GLUCOSE 90 10/14/2017 1029   BUN 22 10/14/2017 1029   CREATININE 0.84 10/14/2017 1029   CALCIUM 9.4 10/14/2017 1029   GFRNONAA  77 10/14/2017 1029   GFRAA 89 10/14/2017 1029   Lab Results  Component Value Date   HGBA1C 5.5 10/14/2017   HGBA1C 5.7 (H) 03/26/2017   Lab Results  Component Value Date   INSULIN 6.8 10/14/2017   INSULIN 7.1 03/26/2017   CBC    Component Value Date/Time   WBC 7.3 03/26/2017 1157   RBC 4.88 03/26/2017 1157   HGB 14.6 03/26/2017 1157   HCT 44.6 03/26/2017 1157   MCV 91 03/26/2017 1157   MCH 29.9 03/26/2017 1157   MCHC 32.7 03/26/2017 1157   RDW 13.2 03/26/2017 1157   LYMPHSABS 2.7 03/26/2017 1157   EOSABS 0.2 03/26/2017 1157   BASOSABS 0.0 03/26/2017 1157   Iron/TIBC/Ferritin/ %Sat No results found for: IRON, TIBC, FERRITIN, IRONPCTSAT Lipid Panel     Component Value Date/Time   CHOL 195 10/14/2017 1029   TRIG 98 10/14/2017 1029   HDL 63 10/14/2017 1029   CHOLHDL 3.1 10/14/2017 1029   LDLCALC 112 (H) 10/14/2017 1029   Hepatic Function Panel     Component Value Date/Time   PROT 6.3 10/14/2017 1029   ALBUMIN 4.5 10/14/2017 1029   AST 16 10/14/2017 1029   ALT 17 10/14/2017 1029   ALKPHOS 77 10/14/2017 1029   BILITOT 0.3 10/14/2017 1029      Component Value Date/Time   TSH 2.920 03/26/2017 1157    ASSESSMENT AND PLAN: Prediabetes - Plan: metFORMIN (GLUCOPHAGE) 500 MG tablet  Vitamin D  deficiency - Plan: Vitamin D, Ergocalciferol, (DRISDOL) 1.25 MG (50000 UT) CAPS capsule  Other depression - with emotional eating - Plan: buPROPion (WELLBUTRIN SR) 150 MG 12 hr tablet  At risk for diabetes mellitus  Class 1 obesity with serious comorbidity and body mass index (BMI) of 31.0 to 31.9 in adult, unspecified obesity type  PLAN: Pre-Diabetes Tina Chavez will continue to work on weight loss, exercise, and decreasing simple carbohydrates in her diet to help decrease the risk of diabetes. We dicussed metformin including benefits and risks. She was informed that eating too many simple carbohydrates or too many calories at one sitting increases the likelihood of GI side effects. Tina Chavez agrees to continue metformin 500 mg daily #30 with no refills. Tina Chavez agreed to follow up with [place] as directed to monitor her progress.  Vitamin D Deficiency Tina Chavez was informed that low vitamin D levels contributes to fatigue and are associated with obesity, breast, and colon cancer. She agrees to continue to take prescription Vit D @50 ,000 IU every week #4 with no refills and will follow up for routine testing of vitamin D, at least 2-3 times per year. She was informed of the risk of over-replacement of vitamin D and agrees to not increase her dose unless she discusses this with [place] first. Agrees to follow up with our clinic as directed.   Depression with Emotional Eating Behaviors We discussed behavior modification techniques today to help Tina Chavez deal with her emotional eating and depression. She has agreed to continue Wellbutrin SR 150 mg qd #30 with no refills and agreed to follow up as directed.  Diabetes risk counseling Tina Chavez was given extended (15 minutes) diabetes prevention counseling today. She is 57 y.o. female and has risk factors for diabetes including obesity. We discussed intensive lifestyle modifications today with an emphasis on weight loss as well as increasing exercise and decreasing simple  carbohydrates in her diet.  Obesity Tina Chavez is currently in the action stage of change. As such, her goal is to continue with weight loss efforts  She has agreed to keep a food journal with 1200 calories and 70g of protein  daily.  Reinforced the importance of getting adequate amounts of protein.  Tina Chavez has been instructed to work up to a goal of 150 minutes of combined cardio and strengthening exercise per week for weight loss and overall health benefits. We discussed the following Behavioral Modification Strategies today: increasing lean protein intake and no skipping meals.    Tina Chavez has agreed to follow up with our clinic in 3 weeks. She was informed of the importance of frequent follow up visits to maximize her success with intensive lifestyle modifications for her multiple health conditions.   OBESITY BEHAVIORAL INTERVENTION VISIT  Today's visit was # 14   Starting weight: 174 lb Starting date: 03/23/17 Today's weight : Weight: 176 lb (79.8 kg)  Today's date: 12/05/17 Total lbs lost to date: 0   ASK: We discussed the diagnosis of obesity with Tina Chavez today and Tina Chavez agreed to give [place] permission to discuss obesity behavioral modification therapy today.  ASSESS: Tina Chavez has the diagnosis of obesity and her BMI today is 31.18 Tina Chavez is in the action stage of change   ADVISE: Tina Chavez was educated on the multiple health risks of obesity as well as the benefit of weight loss to improve her health. She was advised of the need for long term treatment and the importance of lifestyle modifications to improve her current health and to decrease her risk of future health problems.  AGREE: Multiple dietary modification options and treatment options were discussed and  Tina Chavez agreed to follow the recommendations documented in the above note.  ARRANGE: Tina Chavez was educated on the importance of frequent visits to treat obesity as outlined per CMS and USPSTF guidelines and agreed to schedule her  next follow up appointment today.  Leary Roca, am acting as transcriptionist for Abby Potash, PA-C I, Abby Potash, PA-C have reviewed above note and agree with its content

## 2018-01-02 ENCOUNTER — Ambulatory Visit (INDEPENDENT_AMBULATORY_CARE_PROVIDER_SITE_OTHER): Payer: No Typology Code available for payment source | Admitting: Physician Assistant

## 2018-01-02 ENCOUNTER — Encounter (INDEPENDENT_AMBULATORY_CARE_PROVIDER_SITE_OTHER): Payer: Self-pay | Admitting: Physician Assistant

## 2018-01-02 VITALS — BP 113/77 | HR 82 | Temp 97.7°F | Ht 63.0 in | Wt 176.0 lb

## 2018-01-02 DIAGNOSIS — E559 Vitamin D deficiency, unspecified: Secondary | ICD-10-CM | POA: Diagnosis not present

## 2018-01-02 DIAGNOSIS — Z6831 Body mass index (BMI) 31.0-31.9, adult: Secondary | ICD-10-CM

## 2018-01-02 DIAGNOSIS — R7303 Prediabetes: Secondary | ICD-10-CM

## 2018-01-02 DIAGNOSIS — Z9189 Other specified personal risk factors, not elsewhere classified: Secondary | ICD-10-CM | POA: Diagnosis not present

## 2018-01-02 DIAGNOSIS — E669 Obesity, unspecified: Secondary | ICD-10-CM

## 2018-01-02 MED ORDER — METFORMIN HCL 500 MG PO TABS: 500.0000 mg | ORAL_TABLET | Freq: Every day | ORAL | 0 refills | Status: DC

## 2018-01-02 MED ORDER — VITAMIN D (ERGOCALCIFEROL) 1.25 MG (50000 UNIT) PO CAPS: 50000.0000 [IU] | ORAL_CAPSULE | ORAL | 0 refills | Status: DC

## 2018-01-02 MED FILL — VIT D2 1.25 MG (50,000 UNIT: 1.25 MG | 28 days supply | Qty: 2 | Fill #0

## 2018-01-02 MED FILL — metFORMIN HCL 500 MG TABS: 500 | 30 days supply | Qty: 30 | Fill #0

## 2018-01-02 NOTE — Progress Notes (Signed)
Office: [phone]  /  Fax: [phone]   HPI:   Chief Complaint: OBESITY Tina Chavez is here to discuss her progress with her obesity treatment plan. She is journaling with 1200 calories and 70 protein daily and is following her eating plan approximately 50 % of the time. She states she is exercising 0 minutes 0 times per week. Tina Chavez was in [place] and did not eat on plan. She states that is ready to get back on track. Her weight is 176 lb (79.8 kg) today and has not lost weight since her last visit. She has lost 0 lbs since starting treatment with [place].  Vitamin D deficiency Tina Chavez has a diagnosis of vitamin D deficiency. She is currently taking prescription Vit D and denies nausea, vomiting or muscle weakness.  Pre-Diabetes Tina Chavez has a diagnosis of prediabetes based on her elevated Hgb A1c and was informed this puts her at greater risk of developing diabetes. She denies nausea, vomiting, or diarrhea on metformin. She continues to work on diet and exercise to decrease risk of diabetes. She denies polyphagia or hypoglycemia.  At risk for diabetes Tina Chavez is at higher than average risk for developing diabetes due to her obesity. She currently denies polyuria or polydipsia.  ASSESSMENT AND PLAN:  Vitamin D deficiency - Plan: Vitamin D, Ergocalciferol, (DRISDOL) 1.25 MG (50000 UT) CAPS capsule  Prediabetes - Plan: metFORMIN (GLUCOPHAGE) 500 MG tablet  At risk for diabetes mellitus  Class 1 obesity with serious comorbidity and body mass index (BMI) of 31.0 to 31.9 in adult, unspecified obesity type  PLAN:  Vitamin D Deficiency Tina Chavez was informed that low vitamin D levels contributes to fatigue and are associated with obesity, breast, and colon cancer. Tina Chavez agrees to continue taking prescription Vit D 50,000 IU every 14 days #2 and will follow up for routine testing of vitamin D, at least 2-3 times per year. She was informed of the risk of over-replacement of vitamin D and agrees to not  increase her dose unless she discusses this with [place] first. Tina Chavez agrees to follow up with our clinic in 3 weeks.  Pre-Diabetes Tina Chavez will continue to work on weight loss, exercise, and decreasing simple carbohydrates in her diet to help decrease the risk of diabetes. We dicussed metformin including benefits and risks. She was informed that eating too many simple carbohydrates or too many calories at one sitting increases the likelihood of GI side effects. Tina Chavez agrees to continue taking metformin 500 mg q AM #30 and we will refill for 1 month. Tina Chavez agrees to follow up with our clinic in 3 weeks to monitor her progress.  Diabetes risk counseling Tina Chavez was given extended (15 minutes) diabetes prevention counseling today. She is 58 y.o. female and has risk factors for diabetes including obesity. We discussed intensive lifestyle modifications today with an emphasis on weight loss as well as increasing exercise and decreasing simple carbohydrates in her diet.  Obesity Tina Chavez is currently in the action stage of change. As such, her goal is to continue with weight loss efforts She has agreed to follow the Category 2 plan Tina Chavez has been instructed to work up to a goal of 150 minutes of combined cardio and strengthening exercise per week for weight loss and overall health benefits. We discussed the following Behavioral Modification Strategies today: work on meal planning and easy cooking plans and planning for success.  Tina Chavez has agreed to follow up with our clinic in 3 weeks. She was informed of the importance of frequent follow  up visits to maximize her success with intensive lifestyle modifications for her multiple health conditions.  ALLERGIES: Allergies  Allergen Reactions  . Sulfa Antibiotics Anaphylaxis  . Pollen Extract     MEDICATIONS: Current Outpatient Medications on File Prior to Visit  Medication Sig Dispense Refill  . buPROPion (WELLBUTRIN SR) 150 MG 12 hr tablet Take 1 tablet (150 mg  total) by mouth daily. 30 tablet 0  . buPROPion (WELLBUTRIN SR) 150 MG 12 hr tablet TAKE 1 TABLET BY MOUTH DAILY. 30 tablet 0  . Calcium Carbonate-Vitamin D3 (CALCIUM 600-D) 600-400 MG-UNIT TABS Takes 2 tablets daily.    [name] conjugated estrogens (PREMARIN) vaginal cream 1/2 gram vaginally twice weekly 30 g 2  . diazepam (VALIUM) 10 MG tablet once. With dental appt  0  . Docusate Calcium (STOOL SOFTENER PO) Take by mouth daily as needed.    . Insulin Pen Needle (BD PEN NEEDLE NANO U/F) 32G X 4 MM MISC 1 each by Does not apply route daily. 50 each 0  . Liraglutide -Weight Management (SAXENDA) 18 MG/3ML SOPN Inject 3 mg into the skin daily. 5 pen 0  . loratadine (CLARITIN) 10 MG tablet Take 10 mg by mouth daily.    . Multiple Vitamin (MULTIVITAMIN) tablet Take 1 tablet by mouth daily.    . Omega-3 Fatty Acids (FISH OIL) 1000 MG CAPS 2 capsules daily.    [name] omeprazole (PRILOSEC) 10 MG capsule Take 20 mg by mouth daily.     . polyethylene glycol powder (GLYCOLAX/MIRALAX) powder Take 1 Container by mouth daily.     No current facility-administered medications on file prior to visit.     PAST MEDICAL HISTORY: Past Medical History:  Diagnosis Date  . Abnormal Pap smear of cervix    age 50  . Abnormal uterine bleeding   . Allergy   . Chronic constipation   . Depression   . Endometriosis   . GERD (gastroesophageal reflux disease)   . Hemorrhoids   . Hyperlipidemia   . Obesity   . Osteopenia   . Pre-diabetes     PAST SURGICAL HISTORY: Past Surgical History:  Procedure Laterality Date  . ABDOMINOPLASTY  2006   Dr. Lesli Albee  . BREAST REDUCTION SURGERY  2006  . [place] OF UTERUS  2016  . FRACTURE SURGERY  1972   Arm   . GYNECOLOGIC CRYOSURGERY     at age 12  . HYSTEROSCOPY  2006  . REDUCTION MAMMAPLASTY      SOCIAL HISTORY: Social History   Tobacco Use  . Smoking status: Never Smoker  . Smokeless tobacco: Never Used  Substance Use Topics  . Alcohol use: No  .  Drug use: No    FAMILY HISTORY: Family History  Problem Relation Age of Onset  . Hyperlipidemia Mother   . Stroke Mother   . Diabetes Mother   . Hypertension Mother   . Kidney disease Mother   . Thyroid disease Mother   . Eating disorder Mother   . Obesity Mother   . Arthritis Father   . Heart disease Father   . Sudden death Father   . Hyperlipidemia Maternal Grandmother   . Heart disease Maternal Grandmother   . Mental illness Maternal Grandmother   . Alcohol abuse Maternal Grandfather   . Arthritis Paternal Grandmother   . Hyperlipidemia Paternal Grandmother   . Heart disease Paternal Grandmother   . Alcohol abuse Paternal Grandfather     ROS: Review of Systems  Constitutional: Negative for  weight loss.  Gastrointestinal: Negative for diarrhea, nausea and vomiting.  Genitourinary: Negative for frequency.  Musculoskeletal:       Negative for muscle weakness  Endo/Heme/Allergies: Negative for polydipsia.       Negative for polyphagia Negative for hypoglycemia    PHYSICAL EXAM: Blood pressure 113/77, pulse 82, temperature 97.7 F (36.5 C), temperature source Oral, height 5\' 3"  (1.6 m), weight 176 lb (79.8 kg), last menstrual period 01/01/2014, SpO2 100 %. Body mass index is 31.18 kg/m. Physical Exam Vitals signs reviewed.  Constitutional:      Appearance: Normal appearance. She is obese.  Cardiovascular:     Rate and Rhythm: Normal rate.     Pulses: Normal pulses.  Pulmonary:     Effort: Pulmonary effort is normal.  Musculoskeletal: Normal range of motion.  Skin:    General: Skin is warm and dry.  Neurological:     Mental Status: She is alert and oriented to person, place, and time.  Psychiatric:        Mood and Affect: Mood normal.        Behavior: Behavior normal.     RECENT LABS AND TESTS: BMET    Component Value Date/Time   NA 145 (H) 10/14/2017 1029   K 4.2 10/14/2017 1029   CL 106 10/14/2017 1029   CO2 24 10/14/2017 1029   GLUCOSE 90  10/14/2017 1029   BUN 22 10/14/2017 1029   CREATININE 0.84 10/14/2017 1029   CALCIUM 9.4 10/14/2017 1029   GFRNONAA 77 10/14/2017 1029   GFRAA 89 10/14/2017 1029   Lab Results  Component Value Date   HGBA1C 5.5 10/14/2017   HGBA1C 5.7 (H) 03/26/2017   Lab Results  Component Value Date   INSULIN 6.8 10/14/2017   INSULIN 7.1 03/26/2017   CBC    Component Value Date/Time   WBC 7.3 03/26/2017 1157   RBC 4.88 03/26/2017 1157   HGB 14.6 03/26/2017 1157   HCT 44.6 03/26/2017 1157   MCV 91 03/26/2017 1157   MCH 29.9 03/26/2017 1157   MCHC 32.7 03/26/2017 1157   RDW 13.2 03/26/2017 1157   LYMPHSABS 2.7 03/26/2017 1157   EOSABS 0.2 03/26/2017 1157   BASOSABS 0.0 03/26/2017 1157   Iron/TIBC/Ferritin/ %Sat No results found for: IRON, TIBC, FERRITIN, IRONPCTSAT Lipid Panel     Component Value Date/Time   CHOL 195 10/14/2017 1029   TRIG 98 10/14/2017 1029   HDL 63 10/14/2017 1029   CHOLHDL 3.1 10/14/2017 1029   LDLCALC 112 (H) 10/14/2017 1029   Hepatic Function Panel     Component Value Date/Time   PROT 6.3 10/14/2017 1029   ALBUMIN 4.5 10/14/2017 1029   AST 16 10/14/2017 1029   ALT 17 10/14/2017 1029   ALKPHOS 77 10/14/2017 1029   BILITOT 0.3 10/14/2017 1029      Component Value Date/Time   TSH 2.920 03/26/2017 1157     Ref. Range 10/14/2017 10:29  Vitamin D, 25-Hydroxy Latest Ref Range: 30.0 - 100.0 ng/mL 51.9     OBESITY BEHAVIORAL INTERVENTION VISIT  Today's visit was # 15   Starting weight: 174 lbs Starting date: 03/26/2017 Today's weight : 176 lbs Today's date: 01/02/2018 Total lbs lost to date: 0  ASK: We discussed the diagnosis of obesity with Tina Chavez today and Tina Chavez agreed to give [place] permission to discuss obesity behavioral modification therapy today.  ASSESS: Tina Chavez has the diagnosis of obesity and her BMI today is 31.18 Tina Chavez is in the action stage of change  ADVISE: Tina Chavez was educated on the multiple health risks of obesity as well as  the benefit of weight loss to improve her health. She was advised of the need for long term treatment and the importance of lifestyle modifications to improve her current health and to decrease her risk of future health problems.  AGREE: Multiple dietary modification options and treatment options were discussed and  Tina Chavez agreed to follow the recommendations documented in the above note.  ARRANGE: Tina Chavez was educated on the importance of frequent visits to treat obesity as outlined per CMS and USPSTF guidelines and agreed to schedule her next follow up appointment today.  I, Tammy Wysor, am acting as Location manager for [employer], PA-C I, Abby Potash, PA-C have reviewed above note and agree with its content

## 2018-01-07 MED FILL — BUPROPION HCL SR 150 MG TAB: 150 | 30 days supply | Qty: 30 | Fill #0

## 2018-01-09 ENCOUNTER — Other Ambulatory Visit (INDEPENDENT_AMBULATORY_CARE_PROVIDER_SITE_OTHER): Payer: Self-pay | Admitting: Family Medicine

## 2018-01-09 MED FILL — UNIFINE PENTIPS 6MM 31G: 31G X 6 MM | 90 days supply | Qty: 100 | Fill #0

## 2018-01-22 ENCOUNTER — Encounter (INDEPENDENT_AMBULATORY_CARE_PROVIDER_SITE_OTHER): Payer: Self-pay | Admitting: Family Medicine

## 2018-01-22 ENCOUNTER — Ambulatory Visit (INDEPENDENT_AMBULATORY_CARE_PROVIDER_SITE_OTHER): Payer: No Typology Code available for payment source | Admitting: Family Medicine

## 2018-01-22 VITALS — BP 114/77 | HR 82 | Temp 97.8°F | Ht 63.0 in | Wt 175.0 lb

## 2018-01-22 DIAGNOSIS — Z9189 Other specified personal risk factors, not elsewhere classified: Secondary | ICD-10-CM | POA: Diagnosis not present

## 2018-01-22 DIAGNOSIS — E88819 Insulin resistance, unspecified: Secondary | ICD-10-CM

## 2018-01-22 DIAGNOSIS — K5909 Other constipation: Secondary | ICD-10-CM | POA: Diagnosis not present

## 2018-01-22 DIAGNOSIS — E66811 Obesity, class 1: Secondary | ICD-10-CM

## 2018-01-22 DIAGNOSIS — E559 Vitamin D deficiency, unspecified: Secondary | ICD-10-CM

## 2018-01-22 DIAGNOSIS — E7849 Other hyperlipidemia: Secondary | ICD-10-CM | POA: Diagnosis not present

## 2018-01-22 DIAGNOSIS — E8881 Metabolic syndrome: Secondary | ICD-10-CM | POA: Diagnosis not present

## 2018-01-22 DIAGNOSIS — E669 Obesity, unspecified: Secondary | ICD-10-CM

## 2018-01-22 DIAGNOSIS — Z6831 Body mass index (BMI) 31.0-31.9, adult: Secondary | ICD-10-CM

## 2018-01-22 MED ORDER — LIRAGLUTIDE -WEIGHT MANAGEMENT 18 MG/3ML ~~LOC~~ SOPN: 3.0000 mg | PEN_INJECTOR | Freq: Every day | SUBCUTANEOUS | 0 refills | Status: DC

## 2018-01-22 MED ORDER — VITAMIN D (ERGOCALCIFEROL) 1.25 MG (50000 UNIT) PO CAPS: 50000.0000 [IU] | ORAL_CAPSULE | ORAL | 0 refills | Status: DC

## 2018-01-22 MED FILL — SAXENDA 18 MG/3 ML PEN: 18 | 30 days supply | Qty: 15 | Fill #0

## 2018-01-22 NOTE — Progress Notes (Signed)
Office: [phone]  /  Fax: [phone]   HPI:   Chief Complaint: OBESITY Tina Chavez is here to discuss her progress with her obesity treatment plan. She is on the Category 2 plan and is following her eating plan approximately 95 % of the time. She states she is exercising 0 minutes 0 times per week. Tina Chavez is taking 1.2mg  of Saxenda daily. She feels it suppresses her appetite and cravings. Weight loss has been inadequate with [place] and insurance will likely not provide prior authorization when current PA expires 01/31/18. [name] She denies nausea and admits constipation. She feels that she is eating too much bread and does not think her body handles carbohydrates very well.   Her weight is 175 lb (79.4 kg) today and has had a weight loss of 1 pounds over a period of 3 weeks since her last visit. She has lost 0 lbs since starting treatment with [place].  Vitamin D deficiency [name] has a diagnosis of vitamin D deficiency. She is currently taking vit D and is at goal. Her last vitamin D level was 51.9 on 10/14/17. She denies nausea, vomiting, or muscle weakness.  Constipation [name] notes constipation is a chronic problem for her, worse since attempting weight loss. She states BM are less frequent. She denies hematochezia or melena.   At risk for diabetes [name] is at higher than average risk for developing diabetes due to her obesity. She currently denies polyuria or polydipsia.  Hyperlipidemia [name] has hyperlipidemia and has been trying to improve her cholesterol levels with intensive lifestyle modification including a low saturated fat diet, exercise and weight loss. She is not on a statin and denies any chest pain or shortness of breath.  Insulin Resistance [name] has a diagnosis of insulin resistance based on her elevated fasting insulin level >5. Although [name]'s blood glucose readings are still under good control, insulin resistance puts her at greater risk of metabolic syndrome and diabetes. She  is taking metformin currently and continues to work on diet and exercise to decrease risk of diabetes. She denies polyphagia.  ASSESSMENT AND PLAN:  Vitamin D deficiency - Plan: VITAMIN D 25 Hydroxy (Vit-D Deficiency, Fractures), Vitamin D, Ergocalciferol, (DRISDOL) 1.25 MG (50000 UT) CAPS capsule  Other constipation  Other hyperlipidemia - Plan: Lipid Panel With LDL/HDL Ratio  Insulin resistance - Plan: Comprehensive metabolic panel, Hemoglobin A1c, Insulin, random  At risk for diabetes mellitus  Class 1 obesity with serious comorbidity and body mass index (BMI) of 31.0 to 31.9 in adult, unspecified obesity type - Plan: Liraglutide -Weight Management (SAXENDA) 18 MG/3ML SOPN  PLAN:  Vitamin D Deficiency [name] was informed that low vitamin D levels contributes to fatigue and are associated with obesity, breast, and colon cancer. She agrees to continue to take prescription Vit D @50 ,000 IU every week #4 with no refills and will follow up for routine testing of vitamin D, at least 2-3 times per year. She was informed of the risk of over-replacement of vitamin D and agrees to not increase her dose unless she discusses this with [place] first. We will recheck labs today and she agrees to follow up in 3 weeks.  Constipation [name] was informed decrease bowel movement frequency is normal while losing weight, but stools should not be hard or painful. She was advised to increase her H20 intake, vegetables, and work on increasing her fiber intake. High fiber foods were discussed today. She will take Benefiber daily. [name] will follow up as directed.  Hyperlipidemia [name] was informed  of the American Heart Association Guidelines emphasizing intensive lifestyle modifications as the first line treatment for hyperlipidemia. We discussed many lifestyle modifications today in depth, and [name] will continue to work on decreasing saturated fats such as fatty red meat, butter and many fried foods. She will also  increase vegetables and lean protein in her diet and continue to work on exercise and weight loss efforts. A FLP was ordered today and Mosella agreed to follow up at the agreed upon time.  Insulin Resistance [name] will continue to work on weight loss, exercise, and decreasing simple carbohydrates in her diet to help decrease the risk of diabetes. She was informed that eating too many simple carbohydrates or too many calories at one sitting increases the likelihood of GI side effects.We ordered an A1c, a fasting glucose, and a fasting Insulin today. [name] agreed to follow up with [place] as directed to monitor her progress.  Diabetes risk counseling [name] was given extended (15 minutes) diabetes prevention counseling today. She is 58 y.o. female and has risk factors for diabetes including insulin resistance and obesity. We discussed intensive lifestyle modifications today with an emphasis on weight loss as well as increasing exercise and decreasing simple carbohydrates in her diet.  Obesity [name] is currently in the action stage of change. As such, her goal is to continue with weight loss efforts. She has agreed to follow the Category 2 plan. She agreed to take out bread at lunch. [name] has been instructed to work up to a goal of 150 minutes of combined cardio and strengthening exercise per week for weight loss and overall health benefits. We discussed the following Behavioral Modification Strategies today: increasing vegetables, increase H2O intake, and planning for success. We discussed various medication options to help [name] with her weight loss efforts and we both agreed to continue Saxenda 1.2 mg daily and she will follow up in 3 weeks.  [name] was refilled today.  [name] has agreed to follow up with our clinic in 3 weeks. She was informed of the importance of frequent follow up visits to maximize her success with intensive lifestyle modifications for her multiple health  conditions.  ALLERGIES: Allergies  Allergen Reactions  . Sulfa Antibiotics Anaphylaxis  . Pollen Extract     MEDICATIONS: Current Outpatient Medications on File Prior to Visit  Medication Sig Dispense Refill  . buPROPion (WELLBUTRIN SR) 150 MG 12 hr tablet Take 1 tablet (150 mg total) by mouth daily. 30 tablet 0  . buPROPion (WELLBUTRIN SR) 150 MG 12 hr tablet TAKE 1 TABLET BY MOUTH DAILY. 30 tablet 0  . Calcium Carbonate-Vitamin D3 (CALCIUM 600-D) 600-400 MG-UNIT TABS Takes 2 tablets daily.    [name] conjugated estrogens (PREMARIN) vaginal cream 1/2 gram vaginally twice weekly 30 g 2  . diazepam (VALIUM) 10 MG tablet once. With dental appt  0  . Docusate Calcium (STOOL SOFTENER PO) Take by mouth daily as needed.    . Insulin Pen Needle (BD PEN NEEDLE NANO U/F) 32G X 4 MM MISC 1 each by Does not apply route daily. 50 each 0  . loratadine (CLARITIN) 10 MG tablet Take 10 mg by mouth daily.    . metFORMIN (GLUCOPHAGE) 500 MG tablet Take 1 tablet (500 mg total) by mouth daily with breakfast. 30 tablet 0  . Multiple Vitamin (MULTIVITAMIN) tablet Take 1 tablet by mouth daily.    . Omega-3 Fatty Acids (FISH OIL) 1000 MG CAPS 2 capsules daily.    [name] omeprazole (PRILOSEC) 10 MG capsule Take  20 mg by mouth daily.     . polyethylene glycol powder (GLYCOLAX/MIRALAX) powder Take 1 Container by mouth daily.    [name] UNIFINE PENTIPS 31G X 6 MM MISC USE TO INJECT SAXENDA DAILY 100 each 0   No current facility-administered medications on file prior to visit.     PAST MEDICAL HISTORY: Past Medical History:  Diagnosis Date  . Abnormal Pap smear of cervix    age 73  . Abnormal uterine bleeding   . Allergy   . Chronic constipation   . Depression   . Endometriosis   . GERD (gastroesophageal reflux disease)   . Hemorrhoids   . Hyperlipidemia   . Obesity   . Osteopenia   . Pre-diabetes     PAST SURGICAL HISTORY: Past Surgical History:  Procedure Laterality Date  . ABDOMINOPLASTY  2006   Dr.  Lesli Albee  . BREAST REDUCTION SURGERY  2006  . [place] OF UTERUS  2016  . FRACTURE SURGERY  1972   Arm   . GYNECOLOGIC CRYOSURGERY     at age 58  . HYSTEROSCOPY  2006  . REDUCTION MAMMAPLASTY      SOCIAL HISTORY: Social History   Tobacco Use  . Smoking status: Never Smoker  . Smokeless tobacco: Never Used  Substance Use Topics  . Alcohol use: No  . Drug use: No    FAMILY HISTORY: Family History  Problem Relation Age of Onset  . Hyperlipidemia Mother   . Stroke Mother   . Diabetes Mother   . Hypertension Mother   . Kidney disease Mother   . Thyroid disease Mother   . Eating disorder Mother   . Obesity Mother   . Arthritis Father   . Heart disease Father   . Sudden death Father   . Hyperlipidemia Maternal Grandmother   . Heart disease Maternal Grandmother   . Mental illness Maternal Grandmother   . Alcohol abuse Maternal Grandfather   . Arthritis Paternal Grandmother   . Hyperlipidemia Paternal Grandmother   . Heart disease Paternal Grandmother   . Alcohol abuse Paternal Grandfather    ROS: Review of Systems  Constitutional: Negative for weight loss.  Respiratory: Negative for shortness of breath.   Cardiovascular: Negative for chest pain.  Gastrointestinal: Positive for constipation. Negative for melena, nausea and vomiting.       Negative for hematochezia.  Genitourinary:       Negative for polyuria.  Musculoskeletal:       Negative for muscle weakness.  Endo/Heme/Allergies: Negative for polydipsia.       Negative for polyphagia.   PHYSICAL EXAM: Blood pressure 114/77, pulse 82, temperature 97.8 F (36.6 C), temperature source Oral, height 5\' 3"  (1.6 m), weight 175 lb (79.4 kg), last menstrual period 01/01/2014, SpO2 98 %. Body mass index is 31 kg/m. Physical Exam Vitals signs reviewed.  Constitutional:      Appearance: Normal appearance. She is obese.  Cardiovascular:     Rate and Rhythm: Normal rate.  Pulmonary:     Effort:  Pulmonary effort is normal.  Musculoskeletal: Normal range of motion.  Skin:    General: Skin is warm and dry.  Neurological:     Mental Status: She is alert and oriented to person, place, and time.  Psychiatric:        Mood and Affect: Mood normal.        Behavior: Behavior normal.    RECENT LABS AND TESTS: BMET    Component Value Date/Time   NA  145 (H) 10/14/2017 1029   K 4.2 10/14/2017 1029   CL 106 10/14/2017 1029   CO2 24 10/14/2017 1029   GLUCOSE 90 10/14/2017 1029   BUN 22 10/14/2017 1029   CREATININE 0.84 10/14/2017 1029   CALCIUM 9.4 10/14/2017 1029   GFRNONAA 77 10/14/2017 1029   GFRAA 89 10/14/2017 1029   Lab Results  Component Value Date   HGBA1C 5.5 10/14/2017   HGBA1C 5.7 (H) 03/26/2017   Lab Results  Component Value Date   INSULIN 6.8 10/14/2017   INSULIN 7.1 03/26/2017   CBC    Component Value Date/Time   WBC 7.3 03/26/2017 1157   RBC 4.88 03/26/2017 1157   HGB 14.6 03/26/2017 1157   HCT 44.6 03/26/2017 1157   MCV 91 03/26/2017 1157   MCH 29.9 03/26/2017 1157   MCHC 32.7 03/26/2017 1157   RDW 13.2 03/26/2017 1157   LYMPHSABS 2.7 03/26/2017 1157   EOSABS 0.2 03/26/2017 1157   BASOSABS 0.0 03/26/2017 1157   Iron/TIBC/Ferritin/ %Sat No results found for: IRON, TIBC, FERRITIN, IRONPCTSAT Lipid Panel     Component Value Date/Time   CHOL 195 10/14/2017 1029   TRIG 98 10/14/2017 1029   HDL 63 10/14/2017 1029   CHOLHDL 3.1 10/14/2017 1029   LDLCALC 112 (H) 10/14/2017 1029   Hepatic Function Panel     Component Value Date/Time   PROT 6.3 10/14/2017 1029   ALBUMIN 4.5 10/14/2017 1029   AST 16 10/14/2017 1029   ALT 17 10/14/2017 1029   ALKPHOS 77 10/14/2017 1029   BILITOT 0.3 10/14/2017 1029      Component Value Date/Time   TSH 2.920 03/26/2017 1157   Results for Dina, Deidrea (MRN [id number]) as of 01/22/2018 10:59  Ref. Range 10/14/2017 10:29  Vitamin D, 25-Hydroxy Latest Ref Range: 30.0 - 100.0 ng/mL 51.9    OBESITY BEHAVIORAL  INTERVENTION VISIT  Today's visit was # 16   Starting weight: 174 lbs Starting date: 03/26/17 Today's weight : Weight: 175 lb (79.4 kg)  Today's date: 01/22/2018 Total lbs lost to date: 0  ASK: We discussed the diagnosis of obesity with Barbee Fessenden today and Kynadie agreed to give [place] permission to discuss obesity behavioral modification therapy today.  ASSESS: Jasline has the diagnosis of obesity and her BMI today is 31.0. Alasha is in the action stage of change.   ADVISE: Shirell was educated on the multiple health risks of obesity as well as the benefit of weight loss to improve her health. She was advised of the need for long term treatment and the importance of lifestyle modifications to improve her current health and to decrease her risk of future health problems.  AGREE: Multiple dietary modification options and treatment options were discussed and Remonia agreed to follow the recommendations documented in the above note.  ARRANGE: Audreyana was educated on the importance of frequent visits to treat obesity as outlined per CMS and USPSTF guidelines and agreed to schedule her next follow up appointment today.  I, Marcille Blanco, am acting as Location manager for [employer], FNP-C.  I have reviewed the above documentation for accuracy and completeness, and I agree with the above.  - Rito Lecomte, FNP-C.

## 2018-01-23 ENCOUNTER — Other Ambulatory Visit (INDEPENDENT_AMBULATORY_CARE_PROVIDER_SITE_OTHER): Payer: Self-pay | Admitting: Physician Assistant

## 2018-01-23 DIAGNOSIS — E669 Obesity, unspecified: Secondary | ICD-10-CM

## 2018-01-23 DIAGNOSIS — Z6831 Body mass index (BMI) 31.0-31.9, adult: Principal | ICD-10-CM

## 2018-01-23 LAB — COMPREHENSIVE METABOLIC PANEL
ALT: 24 IU/L (ref 0–32)
AST: 15 IU/L (ref 0–40)
Albumin/Globulin Ratio: 2 (ref 1.2–2.2)
Albumin: 4.7 g/dL (ref 3.8–4.9)
Alkaline Phosphatase: 76 IU/L (ref 39–117)
BUN/Creatinine Ratio: 29 — ABNORMAL HIGH (ref 9–23)
BUN: 26 mg/dL — ABNORMAL HIGH (ref 6–24)
Bilirubin Total: 0.3 mg/dL (ref 0.0–1.2)
CO2: 22 mmol/L (ref 20–29)
Calcium: 9.4 mg/dL (ref 8.7–10.2)
Chloride: 103 mmol/L (ref 96–106)
Creatinine, Ser: 0.89 mg/dL (ref 0.57–1.00)
GFR calc Af Amer: 82 mL/min/{1.73_m2} (ref >59)
GFR calc non Af Amer: 71 mL/min/{1.73_m2} (ref >59)
Globulin, Total: 2.3 g/dL (ref 1.5–4.5)
Glucose: 93 mg/dL (ref 65–99)
Potassium: 4.7 mmol/L (ref 3.5–5.2)
Sodium: 144 mmol/L (ref 134–144)
Total Protein: 7 g/dL (ref 6.0–8.5)

## 2018-01-23 LAB — LIPID PANEL WITH LDL/HDL RATIO
Cholesterol, Total: 197 mg/dL (ref 100–199)
HDL: 62 mg/dL (ref >39)
LDL Calculated: 113 mg/dL — ABNORMAL HIGH (ref 0–99)
LDl/HDL Ratio: 1.8 ratio (ref 0.0–3.2)
Triglycerides: 108 mg/dL (ref 0–149)
VLDL Cholesterol Cal: 22 mg/dL (ref 5–40)

## 2018-01-23 LAB — HEMOGLOBIN A1C
Est. average glucose Bld gHb Est-mCnc: 111 mg/dL
Hgb A1c MFr Bld: 5.5 % (ref 4.8–5.6)

## 2018-01-23 LAB — VITAMIN D 25 HYDROXY (VIT D DEFICIENCY, FRACTURES): Vit D, 25-Hydroxy: 41.8 ng/mL (ref 30.0–100.0)

## 2018-01-23 LAB — INSULIN, RANDOM: INSULIN: 15 u[IU]/mL (ref 2.6–24.9)

## 2018-01-23 MED FILL — VIT D2 1.25 MG (50,000 UNIT: 1.25 MG | 28 days supply | Qty: 2 | Fill #0

## 2018-01-24 ENCOUNTER — Encounter: Payer: Self-pay | Admitting: Obstetrics & Gynecology

## 2018-01-24 ENCOUNTER — Ambulatory Visit (INDEPENDENT_AMBULATORY_CARE_PROVIDER_SITE_OTHER): Payer: No Typology Code available for payment source | Admitting: Obstetrics & Gynecology

## 2018-01-24 VITALS — BP 112/76 | HR 88 | Resp 16 | Ht 63.0 in | Wt 182.0 lb

## 2018-01-24 DIAGNOSIS — N952 Postmenopausal atrophic vaginitis: Secondary | ICD-10-CM | POA: Diagnosis not present

## 2018-01-24 DIAGNOSIS — N941 Unspecified dyspareunia: Secondary | ICD-10-CM | POA: Diagnosis not present

## 2018-01-24 MED ORDER — LIDOCAINE 5 % EX OINT: TOPICAL_OINTMENT | CUTANEOUS | 0 refills | Status: DC

## 2018-01-24 NOTE — Progress Notes (Signed)
GYNECOLOGY  VISIT  CC:   Follow up premarin vag cream   HPI: 58 y.o. G10P0020 Married White or Caucasian female here for atrophic vaginal changes.  Using the Premarin cream, not regularly, but still has noted improvement.  Did not use while traveling to [place] or when she had a URI.  Does have some pain with insertion.  Would like this to improve as well.  Denies vaginal bleeding.    GYNECOLOGIC HISTORY: Patient's last menstrual period was 01/01/2014 (approximate). Contraception: PMP Menopausal hormone therapy: none  Patient Active Problem List   Diagnosis Date Noted  . Vitamin D deficiency 06/05/2017  . Prediabetes 06/05/2017  . GERD (gastroesophageal reflux disease) 08/13/2016  . Obesity (BMI 30.0-34.9) 08/13/2016  . Foot mass 08/13/2016    Past Medical History:  Diagnosis Date  . Abnormal Pap smear of cervix    age 62  . Abnormal uterine bleeding   . Allergy   . Chronic constipation   . Depression   . Endometriosis   . GERD (gastroesophageal reflux disease)   . Hemorrhoids   . Hyperlipidemia   . Obesity   . Osteopenia   . Pre-diabetes     Past Surgical History:  Procedure Laterality Date  . ABDOMINOPLASTY  2006   Dr. Lesli Albee  . BREAST REDUCTION SURGERY  2006  . Regino Ramirez OF UTERUS  2016  . FRACTURE SURGERY  1972   Arm   . GYNECOLOGIC CRYOSURGERY     at age 62  . HYSTEROSCOPY  2006  . REDUCTION MAMMAPLASTY      MEDS:   Current Outpatient Medications on File Prior to Visit  Medication Sig Dispense Refill  . buPROPion (WELLBUTRIN SR) 150 MG 12 hr tablet Take 1 tablet (150 mg total) by mouth daily. 30 tablet 0  . Calcium Carbonate-Vitamin D3 (CALCIUM 600-D) 600-400 MG-UNIT TABS Takes 2 tablets daily.    [name] conjugated estrogens (PREMARIN) vaginal cream 1/2 gram vaginally twice weekly 30 g 2  . Liraglutide -Weight Management (SAXENDA) 18 MG/3ML SOPN Inject 3 mg into the skin daily. 5 pen 0  . loratadine (CLARITIN) 10 MG tablet Take 10 mg by mouth  daily.    . metFORMIN (GLUCOPHAGE) 500 MG tablet Take 1 tablet (500 mg total) by mouth daily with breakfast. 30 tablet 0  . Multiple Vitamin (MULTIVITAMIN) tablet Take 1 tablet by mouth daily.    . Omega-3 Fatty Acids (FISH OIL) 1000 MG CAPS 2 capsules daily.    [name] omeprazole (PRILOSEC) 10 MG capsule Take 20 mg by mouth daily.     . polyethylene glycol powder (GLYCOLAX/MIRALAX) powder Take 1 Container by mouth daily.    . Vitamin D, Ergocalciferol, (DRISDOL) 1.25 MG (50000 UT) CAPS capsule Take 1 capsule (50,000 Units total) by mouth every 14 (fourteen) days. 2 capsule 0  . Wheat Dextrin (BENEFIBER PO) Take by mouth daily.    . diazepam (VALIUM) 10 MG tablet once. With dental appt  0  . Insulin Pen Needle (BD PEN NEEDLE NANO U/F) 32G X 4 MM MISC 1 each by Does not apply route daily. (Patient not taking: Reported on 01/24/2018) 50 each 0  . UNIFINE PENTIPS 31G X 6 MM MISC USE TO INJECT SAXENDA DAILY (Patient not taking: Reported on 01/24/2018) 100 each 0   No current facility-administered medications on file prior to visit.     ALLERGIES: Sulfa antibiotics and Pollen extract  Family History  Problem Relation Age of Onset  . Hyperlipidemia Mother   . Stroke  Mother   . Diabetes Mother   . Hypertension Mother   . Kidney disease Mother   . Thyroid disease Mother   . Eating disorder Mother   . Obesity Mother   . Arthritis Father   . Heart disease Father   . Sudden death Father   . Hyperlipidemia Maternal Grandmother   . Heart disease Maternal Grandmother   . Mental illness Maternal Grandmother   . Alcohol abuse Maternal Grandfather   . Arthritis Paternal Grandmother   . Hyperlipidemia Paternal Grandmother   . Heart disease Paternal Grandmother   . Alcohol abuse Paternal Grandfather     SH:  Married, non smoker  Review of Systems  All other systems reviewed and are negative.   PHYSICAL EXAMINATION:    BP 112/76 (BP Location: Left Arm, Patient Position: Sitting, Cuff Size:  Normal)   Pulse 88   Resp 16   Ht 5\' 3"  (1.6 m)   Wt 182 lb (82.6 kg)   LMP 01/01/2014 (Approximate)   BMI 32.24 kg/m     General appearance: alert, cooperative and appears stated age Lymph:  no inguinal LAD noted  Pelvic: External genitalia:  no lesions              Urethra:  normal appearing urethra with no masses, tenderness or lesions              Bartholins and Skenes: normal                 Vagina: normal appearing vagina with improved pink color except at 6 o'clock area on introitus which is still very red, no discharge, no lesions               Assessment: Vaginal atrophy and dyspareunia, improved  Plan: Continue 1/2 grm premarin vaginal cream 1-2 times weekly Recommended trial of topical Lidocaine 5%, small amount on introitus right before intercourse.  Risks of cardiac issues with large application discussed.  rx to pharmacy.   ~15 minutes spent with patient >50% of time was in face to face discussion of above.

## 2018-02-11 ENCOUNTER — Telehealth: Payer: Self-pay | Admitting: Obstetrics & Gynecology

## 2018-02-11 ENCOUNTER — Encounter: Payer: Self-pay | Admitting: Obstetrics & Gynecology

## 2018-02-11 DIAGNOSIS — Z1211 Encounter for screening for malignant neoplasm of colon: Secondary | ICD-10-CM

## 2018-02-11 NOTE — Telephone Encounter (Signed)
Patient sent the following message through [facility]. Routing to triage to assist patient with request.   Dr. Sabra Heck,  I was remiss and did not schedule my colonoscopy right away. They apparently closed out the referral that was made back in October. Can you please make another referral so that I can schedule my colonoscopy. They will not allow me to schedule without another referral.     Thanks,  Tina Chavez

## 2018-02-11 NOTE — Telephone Encounter (Signed)
Last AEX 10/24/17  New referral placed to Dr. Carlean Purl at [facility].   MyChart message to patient.    Routing to provider for final review. Patient is agreeable to disposition. Will close encounter.  Cc: Magdalene Patricia

## 2018-02-12 ENCOUNTER — Encounter (INDEPENDENT_AMBULATORY_CARE_PROVIDER_SITE_OTHER): Payer: Self-pay | Admitting: Physician Assistant

## 2018-02-12 ENCOUNTER — Ambulatory Visit (INDEPENDENT_AMBULATORY_CARE_PROVIDER_SITE_OTHER): Payer: No Typology Code available for payment source | Admitting: Physician Assistant

## 2018-02-12 ENCOUNTER — Encounter: Payer: Self-pay | Admitting: Gastroenterology

## 2018-02-12 VITALS — BP 117/78 | HR 80 | Temp 98.5°F | Ht 63.0 in | Wt 175.0 lb

## 2018-02-12 DIAGNOSIS — Z6831 Body mass index (BMI) 31.0-31.9, adult: Secondary | ICD-10-CM

## 2018-02-12 DIAGNOSIS — F3289 Other specified depressive episodes: Secondary | ICD-10-CM | POA: Diagnosis not present

## 2018-02-12 DIAGNOSIS — E559 Vitamin D deficiency, unspecified: Secondary | ICD-10-CM | POA: Diagnosis not present

## 2018-02-12 DIAGNOSIS — E669 Obesity, unspecified: Secondary | ICD-10-CM

## 2018-02-12 DIAGNOSIS — Z9189 Other specified personal risk factors, not elsewhere classified: Secondary | ICD-10-CM

## 2018-02-12 MED ORDER — BUPROPION HCL ER (SR) 150 MG PO TB12: 150.0000 mg | ORAL_TABLET | Freq: Every day | ORAL | 0 refills | Status: DC

## 2018-02-12 MED FILL — BUPROPION HCL SR 150 MG TAB: 150 | 30 days supply | Qty: 30 | Fill #0

## 2018-02-12 NOTE — Progress Notes (Signed)
Office: [phone]  /  Fax: [phone]   HPI:   Chief Complaint: OBESITY Inayah is here to discuss her progress with her obesity treatment plan. She is on the Category 2 plan and is following her eating plan approximately 90% of the time. She states she is exercising 0 minutes 0 times per week. Dareth reports that she is not eating her bread at lunch. She states that she is having trouble with constipation. Her weight is 175 lb (79.4 kg) today and has not lost weight since her last visit. She has lost 0 lbs since starting treatment with [place].  Vitamin D deficiency Zahrah has a diagnosis of Vitamin D deficiency. She is currently taking Vit D and denies nausea, vomiting or muscle weakness.  Depression with emotional eating behaviors Trella is struggling with emotional eating and using food for comfort to the extent that it is negatively impacting her health. She often snacks when she is not hungry. Marolyn sometimes feels she is out of control and then feels guilty that she made poor food choices. She shows no sign of suicidal or homicidal ideations and denies cravings.  Depression screen PHQ 2/9 03/26/2017  Decreased Interest 1  Down, Depressed, Hopeless 0  PHQ - 2 Score 1  Altered sleeping 1  Tired, decreased energy 1  Change in appetite 1  Feeling bad or failure about yourself  0  Trouble concentrating 0  Moving slowly or fidgety/restless 0  Suicidal thoughts 0  PHQ-9 Score 4  Difficult doing work/chores Not difficult at all   At risk for osteopenia and osteoporosis Maizy is at higher risk of osteopenia and osteoporosis due to Vitamin D deficiency.   ASSESSMENT AND PLAN:  Vitamin D deficiency  Other depression - with emotional eating - Plan: buPROPion (WELLBUTRIN SR) 150 MG 12 hr tablet  At risk for osteoporosis  Class 1 obesity with serious comorbidity and body mass index (BMI) of 31.0 to 31.9 in adult, unspecified obesity type  PLAN:  Vitamin D Deficiency Thetis was  informed that low Vitamin D levels contributes to fatigue and are associated with obesity, breast, and colon cancer. She agrees to continue to take prescription Vit D @ 50,000 IU every week and will add over-the-counter Vitamin D 2,000 to her regimen. She will follow-up for routine testing of Vitamin D, at least 2-3 times per year. She was informed of the risk of over-replacement of Vitamin D and agrees to not increase her dose unless she discusses this with [place] first. Ashaki agrees to follow-up with our clinic in 3 weeks.  Depression with Emotional Eating Behaviors We discussed behavior modification techniques today to help Jalonda deal with her emotional eating and depression. She was given a refill on her Wellbutrin SR 150 mg qd #30 with no refills and agrees to follow-up with our clinic in 3 weeks.   At risk for osteopenia and osteoporosis Lailah was given extended  (15 minutes) osteoporosis prevention counseling today. Jenelle is at risk for osteopenia and osteoporsis due to her Vitamin D deficiency. She was encouraged to take her Vitamin D and follow her higher calcium diet and increase strengthening exercise to help strengthen her bones and decrease her risk of osteopenia and osteoporosis.  Obesity Jen is currently in the action stage of change. As such, her goal is to continue with weight loss efforts. She has agreed to follow the Category 2 plan. Lucas has been instructed to work up to a goal of 150 minutes of combined cardio and strengthening  exercise per week for weight loss and overall health benefits. We discussed the following Behavioral Modification Strategies today: increasing lean protein intake and work on meal planning and easy cooking plans.  Jerra has agreed to follow up with our clinic in 3 weeks. She was informed of the importance of frequent follow up visits to maximize her success with intensive lifestyle modifications for her multiple health conditions.  ALLERGIES: Allergies    Allergen Reactions  . Sulfa Antibiotics Anaphylaxis  . Pollen Extract     MEDICATIONS: Current Outpatient Medications on File Prior to Visit  Medication Sig Dispense Refill  . Calcium Carbonate-Vitamin D3 (CALCIUM 600-D) 600-400 MG-UNIT TABS Takes 2 tablets daily.    [name] conjugated estrogens (PREMARIN) vaginal cream 1/2 gram vaginally twice weekly 30 g 2  . diazepam (VALIUM) 10 MG tablet once. With dental appt  0  . Insulin Pen Needle (BD PEN NEEDLE NANO U/F) 32G X 4 MM MISC 1 each by Does not apply route daily. 50 each 0  . lidocaine (XYLOCAINE) 5 % ointment Apply a small pea-sized amount topically as directed no more than once daily 30 g 0  . Liraglutide -Weight Management (SAXENDA) 18 MG/3ML SOPN Inject 3 mg into the skin daily. 5 pen 0  . loratadine (CLARITIN) 10 MG tablet Take 10 mg by mouth daily.    . metFORMIN (GLUCOPHAGE) 500 MG tablet Take 1 tablet (500 mg total) by mouth daily with breakfast. 30 tablet 0  . Multiple Vitamin (MULTIVITAMIN) tablet Take 1 tablet by mouth daily.    . Omega-3 Fatty Acids (FISH OIL) 1000 MG CAPS 2 capsules daily.    [name] omeprazole (PRILOSEC) 10 MG capsule Take 20 mg by mouth daily.     . polyethylene glycol powder (GLYCOLAX/MIRALAX) powder Take 1 Container by mouth daily.    [name] UNIFINE PENTIPS 31G X 6 MM MISC USE TO INJECT SAXENDA DAILY 100 each 0  . Vitamin D, Ergocalciferol, (DRISDOL) 1.25 MG (50000 UT) CAPS capsule Take 1 capsule (50,000 Units total) by mouth every 14 (fourteen) days. 2 capsule 0  . Wheat Dextrin (BENEFIBER PO) Take by mouth daily.     No current facility-administered medications on file prior to visit.     PAST MEDICAL HISTORY: Past Medical History:  Diagnosis Date  . Abnormal Pap smear of cervix    age 17  . Abnormal uterine bleeding   . Allergy   . Chronic constipation   . Depression   . Endometriosis   . GERD (gastroesophageal reflux disease)   . Hemorrhoids   . Hyperlipidemia   . Obesity   . Osteopenia   .  Pre-diabetes     PAST SURGICAL HISTORY: Past Surgical History:  Procedure Laterality Date  . ABDOMINOPLASTY  2006   Dr. Lesli Albee  . BREAST REDUCTION SURGERY  2006  . [place] OF UTERUS  2016  . FRACTURE SURGERY  1972   Arm   . GYNECOLOGIC CRYOSURGERY     at age 87  . HYSTEROSCOPY  2006  . REDUCTION MAMMAPLASTY      SOCIAL HISTORY: Social History   Tobacco Use  . Smoking status: Never Smoker  . Smokeless tobacco: Never Used  Substance Use Topics  . Alcohol use: No  . Drug use: No    FAMILY HISTORY: Family History  Problem Relation Age of Onset  . Hyperlipidemia Mother   . Stroke Mother   . Diabetes Mother   . Hypertension Mother   . Kidney disease Mother   .  Thyroid disease Mother   . Eating disorder Mother   . Obesity Mother   . Arthritis Father   . Heart disease Father   . Sudden death Father   . Hyperlipidemia Maternal Grandmother   . Heart disease Maternal Grandmother   . Mental illness Maternal Grandmother   . Alcohol abuse Maternal Grandfather   . Arthritis Paternal Grandmother   . Hyperlipidemia Paternal Grandmother   . Heart disease Paternal Grandmother   . Alcohol abuse Paternal Grandfather    ROS: Review of Systems  Constitutional: Negative for weight loss.  Gastrointestinal: Negative for nausea and vomiting.  Musculoskeletal:       Negative for muscle weakness.  Endo/Heme/Allergies:       Negative for hypoglycemia.  Psychiatric/Behavioral: Negative for suicidal ideas.       Negative for homicidal ideas.   PHYSICAL EXAM: Blood pressure 117/78, pulse 80, temperature 98.5 F (36.9 C), temperature source Oral, height 5\' 3"  (1.6 m), weight 175 lb (79.4 kg), last menstrual period 01/01/2014, SpO2 97 %. Body mass index is 31 kg/m. Physical Exam Vitals signs reviewed.  Constitutional:      Appearance: Normal appearance. She is obese.  Cardiovascular:     Rate and Rhythm: Normal rate.     Pulses: Normal pulses.  Pulmonary:      Effort: Pulmonary effort is normal.     Breath sounds: Normal breath sounds.  Musculoskeletal: Normal range of motion.  Skin:    General: Skin is warm and dry.  Neurological:     Mental Status: She is alert and oriented to person, place, and time.  Psychiatric:        Behavior: Behavior normal.   RECENT LABS AND TESTS: BMET    Component Value Date/Time   NA 144 01/22/2018 0917   K 4.7 01/22/2018 0917   CL 103 01/22/2018 0917   CO2 22 01/22/2018 0917   GLUCOSE 93 01/22/2018 0917   BUN 26 (H) 01/22/2018 0917   CREATININE 0.89 01/22/2018 0917   CALCIUM 9.4 01/22/2018 0917   GFRNONAA 71 01/22/2018 0917   GFRAA 82 01/22/2018 0917   Lab Results  Component Value Date   HGBA1C 5.5 01/22/2018   HGBA1C 5.5 10/14/2017   HGBA1C 5.7 (H) 03/26/2017   Lab Results  Component Value Date   INSULIN 15.0 01/22/2018   INSULIN 6.8 10/14/2017   INSULIN 7.1 03/26/2017   CBC    Component Value Date/Time   WBC 7.3 03/26/2017 1157   RBC 4.88 03/26/2017 1157   HGB 14.6 03/26/2017 1157   HCT 44.6 03/26/2017 1157   MCV 91 03/26/2017 1157   MCH 29.9 03/26/2017 1157   MCHC 32.7 03/26/2017 1157   RDW 13.2 03/26/2017 1157   LYMPHSABS 2.7 03/26/2017 1157   EOSABS 0.2 03/26/2017 1157   BASOSABS 0.0 03/26/2017 1157   Iron/TIBC/Ferritin/ %Sat No results found for: IRON, TIBC, FERRITIN, IRONPCTSAT Lipid Panel     Component Value Date/Time   CHOL 197 01/22/2018 0917   TRIG 108 01/22/2018 0917   HDL 62 01/22/2018 0917   CHOLHDL 3.1 10/14/2017 1029   LDLCALC 113 (H) 01/22/2018 0917   Hepatic Function Panel     Component Value Date/Time   PROT 7.0 01/22/2018 0917   ALBUMIN 4.7 01/22/2018 0917   AST 15 01/22/2018 0917   ALT 24 01/22/2018 0917   ALKPHOS 76 01/22/2018 0917   BILITOT 0.3 01/22/2018 0917      Component Value Date/Time   TSH 2.920 03/26/2017 1157   Results  for NAJMAH, CARRADINE (MRN [id number]) as of 02/12/2018 09:02  Ref. Range 01/22/2018 09:17  Vitamin D, 25-Hydroxy Latest  Ref Range: 30.0 - 100.0 ng/mL 41.8   OBESITY BEHAVIORAL INTERVENTION VISIT  Today's visit was #17  Starting weight: 174 lbs Starting date: 03/26/2017 Today's weight: 175 lbs Today's date: 02/12/2018 Total lbs lost to date: 0  ASK: We discussed the diagnosis of obesity with Lagretta Cheyney today and Roberta agreed to give [place] permission to discuss obesity behavioral modification therapy today.  ASSESS: Enisa has the diagnosis of obesity and her BMI today is @ 31.00. Maylie is in the action stage of change.   ADVISE: Alexius was educated on the multiple health risks of obesity as well as the benefit of weight loss to improve her health. She was advised of the need for long term treatment and the importance of lifestyle modifications to improve her current health and to decrease her risk of future health problems.  AGREE: Multiple dietary modification options and treatment options were discussed and  Luiza agreed to follow the recommendations documented in the above note.  ARRANGE: Erynne was educated on the importance of frequent visits to treat obesity as outlined per CMS and USPSTF guidelines and agreed to schedule her next follow up appointment today.  Migdalia Dk, am acting as transcriptionist for Abby Potash, PA-C I, Abby Potash, PA-C have reviewed above note and agree with its content

## 2018-02-13 ENCOUNTER — Other Ambulatory Visit (INDEPENDENT_AMBULATORY_CARE_PROVIDER_SITE_OTHER): Payer: Self-pay | Admitting: Physician Assistant

## 2018-02-13 DIAGNOSIS — F3289 Other specified depressive episodes: Secondary | ICD-10-CM

## 2018-02-14 MED FILL — VIT D2 1.25 MG (50,000 UNIT: 1.25 MG | 28 days supply | Qty: 4 | Fill #0

## 2018-02-20 MED FILL — metFORMIN HCL 500 MG TABS: 500 | 30 days supply | Qty: 30 | Fill #0

## 2018-03-05 ENCOUNTER — Ambulatory Visit (INDEPENDENT_AMBULATORY_CARE_PROVIDER_SITE_OTHER): Payer: No Typology Code available for payment source | Admitting: Physician Assistant

## 2018-03-05 VITALS — BP 105/74 | HR 82 | Temp 98.3°F | Ht 63.0 in | Wt 177.0 lb

## 2018-03-05 DIAGNOSIS — Z6831 Body mass index (BMI) 31.0-31.9, adult: Secondary | ICD-10-CM

## 2018-03-05 DIAGNOSIS — F3289 Other specified depressive episodes: Secondary | ICD-10-CM

## 2018-03-05 DIAGNOSIS — Z9189 Other specified personal risk factors, not elsewhere classified: Secondary | ICD-10-CM

## 2018-03-05 DIAGNOSIS — R7303 Prediabetes: Secondary | ICD-10-CM | POA: Diagnosis not present

## 2018-03-05 DIAGNOSIS — E669 Obesity, unspecified: Secondary | ICD-10-CM

## 2018-03-05 MED ORDER — BUPROPION HCL ER (SR) 150 MG PO TB12: 150.0000 mg | ORAL_TABLET | Freq: Every day | ORAL | 0 refills | Status: DC

## 2018-03-05 MED ORDER — METFORMIN HCL 500 MG PO TABS: 500.0000 mg | ORAL_TABLET | Freq: Every day | ORAL | 0 refills | Status: DC

## 2018-03-05 NOTE — Progress Notes (Signed)
Office: [phone]  /  Fax: [phone]   HPI:   Chief Complaint: OBESITY Tina Chavez is here to discuss her progress with her obesity treatment plan. She is on the Category 2 plan and is following her eating plan approximately 70% of the time. She states she is walking 30 minutes 2 times per week. [name] reports that she got off the plan during Valentine's Day and after she went to the beach. She states she is ready to get back on track. Her weight is 177 lb (80.3 kg) today and has had a weight gain of 2 lbs since her last visit. She has lost 0 lbs since starting treatment with [place].  Pre-Diabetes Tina Chavez has a diagnosis of prediabetes based on her elevated Hgb A1c and was informed this puts her at greater risk of developing diabetes. She is taking metformin currently and continues to work on diet and exercise to decrease risk of diabetes. She does report increased hunger in the evening. She denies nausea, vomiting, or diarrhea.   At risk for diabetes Tina Chavez is at higher than averagerisk for developing diabetes due to her obesity. She currently denies polyuria or polydipsia.  Depression with emotional eating behaviors Tina Chavez is struggling with emotional eating and using food for comfort to the extent that it is negatively impacting her health. She often snacks when she is not hungry. Tina Chavez sometimes feels she is out of control and then feels guilty that she made poor food choices. She does report cravings in the evening. She has been working on behavior modification techniques to help reduce her emotional eating and has been somewhat successful. She shows no sign of suicidal or homicidal ideations.  Depression screen PHQ 2/9 03/26/2017  Decreased Interest 1  Down, Depressed, Hopeless 0  PHQ - 2 Score 1  Altered sleeping 1  Tired, decreased energy 1  Change in appetite 1  Feeling bad or failure about yourself  0  Trouble concentrating 0  Moving slowly or fidgety/restless 0  Suicidal thoughts 0    PHQ-9 Score 4  Difficult doing work/chores Not difficult at all   ASSESSMENT AND PLAN:  Prediabetes - Plan: metFORMIN (GLUCOPHAGE) 500 MG tablet  Other depression - wth emotional eating - Plan: buPROPion (WELLBUTRIN SR) 150 MG 12 hr tablet  At risk for diabetes mellitus  Class 1 obesity with serious comorbidity and body mass index (BMI) of 31.0 to 31.9 in adult, unspecified obesity type  Other depression - with emotional eating - Plan: buPROPion (WELLBUTRIN SR) 150 MG 12 hr tablet  PLAN:  Pre-Diabetes Tina Chavez will continue to work on weight loss, exercise, and decreasing simple carbohydrates in her diet to help decrease the risk of diabetes. We dicussed metformin including benefits and risks. She was informed that eating too many simple carbohydrates or too many calories at one sitting increases the likelihood of GI side effects. Tina Chavez is on metformin for now and a refill prescription was written today for #30 and she was instructed to take this with dinner. Tina Chavez agreed to follow-up with [place] as directed to monitor her progress.  Diabetes risk counselling Tina Chavez was given extended (15 minutes) diabetes prevention counseling today. She is 58 y.o. female and has risk factors for diabetes including obesity. We discussed intensive lifestyle modifications today with an emphasis on weight loss as well as increasing exercise and decreasing simple carbohydrates in her diet.  Depression with Emotional Eating Behaviors We discussed behavior modification techniques today to help Tina Chavez deal with her emotional eating and depression.  She was given a refill on her bupropion #30 with 0 refills. Tina Chavez agrees to follow-up with our clinic in 3-4 weeks.  Obesity Tina Chavez is currently in the action stage of change. As such, her goal is to continue with weight loss efforts. She has agreed to follow the Category 2 plan. Tina Chavez has been instructed to work up to a goal of 150 minutes of combined cardio and  strengthening exercise per week for weight loss and overall health benefits. We discussed the following Behavioral Modification Strategies today: increasing lean protein intake and work on meal planning and easy cooking plans.  Tina Chavez has agreed to follow-up with our clinic in 3-4 weeks. She was informed of the importance of frequent follow up visits to maximize her success with intensive lifestyle modifications for her multiple health conditions.  ALLERGIES: Allergies  Allergen Reactions  . Sulfa Antibiotics Anaphylaxis  . Pollen Extract     MEDICATIONS: Current Outpatient Medications on File Prior to Visit  Medication Sig Dispense Refill  . Calcium Carbonate-Vitamin D3 (CALCIUM 600-D) 600-400 MG-UNIT TABS Takes 2 tablets daily.    [name] conjugated estrogens (PREMARIN) vaginal cream 1/2 gram vaginally twice weekly 30 g 2  . diazepam (VALIUM) 10 MG tablet once. With dental appt  0  . Insulin Pen Needle (BD PEN NEEDLE NANO U/F) 32G X 4 MM MISC 1 each by Does not apply route daily. 50 each 0  . lidocaine (XYLOCAINE) 5 % ointment Apply a small pea-sized amount topically as directed no more than once daily 30 g 0  . Liraglutide -Weight Management (SAXENDA) 18 MG/3ML SOPN Inject 3 mg into the skin daily. 5 pen 0  . loratadine (CLARITIN) 10 MG tablet Take 10 mg by mouth daily.    . Multiple Vitamin (MULTIVITAMIN) tablet Take 1 tablet by mouth daily.    . Omega-3 Fatty Acids (FISH OIL) 1000 MG CAPS 2 capsules daily.    [name] omeprazole (PRILOSEC) 10 MG capsule Take 20 mg by mouth daily.     . polyethylene glycol powder (GLYCOLAX/MIRALAX) powder Take 1 Container by mouth daily.    [name] UNIFINE PENTIPS 31G X 6 MM MISC USE TO INJECT SAXENDA DAILY 100 each 0  . Vitamin D, Ergocalciferol, (DRISDOL) 1.25 MG (50000 UT) CAPS capsule Take 1 capsule (50,000 Units total) by mouth every 14 (fourteen) days. 2 capsule 0  . Wheat Dextrin (BENEFIBER PO) Take by mouth daily.     No current facility-administered  medications on file prior to visit.     PAST MEDICAL HISTORY: Past Medical History:  Diagnosis Date  . Abnormal Pap smear of cervix    age 76  . Abnormal uterine bleeding   . Allergy   . Chronic constipation   . Depression   . Endometriosis   . GERD (gastroesophageal reflux disease)   . Hemorrhoids   . Hyperlipidemia   . Obesity   . Osteopenia   . Pre-diabetes     PAST SURGICAL HISTORY: Past Surgical History:  Procedure Laterality Date  . ABDOMINOPLASTY  2006   Dr. Lesli Albee  . BREAST REDUCTION SURGERY  2006  . [place] OF UTERUS  2016  . FRACTURE SURGERY  1972   Arm   . GYNECOLOGIC CRYOSURGERY     at age 59  . HYSTEROSCOPY  2006  . REDUCTION MAMMAPLASTY      SOCIAL HISTORY: Social History   Tobacco Use  . Smoking status: Never Smoker  . Smokeless tobacco: Never Used  Substance Use Topics  .  Alcohol use: No  . Drug use: No    FAMILY HISTORY: Family History  Problem Relation Age of Onset  . Hyperlipidemia Mother   . Stroke Mother   . Diabetes Mother   . Hypertension Mother   . Kidney disease Mother   . Thyroid disease Mother   . Eating disorder Mother   . Obesity Mother   . Arthritis Father   . Heart disease Father   . Sudden death Father   . Hyperlipidemia Maternal Grandmother   . Heart disease Maternal Grandmother   . Mental illness Maternal Grandmother   . Alcohol abuse Maternal Grandfather   . Arthritis Paternal Grandmother   . Hyperlipidemia Paternal Grandmother   . Heart disease Paternal Grandmother   . Alcohol abuse Paternal Grandfather    ROS: Review of Systems  Constitutional: Negative for weight loss.  Gastrointestinal: Negative for diarrhea, nausea and vomiting.  Psychiatric/Behavioral: Negative for suicidal ideas.       Negative for homicidal ideas.   PHYSICAL EXAM: Blood pressure 105/74, pulse 82, temperature 98.3 F (36.8 C), temperature source Oral, height 5\' 3"  (1.6 m), weight 177 lb (80.3 kg), last menstrual  period 01/01/2014, SpO2 98 %. Body mass index is 31.35 kg/m. Physical Exam Vitals signs reviewed.  Constitutional:      Appearance: Normal appearance. She is obese.  Cardiovascular:     Rate and Rhythm: Normal rate.     Pulses: Normal pulses.  Pulmonary:     Effort: Pulmonary effort is normal.     Breath sounds: Normal breath sounds.  Musculoskeletal: Normal range of motion.  Skin:    General: Skin is warm and dry.  Neurological:     Mental Status: She is alert and oriented to person, place, and time.  Psychiatric:        Behavior: Behavior normal.        Thought Content: Thought content does not include homicidal or suicidal ideation.   RECENT LABS AND TESTS: BMET    Component Value Date/Time   NA 144 01/22/2018 0917   K 4.7 01/22/2018 0917   CL 103 01/22/2018 0917   CO2 22 01/22/2018 0917   GLUCOSE 93 01/22/2018 0917   BUN 26 (H) 01/22/2018 0917   CREATININE 0.89 01/22/2018 0917   CALCIUM 9.4 01/22/2018 0917   GFRNONAA 71 01/22/2018 0917   GFRAA 82 01/22/2018 0917   Lab Results  Component Value Date   HGBA1C 5.5 01/22/2018   HGBA1C 5.5 10/14/2017   HGBA1C 5.7 (H) 03/26/2017   Lab Results  Component Value Date   INSULIN 15.0 01/22/2018   INSULIN 6.8 10/14/2017   INSULIN 7.1 03/26/2017   CBC    Component Value Date/Time   WBC 7.3 03/26/2017 1157   RBC 4.88 03/26/2017 1157   HGB 14.6 03/26/2017 1157   HCT 44.6 03/26/2017 1157   MCV 91 03/26/2017 1157   MCH 29.9 03/26/2017 1157   MCHC 32.7 03/26/2017 1157   RDW 13.2 03/26/2017 1157   LYMPHSABS 2.7 03/26/2017 1157   EOSABS 0.2 03/26/2017 1157   BASOSABS 0.0 03/26/2017 1157   Iron/TIBC/Ferritin/ %Sat No results found for: IRON, TIBC, FERRITIN, IRONPCTSAT Lipid Panel     Component Value Date/Time   CHOL 197 01/22/2018 0917   TRIG 108 01/22/2018 0917   HDL 62 01/22/2018 0917   CHOLHDL 3.1 10/14/2017 1029   LDLCALC 113 (H) 01/22/2018 0917   Hepatic Function Panel     Component Value Date/Time    PROT 7.0 01/22/2018 0917  ALBUMIN 4.7 01/22/2018 0917   AST 15 01/22/2018 0917   ALT 24 01/22/2018 0917   ALKPHOS 76 01/22/2018 0917   BILITOT 0.3 01/22/2018 0917      Component Value Date/Time   TSH 2.920 03/26/2017 1157    Ref. Range 01/22/2018 09:17  Vitamin D, 25-Hydroxy Latest Ref Range: 30.0 - 100.0 ng/mL 41.8   OBESITY BEHAVIORAL INTERVENTION VISIT  Today's visit was #18  Starting weight: 174 lbs Starting date: 03/26/2017 Today's weight: 177 lbs Today's date: 03/05/2018 Total lbs lost to date: 0    03/05/2018  Height 5\' 3"  (1.6 m)  Weight 177 lb (80.3 kg)  BMI (Calculated) 31.36  BLOOD PRESSURE - SYSTOLIC 824  BLOOD PRESSURE - DIASTOLIC 74   Body Fat % 23.5 %  Total Body Water (lbs) 67.8 lbs   ASK: We discussed the diagnosis of obesity with Tina Chavez today and Tina Chavez agreed to give [place] permission to discuss obesity behavioral modification therapy today.  ASSESS: Tina Chavez has the diagnosis of obesity and her BMI today is 31.36. Tina Chavez is in the action stage of change.   ADVISE: Tina Chavez was educated on the multiple health risks of obesity as well as the benefit of weight loss to improve her health. She was advised of the need for long term treatment and the importance of lifestyle modifications to improve her current health and to decrease her risk of future health problems.  AGREE: Multiple dietary modification options and treatment options were discussed and  Tina Chavez agreed to follow the recommendations documented in the above note.  ARRANGE: Tina Chavez was educated on the importance of frequent visits to treat obesity as outlined per CMS and USPSTF guidelines and agreed to schedule her next follow up appointment today.  Migdalia Dk, am acting as transcriptionist for Abby Potash, PA-C I, Abby Potash, PA-C have reviewed above note and agree with its content

## 2018-03-20 MED FILL — BUPROPION HCL SR 150 MG TAB: 150 | 30 days supply | Qty: 30 | Fill #0

## 2018-03-24 ENCOUNTER — Other Ambulatory Visit (INDEPENDENT_AMBULATORY_CARE_PROVIDER_SITE_OTHER): Payer: Self-pay | Admitting: Family Medicine

## 2018-03-24 DIAGNOSIS — E559 Vitamin D deficiency, unspecified: Secondary | ICD-10-CM

## 2018-03-24 MED FILL — metFORMIN HCL 500 MG TABS: 500 | 30 days supply | Qty: 30 | Fill #0

## 2018-03-25 MED FILL — VIT D2 1.25 MG (50,000 UNIT: 1.25 MG | 28 days supply | Qty: 4 | Fill #0

## 2018-03-26 ENCOUNTER — Encounter (INDEPENDENT_AMBULATORY_CARE_PROVIDER_SITE_OTHER): Payer: Self-pay

## 2018-03-27 ENCOUNTER — Encounter (INDEPENDENT_AMBULATORY_CARE_PROVIDER_SITE_OTHER): Payer: Self-pay

## 2018-03-31 ENCOUNTER — Encounter: Payer: No Typology Code available for payment source | Admitting: Gastroenterology

## 2018-04-01 ENCOUNTER — Other Ambulatory Visit: Payer: Self-pay

## 2018-04-01 ENCOUNTER — Ambulatory Visit (INDEPENDENT_AMBULATORY_CARE_PROVIDER_SITE_OTHER): Payer: No Typology Code available for payment source | Admitting: Family Medicine

## 2018-04-01 ENCOUNTER — Encounter (INDEPENDENT_AMBULATORY_CARE_PROVIDER_SITE_OTHER): Payer: Self-pay | Admitting: Family Medicine

## 2018-04-01 DIAGNOSIS — R7303 Prediabetes: Secondary | ICD-10-CM

## 2018-04-01 DIAGNOSIS — E669 Obesity, unspecified: Secondary | ICD-10-CM | POA: Diagnosis not present

## 2018-04-01 DIAGNOSIS — E7849 Other hyperlipidemia: Secondary | ICD-10-CM | POA: Diagnosis not present

## 2018-04-01 DIAGNOSIS — Z6831 Body mass index (BMI) 31.0-31.9, adult: Secondary | ICD-10-CM | POA: Diagnosis not present

## 2018-04-02 NOTE — Progress Notes (Signed)
Office: [phone]  /  Fax: [phone] TeleHealth Visit:  Tina Chavez has verbally consented to this TeleHealth visit today. The patient is located at home, the provider is located at the [facility] office. The participants in this visit include the listed provider and patient and provider's assistant. The visit was conducted today via telephone call on Facetime.  HPI:   Chief Complaint: OBESITY Tina Chavez is here to discuss her progress with her obesity treatment plan. She is on the Category 2 plan and is following her eating plan approximately 75 % of the time. She states she is walking more than usual. Tina Chavez has wanted to eat more, likely emotional in eating more secondary to stress.  We were unable to weigh the patient today for this TeleHealth visit. She feels as if she has maintained her weight since her last visit. She has lost 0 lbs since starting treatment with [place].  Hyperlipidemia Tina Chavez has hyperlipidemia and has been trying to improve her cholesterol levels with intensive lifestyle modification including a low saturated fat diet, exercise and weight loss. Her LDL is of 113 and she is not on statin. She denies any chest pain, claudication or myalgias.  Pre-Diabetes Tina Chavez has a diagnosis of pre-diabetes based on her last elevated Hgb A1c 5.5 and insulin of 15.0. She was informed this puts her at greater risk of developing diabetes. She is taking metformin currently and notes cravings. She continues to work on diet and exercise to decrease risk of diabetes. She denies nausea or hypoglycemia.  ASSESSMENT AND PLAN:  Other hyperlipidemia  Prediabetes  Class 1 obesity with serious comorbidity and body mass index (BMI) of 31.0 to 31.9 in adult, unspecified obesity type  PLAN:  Hyperlipidemia Tina Chavez was informed of the American Heart Association Guidelines emphasizing intensive lifestyle modifications as the first line treatment for hyperlipidemia. We discussed many  lifestyle modifications today in depth, and Becki will continue to work on decreasing saturated fats such as fatty red meat, butter and many fried foods. She will also increase vegetables and lean protein in her diet and continue to work on exercise and weight loss efforts. We will repeat FLP in early May. [name] agrees to follow up with our clinic in 2 weeks.  Pre-Diabetes Tina Chavez will continue to work on weight loss, exercise, and decreasing simple carbohydrates in her diet to help decrease the risk of diabetes. We dicussed metformin including benefits and risks. She was informed that eating too many simple carbohydrates or too many calories at one sitting increases the likelihood of GI side effects. Tina Chavez agrees to continue taking metformin daily, and she agrees to follow up with our clinic in 2 weeks as directed to monitor her progress.  Obesity Tina Chavez is currently in the action stage of change. As such, her goal is to continue with weight loss efforts She has agreed to follow the Category 2 plan Tina Chavez has been instructed to work up to a goal of 150 minutes of combined cardio and strengthening exercise per week for weight loss and overall health benefits. We discussed the following Behavioral Modification Strategies today: increasing lean protein intake, increasing vegetables and work on meal planning and easy cooking plans, and planning for success   Tina Chavez has agreed to follow up with our clinic in 2 weeks. She was informed of the importance of frequent follow up visits to maximize her success with intensive lifestyle modifications for her multiple health conditions.  ALLERGIES: Allergies  Allergen Reactions  . Sulfa Antibiotics Anaphylaxis  .  Pollen Extract     MEDICATIONS: Current Outpatient Medications on File Prior to Visit  Medication Sig Dispense Refill  . buPROPion (WELLBUTRIN SR) 150 MG 12 hr tablet Take 1 tablet (150 mg total) by mouth daily. 30 tablet 0  . Calcium  Carbonate-Vitamin D3 (CALCIUM 600-D) 600-400 MG-UNIT TABS Takes 2 tablets daily.    [name] conjugated estrogens (PREMARIN) vaginal cream 1/2 gram vaginally twice weekly 30 g 2  . diazepam (VALIUM) 10 MG tablet once. With dental appt  0  . Insulin Pen Needle (BD PEN NEEDLE NANO U/F) 32G X 4 MM MISC 1 each by Does not apply route daily. 50 each 0  . lidocaine (XYLOCAINE) 5 % ointment Apply a small pea-sized amount topically as directed no more than once daily 30 g 0  . Liraglutide -Weight Management (SAXENDA) 18 MG/3ML SOPN Inject 3 mg into the skin daily. 5 pen 0  . loratadine (CLARITIN) 10 MG tablet Take 10 mg by mouth daily.    . metFORMIN (GLUCOPHAGE) 500 MG tablet Take 1 tablet (500 mg total) by mouth daily with breakfast. 30 tablet 0  . Multiple Vitamin (MULTIVITAMIN) tablet Take 1 tablet by mouth daily.    . Omega-3 Fatty Acids (FISH OIL) 1000 MG CAPS 2 capsules daily.    [name] omeprazole (PRILOSEC) 10 MG capsule Take 20 mg by mouth daily.     . polyethylene glycol powder (GLYCOLAX/MIRALAX) powder Take 1 Container by mouth daily.    [name] UNIFINE PENTIPS 31G X 6 MM MISC USE TO INJECT SAXENDA DAILY 100 each 0  . Vitamin D, Ergocalciferol, (DRISDOL) 1.25 MG (50000 UT) CAPS capsule TAKE 1 CAPSULE BY MOUTH EVERY 7 DAYS. 4 capsule 0  . Wheat Dextrin (BENEFIBER PO) Take by mouth daily.     No current facility-administered medications on file prior to visit.     PAST MEDICAL HISTORY: Past Medical History:  Diagnosis Date  . Abnormal Pap smear of cervix    age 64  . Abnormal uterine bleeding   . Allergy   . Chronic constipation   . Depression   . Endometriosis   . GERD (gastroesophageal reflux disease)   . Hemorrhoids   . Hyperlipidemia   . Obesity   . Osteopenia   . Pre-diabetes     PAST SURGICAL HISTORY: Past Surgical History:  Procedure Laterality Date  . ABDOMINOPLASTY  2006   Dr. Lesli Albee  . BREAST REDUCTION SURGERY  2006  . [place] OF UTERUS  2016  . FRACTURE SURGERY   1972   Arm   . GYNECOLOGIC CRYOSURGERY     at age 20  . HYSTEROSCOPY  2006  . REDUCTION MAMMAPLASTY      SOCIAL HISTORY: Social History   Tobacco Use  . Smoking status: Never Smoker  . Smokeless tobacco: Never Used  Substance Use Topics  . Alcohol use: No  . Drug use: No    FAMILY HISTORY: Family History  Problem Relation Age of Onset  . Hyperlipidemia Mother   . Stroke Mother   . Diabetes Mother   . Hypertension Mother   . Kidney disease Mother   . Thyroid disease Mother   . Eating disorder Mother   . Obesity Mother   . Arthritis Father   . Heart disease Father   . Sudden death Father   . Hyperlipidemia Maternal Grandmother   . Heart disease Maternal Grandmother   . Mental illness Maternal Grandmother   . Alcohol abuse Maternal Grandfather   . Arthritis  Paternal Grandmother   . Hyperlipidemia Paternal Grandmother   . Heart disease Paternal Grandmother   . Alcohol abuse Paternal Grandfather     ROS: Review of Systems  Constitutional: Negative for weight loss.  Cardiovascular: Negative for chest pain and claudication.  Gastrointestinal: Negative for nausea.  Musculoskeletal: Negative for myalgias.  Endo/Heme/Allergies:       Negative hypoglycemia    PHYSICAL EXAM: Pt in no acute distress  RECENT LABS AND TESTS: BMET    Component Value Date/Time   NA 144 01/22/2018 0917   K 4.7 01/22/2018 0917   CL 103 01/22/2018 0917   CO2 22 01/22/2018 0917   GLUCOSE 93 01/22/2018 0917   BUN 26 (H) 01/22/2018 0917   CREATININE 0.89 01/22/2018 0917   CALCIUM 9.4 01/22/2018 0917   GFRNONAA 71 01/22/2018 0917   GFRAA 82 01/22/2018 0917   Lab Results  Component Value Date   HGBA1C 5.5 01/22/2018   HGBA1C 5.5 10/14/2017   HGBA1C 5.7 (H) 03/26/2017   Lab Results  Component Value Date   INSULIN 15.0 01/22/2018   INSULIN 6.8 10/14/2017   INSULIN 7.1 03/26/2017   CBC    Component Value Date/Time   WBC 7.3 03/26/2017 1157   RBC 4.88 03/26/2017 1157   HGB  14.6 03/26/2017 1157   HCT 44.6 03/26/2017 1157   MCV 91 03/26/2017 1157   MCH 29.9 03/26/2017 1157   MCHC 32.7 03/26/2017 1157   RDW 13.2 03/26/2017 1157   LYMPHSABS 2.7 03/26/2017 1157   EOSABS 0.2 03/26/2017 1157   BASOSABS 0.0 03/26/2017 1157   Iron/TIBC/Ferritin/ %Sat No results found for: IRON, TIBC, FERRITIN, IRONPCTSAT Lipid Panel     Component Value Date/Time   CHOL 197 01/22/2018 0917   TRIG 108 01/22/2018 0917   HDL 62 01/22/2018 0917   CHOLHDL 3.1 10/14/2017 1029   LDLCALC 113 (H) 01/22/2018 0917   Hepatic Function Panel     Component Value Date/Time   PROT 7.0 01/22/2018 0917   ALBUMIN 4.7 01/22/2018 0917   AST 15 01/22/2018 0917   ALT 24 01/22/2018 0917   ALKPHOS 76 01/22/2018 0917   BILITOT 0.3 01/22/2018 0917      Component Value Date/Time   TSH 2.920 03/26/2017 1157      I, Trixie Dredge, am acting as transcriptionist for Ilene Qua, MD  I have reviewed the above documentation for accuracy and completeness, and I agree with the above. - Ilene Qua, MD

## 2018-04-15 ENCOUNTER — Ambulatory Visit (INDEPENDENT_AMBULATORY_CARE_PROVIDER_SITE_OTHER): Payer: No Typology Code available for payment source | Admitting: Family Medicine

## 2018-04-15 ENCOUNTER — Encounter (INDEPENDENT_AMBULATORY_CARE_PROVIDER_SITE_OTHER): Payer: Self-pay | Admitting: Family Medicine

## 2018-04-15 ENCOUNTER — Other Ambulatory Visit: Payer: Self-pay

## 2018-04-15 ENCOUNTER — Ambulatory Visit (INDEPENDENT_AMBULATORY_CARE_PROVIDER_SITE_OTHER): Payer: No Typology Code available for payment source | Admitting: Physician Assistant

## 2018-04-15 DIAGNOSIS — Z6831 Body mass index (BMI) 31.0-31.9, adult: Secondary | ICD-10-CM | POA: Diagnosis not present

## 2018-04-15 DIAGNOSIS — E669 Obesity, unspecified: Secondary | ICD-10-CM | POA: Diagnosis not present

## 2018-04-15 DIAGNOSIS — E559 Vitamin D deficiency, unspecified: Secondary | ICD-10-CM

## 2018-04-15 DIAGNOSIS — E8881 Metabolic syndrome: Secondary | ICD-10-CM | POA: Diagnosis not present

## 2018-04-15 MED ORDER — VITAMIN D (ERGOCALCIFEROL) 1.25 MG (50000 UNIT) PO CAPS: 50000.0000 [IU] | ORAL_CAPSULE | ORAL | 0 refills | Status: DC

## 2018-04-16 MED FILL — VIT D2 1.25 MG (50,000 UNIT: 1.25 MG | 28 days supply | Qty: 4 | Fill #0

## 2018-04-21 NOTE — Progress Notes (Signed)
Office: [phone]  /  Fax: [phone] TeleHealth Visit:  Tina Chavez has verbally consented to this TeleHealth visit today. The patient is located at home, the provider is located at the [employer] [facility] office. The participants in this visit include the listed provider and patient. The visit was conducted today via face time.  HPI:   Chief Complaint: OBESITY Tina Chavez is here to discuss her progress with her obesity treatment plan. She is on the Category 2 plan and is following her eating plan approximately 35 % of the time. She states she is exercising 0 minutes 0 times per week. Tina Chavez has been emotionally eating secondary to stress. She is finding doing telehealth to be exhausting. She is often overindulging in snacks and is not following the plan for the majority of the time. She is doing well on 1.2 mg Saxenda.  We were unable to weigh the patient today for this TeleHealth visit. She feels as if she has gained weight since her last visit. She has lost 0 lbs since starting treatment with [place].  Vitamin D Deficiency Tina Chavez has a diagnosis of vitamin D deficiency. She is currently taking prescription Vit D. She notes fatigue and denies nausea, vomiting or muscle weakness.  Insulin Resistance Tina Chavez has a diagnosis of insulin resistance based on her elevated fasting insulin level >5. Last Hgb A1c of 5.5 and insulin of 15.0. Although Tina Chavez's blood glucose readings are still under good control, insulin resistance puts her at greater risk of metabolic syndrome and diabetes. She is taking metformin currently and notes carbohydrate cravings. She continues to work on diet and exercise to decrease risk of diabetes.  ASSESSMENT AND PLAN:  Insulin resistance  Vitamin D deficiency - Plan: Vitamin D, Ergocalciferol, (DRISDOL) 1.25 MG (50000 UT) CAPS capsule  Class 1 obesity with serious comorbidity and body mass index (BMI) of 31.0 to 31.9 in adult, unspecified obesity type  PLAN:   Vitamin D Deficiency Tina Chavez was informed that low vitamin D levels contributes to fatigue and are associated with obesity, breast, and colon cancer. Tina Chavez agrees to continue taking prescription Vit D @50 ,000 IU every week #4 and we will refill for 1 month. She will follow up for routine testing of vitamin D, at least 2-3 times per year. She was informed of the risk of over-replacement of vitamin D and agrees to not increase her dose unless she discusses this with [place] first. Tina Chavez agrees to follow up with ur clinic in 2 weeks.  Insulin Resistance Tina Chavez will continue to work on weight loss, exercise, and decreasing simple carbohydrates in her diet to help decrease the risk of diabetes. We dicussed metformin including benefits and risks. She was informed that eating too many simple carbohydrates or too many calories at one sitting increases the likelihood of GI side effects. [name] agrees to continue taking metformin, no refill needed at this time. Tina Chavez agrees to follow up with our clinic in 2 weeks as directed to monitor her progress.  Obesity Tina Chavez is currently in the action stage of change. As such, her goal is to continue with weight loss efforts She has agreed to follow the Category 2 plan Tina Chavez has been instructed to work up to a goal of 150 minutes of combined cardio and strengthening exercise per week for weight loss and overall health benefits. We discussed the following Behavioral Modification Strategies today: increasing lean protein intake, increasing vegetables and work on meal planning and easy cooking plans, better snacking choices, and planning for success  We discussed various medication options to help Tina Chavez with her weight loss efforts and we both agreed to increase Saxenda to 1.8 mg SubQ daily.  Tina Chavez has agreed to follow up with our clinic in 2 weeks. She was informed of the importance of frequent follow up visits to maximize her success with intensive lifestyle modifications for her  multiple health conditions.  ALLERGIES: Allergies  Allergen Reactions  . Sulfa Antibiotics Anaphylaxis  . Pollen Extract     MEDICATIONS: Current Outpatient Medications on File Prior to Visit  Medication Sig Dispense Refill  . buPROPion (WELLBUTRIN SR) 150 MG 12 hr tablet Take 1 tablet (150 mg total) by mouth daily. 30 tablet 0  . Calcium Carbonate-Vitamin D3 (CALCIUM 600-D) 600-400 MG-UNIT TABS Takes 2 tablets daily.    Tina Chavez Kitchen conjugated estrogens (PREMARIN) vaginal cream 1/2 gram vaginally twice weekly 30 g 2  . diazepam (VALIUM) 10 MG tablet once. With dental appt  0  . Insulin Pen Needle (BD PEN NEEDLE NANO U/F) 32G X 4 MM MISC 1 each by Does not apply route daily. 50 each 0  . lidocaine (XYLOCAINE) 5 % ointment Apply a small pea-sized amount topically as directed no more than once daily 30 g 0  . Liraglutide -Weight Management (SAXENDA) 18 MG/3ML SOPN Inject 3 mg into the skin daily. 5 pen 0  . loratadine (CLARITIN) 10 MG tablet Take 10 mg by mouth daily.    . metFORMIN (GLUCOPHAGE) 500 MG tablet Take 1 tablet (500 mg total) by mouth daily with breakfast. 30 tablet 0  . Multiple Vitamin (MULTIVITAMIN) tablet Take 1 tablet by mouth daily.    . Omega-3 Fatty Acids (FISH OIL) 1000 MG CAPS 2 capsules daily.    Tina Chavez Kitchen omeprazole (PRILOSEC) 10 MG capsule Take 20 mg by mouth daily.     . polyethylene glycol powder (GLYCOLAX/MIRALAX) powder Take 1 Container by mouth daily.    Tina Chavez Kitchen UNIFINE PENTIPS 31G X 6 MM MISC USE TO INJECT SAXENDA DAILY 100 each 0  . Wheat Dextrin (BENEFIBER PO) Take by mouth daily.     No current facility-administered medications on file prior to visit.     PAST MEDICAL HISTORY: Past Medical History:  Diagnosis Date  . Abnormal Pap smear of cervix    age 89  . Abnormal uterine bleeding   . Allergy   . Chronic constipation   . Depression   . Endometriosis   . GERD (gastroesophageal reflux disease)   . Hemorrhoids   . Hyperlipidemia   . Obesity   . Osteopenia   .  Pre-diabetes     PAST SURGICAL HISTORY: Past Surgical History:  Procedure Laterality Date  . ABDOMINOPLASTY  2006   Dr. Lesli Albee  . BREAST REDUCTION SURGERY  2006  . [place] OF UTERUS  2016  . FRACTURE SURGERY  1972   Arm   . GYNECOLOGIC CRYOSURGERY     at age 45  . HYSTEROSCOPY  2006  . REDUCTION MAMMAPLASTY      SOCIAL HISTORY: Social History   Tobacco Use  . Smoking status: Never Smoker  . Smokeless tobacco: Never Used  Substance Use Topics  . Alcohol use: No  . Drug use: No    FAMILY HISTORY: Family History  Problem Relation Age of Onset  . Hyperlipidemia Mother   . Stroke Mother   . Diabetes Mother   . Hypertension Mother   . Kidney disease Mother   . Thyroid disease Mother   . Eating disorder Mother   .  Obesity Mother   . Arthritis Father   . Heart disease Father   . Sudden death Father   . Hyperlipidemia Maternal Grandmother   . Heart disease Maternal Grandmother   . Mental illness Maternal Grandmother   . Alcohol abuse Maternal Grandfather   . Arthritis Paternal Grandmother   . Hyperlipidemia Paternal Grandmother   . Heart disease Paternal Grandmother   . Alcohol abuse Paternal Grandfather     ROS: Review of Systems  Constitutional: Positive for malaise/fatigue. Negative for weight loss.  Gastrointestinal: Negative for nausea and vomiting.  Musculoskeletal:       Negative muscle weakness    PHYSICAL EXAM: Pt in no acute distress  RECENT LABS AND TESTS: BMET    Component Value Date/Time   NA 144 01/22/2018 0917   K 4.7 01/22/2018 0917   CL 103 01/22/2018 0917   CO2 22 01/22/2018 0917   GLUCOSE 93 01/22/2018 0917   BUN 26 (H) 01/22/2018 0917   CREATININE 0.89 01/22/2018 0917   CALCIUM 9.4 01/22/2018 0917   GFRNONAA 71 01/22/2018 0917   GFRAA 82 01/22/2018 0917   Lab Results  Component Value Date   HGBA1C 5.5 01/22/2018   HGBA1C 5.5 10/14/2017   HGBA1C 5.7 (H) 03/26/2017   Lab Results  Component Value Date    INSULIN 15.0 01/22/2018   INSULIN 6.8 10/14/2017   INSULIN 7.1 03/26/2017   CBC    Component Value Date/Time   WBC 7.3 03/26/2017 1157   RBC 4.88 03/26/2017 1157   HGB 14.6 03/26/2017 1157   HCT 44.6 03/26/2017 1157   MCV 91 03/26/2017 1157   MCH 29.9 03/26/2017 1157   MCHC 32.7 03/26/2017 1157   RDW 13.2 03/26/2017 1157   LYMPHSABS 2.7 03/26/2017 1157   EOSABS 0.2 03/26/2017 1157   BASOSABS 0.0 03/26/2017 1157   Iron/TIBC/Ferritin/ %Sat No results found for: IRON, TIBC, FERRITIN, IRONPCTSAT Lipid Panel     Component Value Date/Time   CHOL 197 01/22/2018 0917   TRIG 108 01/22/2018 0917   HDL 62 01/22/2018 0917   CHOLHDL 3.1 10/14/2017 1029   LDLCALC 113 (H) 01/22/2018 0917   Hepatic Function Panel     Component Value Date/Time   PROT 7.0 01/22/2018 0917   ALBUMIN 4.7 01/22/2018 0917   AST 15 01/22/2018 0917   ALT 24 01/22/2018 0917   ALKPHOS 76 01/22/2018 0917   BILITOT 0.3 01/22/2018 0917      Component Value Date/Time   TSH 2.920 03/26/2017 1157      I, Trixie Dredge, am acting as transcriptionist for Ilene Qua, MD  I have reviewed the above documentation for accuracy and completeness, and I agree with the above. - Ilene Qua, MD

## 2018-04-28 ENCOUNTER — Encounter: Payer: No Typology Code available for payment source | Admitting: Gastroenterology

## 2018-04-29 ENCOUNTER — Ambulatory Visit (INDEPENDENT_AMBULATORY_CARE_PROVIDER_SITE_OTHER): Payer: No Typology Code available for payment source | Admitting: Family Medicine

## 2018-04-29 ENCOUNTER — Other Ambulatory Visit: Payer: Self-pay

## 2018-04-29 ENCOUNTER — Encounter (INDEPENDENT_AMBULATORY_CARE_PROVIDER_SITE_OTHER): Payer: Self-pay | Admitting: Family Medicine

## 2018-04-29 DIAGNOSIS — E669 Obesity, unspecified: Secondary | ICD-10-CM | POA: Diagnosis not present

## 2018-04-29 DIAGNOSIS — Z6831 Body mass index (BMI) 31.0-31.9, adult: Secondary | ICD-10-CM | POA: Diagnosis not present

## 2018-04-29 DIAGNOSIS — R7303 Prediabetes: Secondary | ICD-10-CM | POA: Diagnosis not present

## 2018-04-30 NOTE — Progress Notes (Signed)
Office: [phone]  /  Fax: [phone] TeleHealth Visit:  Tina Chavez has verbally consented to this TeleHealth visit today. The patient is located at work, the provider is located at the [employer] [facility] office. The participants in this visit include the listed provider and patient and any and all parties involved. The visit was conducted today via FaceTime.  HPI:   Chief Complaint: OBESITY Tina Chavez is here to discuss her progress with her obesity treatment plan. She is on the Category 2 plan and is following her eating plan approximately 80 % of the time. She states she is walking for 30 minutes 7 times per week. Amylee voices that the last two weeks have been easier to follow the plan, as she has been more disciplined. She has not indulged in sweets as much. 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 We were unable to weigh the patient today for this TeleHealth visit. She feels as if she has maintained weight since her last visit. She has lost 0 lbs since starting treatment with [place].  Pre-Diabetes Tina Chavez has a diagnosis of prediabetes based on her elevated Hgb A1c and was informed this puts her at greater risk of developing diabetes. Her Hgb A1c is at 5.7 on Saxenda and Metformin. She denies any GI side effects of medications. She continues to work on diet and exercise to decrease risk of diabetes. She denies nausea or hypoglycemia.  Depression with emotional eating behaviors Tina Chavez struggles with emotional eating and using food for comfort to the extent that it is negatively impacting her health. She often snacks when she is not hungry. Tina Chavez sometimes feels she is out of control and then feels guilty that she made poor food choices. Her symptoms are well controlled. She has been working on behavior modification techniques to help reduce her emotional eating and has been somewhat successful. Tina Chavez denies chest pain, chest pressure or headache. She shows no  sign of suicidal or homicidal ideations.  Depression screen PHQ 2/9 03/26/2017  Decreased Interest 1  Down, Depressed, Hopeless 0  PHQ - 2 Score 1  Altered sleeping 1  Tired, decreased energy 1  Change in appetite 1  Feeling bad or failure about yourself  0  Trouble concentrating 0  Moving slowly or fidgety/restless 0  Suicidal thoughts 0  PHQ-9 Score 4  Difficult doing work/chores Not difficult at all    ASSESSMENT AND PLAN:  No diagnosis found.  PLAN:  Pre-Diabetes Tina Chavez will continue to work on weight loss, exercise, and decreasing simple carbohydrates in her diet to help decrease the risk of diabetes. She was informed that eating too many simple carbohydrates or too many calories at one sitting increases the likelihood of GI side effects. Belanna agreed to continue metformin 500 mg qAM #30 with no refills and follow up with [place] as directed to monitor her progress.  Depression with Emotional Eating Behaviors We discussed behavior modification techniques today to help Tina Chavez deal with her emotional eating and depression. She has agreed to continue Wellbutrin SR 150 mg qd #30 with no refills and follow up as directed.  Obesity Tina Chavez is currently in the action stage of change. As such, her goal is to continue with weight loss efforts She has agreed to follow the Category 2 plan Tina Chavez has been instructed to work up to a goal of 150 minutes of combined cardio and strengthening exercise per week for weight loss and overall health benefits. We discussed the following Behavioral Modification  Strategies today: planning for success, decreasing simple carbohydrates, work on meal planning and easy cooking plans, ways to avoid boredom eating and ways to avoid night time snacking Prescription was written today for refill of pen needles #50 with no refills.  Blessen has agreed to follow up with our clinic in 2 weeks. She was informed of the importance of frequent follow up visits to maximize her  success with intensive lifestyle modifications for her multiple health conditions.  ALLERGIES: Allergies  Allergen Reactions   Sulfa Antibiotics Anaphylaxis   Pollen Extract     MEDICATIONS: Current Outpatient Medications on File Prior to Visit  Medication Sig Dispense Refill   buPROPion (WELLBUTRIN SR) 150 MG 12 hr tablet Take 1 tablet (150 mg total) by mouth daily. 30 tablet 0   Calcium Carbonate-Vitamin D3 (CALCIUM 600-D) 600-400 MG-UNIT TABS Takes 2 tablets daily.     conjugated estrogens (PREMARIN) vaginal cream 1/2 gram vaginally twice weekly 30 g 2   diazepam (VALIUM) 10 MG tablet once. With dental appt  0   Insulin Pen Needle (BD PEN NEEDLE NANO U/F) 32G X 4 MM MISC 1 each by Does not apply route daily. 50 each 0   lidocaine (XYLOCAINE) 5 % ointment Apply a small pea-sized amount topically as directed no more than once daily 30 g 0   Liraglutide -Weight Management (SAXENDA) 18 MG/3ML SOPN Inject 3 mg into the skin daily. 5 pen 0   loratadine (CLARITIN) 10 MG tablet Take 10 mg by mouth daily.     metFORMIN (GLUCOPHAGE) 500 MG tablet Take 1 tablet (500 mg total) by mouth daily with breakfast. 30 tablet 0   Multiple Vitamin (MULTIVITAMIN) tablet Take 1 tablet by mouth daily.     Omega-3 Fatty Acids (FISH OIL) 1000 MG CAPS 2 capsules daily.     omeprazole (PRILOSEC) 10 MG capsule Take 20 mg by mouth daily.      polyethylene glycol powder (GLYCOLAX/MIRALAX) powder Take 1 Container by mouth daily.     UNIFINE PENTIPS 31G X 6 MM MISC USE TO INJECT SAXENDA DAILY 100 each 0   Vitamin D, Ergocalciferol, (DRISDOL) 1.25 MG (50000 UT) CAPS capsule Take 1 capsule (50,000 Units total) by mouth every 7 (seven) days. 4 capsule 0   Wheat Dextrin (BENEFIBER PO) Take by mouth daily.     No current facility-administered medications on file prior to visit.     PAST MEDICAL HISTORY: Past Medical History:  Diagnosis Date   Abnormal Pap smear of cervix    age 58   Abnormal  uterine bleeding    Allergy    Chronic constipation    Depression    Endometriosis    GERD (gastroesophageal reflux disease)    Hemorrhoids    Hyperlipidemia    Obesity    Osteopenia    Pre-diabetes     PAST SURGICAL HISTORY: Past Surgical History:  Procedure Laterality Date   ABDOMINOPLASTY  2006   Dr. Lesli Albee   BREAST REDUCTION SURGERY  2006   DIL[place] AND [place] OF UTERUS  2016   FRACTURE SURGERY  1972   Arm    GYNECOLOGIC CRYOSURGERY     at age 67   HYSTEROSCOPY  2006   REDUCTION MAMMAPLASTY      SOCIAL HISTORY: Social History   Tobacco Use   Smoking status: Never Smoker   Smokeless tobacco: Never Used  Substance Use Topics   Alcohol use: No   Drug use: No    FAMILY HISTORY: Family History  Problem Relation Age of Onset   Hyperlipidemia Mother    Stroke Mother    Diabetes Mother    Hypertension Mother    Kidney disease Mother    Thyroid disease Mother    Eating disorder Mother    Obesity Mother    Arthritis Father    Heart disease Father    Sudden death Father    Hyperlipidemia Maternal Grandmother    Heart disease Maternal Grandmother    Mental illness Maternal Grandmother    Alcohol abuse Maternal Grandfather    Arthritis Paternal Grandmother    Hyperlipidemia Paternal Grandmother    Heart disease Paternal Grandmother    Alcohol abuse Paternal Grandfather     ROS: Review of Systems  Constitutional: Negative for weight loss.  Cardiovascular: Negative for chest pain.       Negative for chest pressure  Gastrointestinal: Negative for diarrhea, nausea and vomiting.  Neurological: Negative for headaches.  Endo/Heme/Allergies:       Negative for hypoglycemia  Psychiatric/Behavioral: Positive for depression. Negative for suicidal ideas.    PHYSICAL EXAM: Pt in no acute distress  RECENT LABS AND TESTS: BMET    Component Value Date/Time   NA 144 01/22/2018 0917   K 4.7 01/22/2018 0917   CL 103  01/22/2018 0917   CO2 22 01/22/2018 0917   GLUCOSE 93 01/22/2018 0917   BUN 26 (H) 01/22/2018 0917   CREATININE 0.89 01/22/2018 0917   CALCIUM 9.4 01/22/2018 0917   GFRNONAA 71 01/22/2018 0917   GFRAA 82 01/22/2018 0917   Lab Results  Component Value Date   HGBA1C 5.5 01/22/2018   HGBA1C 5.5 10/14/2017   HGBA1C 5.7 (H) 03/26/2017   Lab Results  Component Value Date   INSULIN 15.0 01/22/2018   INSULIN 6.8 10/14/2017   INSULIN 7.1 03/26/2017   CBC    Component Value Date/Time   WBC 7.3 03/26/2017 1157   RBC 4.88 03/26/2017 1157   HGB 14.6 03/26/2017 1157   HCT 44.6 03/26/2017 1157   MCV 91 03/26/2017 1157   MCH 29.9 03/26/2017 1157   MCHC 32.7 03/26/2017 1157   RDW 13.2 03/26/2017 1157   LYMPHSABS 2.7 03/26/2017 1157   EOSABS 0.2 03/26/2017 1157   BASOSABS 0.0 03/26/2017 1157   Iron/TIBC/Ferritin/ %Sat No results found for: IRON, TIBC, FERRITIN, IRONPCTSAT Lipid Panel     Component Value Date/Time   CHOL 197 01/22/2018 0917   TRIG 108 01/22/2018 0917   HDL 62 01/22/2018 0917   CHOLHDL 3.1 10/14/2017 1029   LDLCALC 113 (H) 01/22/2018 0917   Hepatic Function Panel     Component Value Date/Time   PROT 7.0 01/22/2018 0917   ALBUMIN 4.7 01/22/2018 0917   AST 15 01/22/2018 0917   ALT 24 01/22/2018 0917   ALKPHOS 76 01/22/2018 0917   BILITOT 0.3 01/22/2018 0917      Component Value Date/Time   TSH 2.920 03/26/2017 1157     Ref. Range 01/22/2018 09:17  Vitamin D, 25-Hydroxy Latest Ref Range: 30.0 - 100.0 ng/mL 41.8    I, Doreene Nest, am acting as Location manager for Eber Jones, MD  I have reviewed the above documentation for accuracy and completeness, and I agree with the above. - Ilene Qua, MD

## 2018-05-06 ENCOUNTER — Telehealth: Payer: Self-pay | Admitting: *Deleted

## 2018-05-06 NOTE — Telephone Encounter (Signed)
Per [name], ok to schedule colonoscopy.  Spoke with pt- she needs a PV as well and is aware that PV will be done over the phone.  She will call back to schedule both PV and colonoscopy

## 2018-05-13 ENCOUNTER — Other Ambulatory Visit: Payer: Self-pay

## 2018-05-13 ENCOUNTER — Ambulatory Visit (INDEPENDENT_AMBULATORY_CARE_PROVIDER_SITE_OTHER): Payer: No Typology Code available for payment source | Admitting: Family Medicine

## 2018-05-13 ENCOUNTER — Encounter (INDEPENDENT_AMBULATORY_CARE_PROVIDER_SITE_OTHER): Payer: Self-pay | Admitting: Family Medicine

## 2018-05-13 DIAGNOSIS — F3289 Other specified depressive episodes: Secondary | ICD-10-CM | POA: Diagnosis not present

## 2018-05-13 DIAGNOSIS — E669 Obesity, unspecified: Secondary | ICD-10-CM

## 2018-05-13 DIAGNOSIS — E559 Vitamin D deficiency, unspecified: Secondary | ICD-10-CM | POA: Diagnosis not present

## 2018-05-13 DIAGNOSIS — R7303 Prediabetes: Secondary | ICD-10-CM

## 2018-05-13 DIAGNOSIS — Z6831 Body mass index (BMI) 31.0-31.9, adult: Secondary | ICD-10-CM

## 2018-05-13 MED ORDER — BUPROPION HCL ER (SR) 150 MG PO TB12: 150.0000 mg | ORAL_TABLET | Freq: Every day | ORAL | 0 refills | Status: DC

## 2018-05-13 MED ORDER — VITAMIN D (ERGOCALCIFEROL) 1.25 MG (50000 UNIT) PO CAPS: 50000.0000 [IU] | ORAL_CAPSULE | ORAL | 0 refills | Status: DC

## 2018-05-13 MED FILL — VIT D2 1.25 MG (50,000 UNIT: 1.25 MG | 28 days supply | Qty: 4 | Fill #0

## 2018-05-13 MED FILL — BUPROPION HCL SR 150 MG TAB: 150 | 30 days supply | Qty: 30 | Fill #0

## 2018-05-14 MED ORDER — INSULIN PEN NEEDLE 32G X 4 MM MISC: 1.0000 | Freq: Every day | 0 refills | Status: DC

## 2018-05-14 MED ORDER — INSULIN PEN NEEDLE 31G X 6 MM MISC: 0 refills | Status: DC

## 2018-05-14 MED FILL — UNIFINE PENTIPS 6MM 31G: 31G X 6 MM | 90 days supply | Qty: 100 | Fill #0

## 2018-05-14 NOTE — Progress Notes (Signed)
Office: [phone]  /  Fax: [phone] TeleHealth Visit:  Tina Chavez has verbally consented to this TeleHealth visit today. The patient is located at home, the provider is located at the [facility] office. The participants in this visit include the listed provider and patient. The visit was conducted today via face time.  HPI:   Chief Complaint: OBESITY Tina Chavez is here to discuss her progress with her obesity treatment plan. She is on the Category 2 plan and is following her eating plan approximately 90 % of the time. She states she is exercising 0 minutes 0 times per week. Tina Chavez had a relatively good couple of weeks not necessarily eating off the plan, but occasionally eating more snacks.  We were unable to weigh the patient today for this TeleHealth visit. She feels as if she has maintained her weight since her last visit. She has lost 0 lbs since starting treatment with [place].  Pre-Diabetes Tina Chavez has a diagnosis of pre-diabetes based on her elevated Hgb A1c and was informed this puts her at greater risk of developing diabetes. She notes occasional carbohydrate cravings and denies GI side effects of metformin. She continues to work on diet and exercise to decrease risk of diabetes. She denies nausea or hypoglycemia.  Vitamin D Deficiency Tina Chavez has a diagnosis of vitamin D deficiency. She is currently taking prescription Vit D. She notes fatigue and denies nausea, vomiting or muscle weakness.  Depression with Emotional Eating Behaviors Tina Chavez notes her symptoms are fairly well controlled. Tina Chavez struggles with emotional eating and using food for comfort to the extent that it is negatively impacting her health. She often snacks when she is not hungry. Tina Chavez sometimes feels she is out of control and then feels guilty that she made poor food choices. She has been working on behavior modification techniques to help reduce her emotional eating and has been somewhat successful. She  shows no sign of suicidal or homicidal ideations.  Depression screen PHQ 2/9 03/26/2017  Decreased Interest 1  Down, Depressed, Hopeless 0  PHQ - 2 Score 1  Altered sleeping 1  Tired, decreased energy 1  Change in appetite 1  Feeling bad or failure about yourself  0  Trouble concentrating 0  Moving slowly or fidgety/restless 0  Suicidal thoughts 0  PHQ-9 Score 4  Difficult doing work/chores Not difficult at all    ASSESSMENT AND PLAN:  Prediabetes  Vitamin D deficiency - Plan: Vitamin D, Ergocalciferol, (DRISDOL) 1.25 MG (50000 UT) CAPS capsule  Other depression - with emotional eating - Plan: buPROPion (WELLBUTRIN SR) 150 MG 12 hr tablet  Class 1 obesity with serious comorbidity and body mass index (BMI) of 31.0 to 31.9 in adult, unspecified obesity type  PLAN:  Pre-Diabetes Tina Chavez will continue to work on weight loss, exercise, and decreasing simple carbohydrates in her diet to help decrease the risk of diabetes. We dicussed metformin including benefits and risks. She was informed that eating too many simple carbohydrates or too many calories at one sitting increases the likelihood of GI side effects. [name] discontinue metformin and a prescription was not written today. We will refill pen needles for 1 month. [name] agrees to follow up with our clinic in 3 weeks as directed to monitor her progress.  Vitamin D Deficiency Tina Chavez was informed that low vitamin D levels contributes to fatigue and are associated with obesity, breast, and colon cancer. Tina Chavez agrees to continue taking prescription Vit D @50 ,000 IU every week #4 and we will refill  for 1 month. She will follow up for routine testing of vitamin D, at least 2-3 times per year. She was informed of the risk of over-replacement of vitamin D and agrees to not increase her dose unless she discusses this with [place] first. [name] agrees to follow up with our clinic in 3 weeks.  Depression with Emotional Eating Behaviors We discussed  behavior modification techniques today to help Tina Chavez deal with her emotional eating and depression. Tina Chavez agrees to follow up with our clinic in 3 weeks. Tina Chavez agrees to continue taking Wellbutrin SR 150 mg PO daily #30 and we will refill for 1 month. Tina Chavez agrees to follow up with our clinic in 3 weeks.  Obesity Tina Chavez is currently in the action stage of change. As such, her goal is to continue with weight loss efforts She has agreed to follow the Category 2 plan Tina Chavez has been instructed to work up to a goal of 150 minutes of combined cardio and strengthening exercise per week or plan to continue walking with the dogs for weight loss and overall health benefits. We discussed the following Behavioral Modification Strategies today: increasing lean protein intake, increasing vegetables and work on meal planning and easy cooking plans, keeping healthy foods in the home, and planning for success   Tina Chavez has agreed to follow up with our clinic in 3 weeks. She was informed of the importance of frequent follow up visits to maximize her success with intensive lifestyle modifications for her multiple health conditions.  ALLERGIES: Allergies  Allergen Reactions  . Sulfa Antibiotics Anaphylaxis  . Pollen Extract     MEDICATIONS: Current Outpatient Medications on File Prior to Visit  Medication Sig Dispense Refill  . Calcium Carbonate-Vitamin D3 (CALCIUM 600-D) 600-400 MG-UNIT TABS Takes 2 tablets daily.    [name] conjugated estrogens (PREMARIN) vaginal cream 1/2 gram vaginally twice weekly 30 g 2  . diazepam (VALIUM) 10 MG tablet once. With dental appt  0  . Insulin Pen Needle (BD PEN NEEDLE NANO U/F) 32G X 4 MM MISC 1 each by Does not apply route daily. 50 each 0  . lidocaine (XYLOCAINE) 5 % ointment Apply a small pea-sized amount topically as directed no more than once daily 30 g 0  . Liraglutide -Weight Management (SAXENDA) 18 MG/3ML SOPN Inject 3 mg into the skin daily. 5 pen 0  . loratadine  (CLARITIN) 10 MG tablet Take 10 mg by mouth daily.    . Multiple Vitamin (MULTIVITAMIN) tablet Take 1 tablet by mouth daily.    . Omega-3 Fatty Acids (FISH OIL) 1000 MG CAPS 2 capsules daily.    [name] omeprazole (PRILOSEC) 10 MG capsule Take 20 mg by mouth daily.     . polyethylene glycol powder (GLYCOLAX/MIRALAX) powder Take 1 Container by mouth daily.    [name] UNIFINE PENTIPS 31G X 6 MM MISC USE TO INJECT SAXENDA DAILY 100 each 0  . Wheat Dextrin (BENEFIBER PO) Take by mouth daily.     No current facility-administered medications on file prior to visit.     PAST MEDICAL HISTORY: Past Medical History:  Diagnosis Date  . Abnormal Pap smear of cervix    age 37  . Abnormal uterine bleeding   . Allergy   . Chronic constipation   . Depression   . Endometriosis   . GERD (gastroesophageal reflux disease)   . Hemorrhoids   . Hyperlipidemia   . Obesity   . Osteopenia   . Pre-diabetes     PAST SURGICAL HISTORY: Past  Surgical History:  Procedure Laterality Date  . ABDOMINOPLASTY  2006   Dr. Lesli Albee  . BREAST REDUCTION SURGERY  2006  . [place] OF UTERUS  2016  . FRACTURE SURGERY  1972   Arm   . GYNECOLOGIC CRYOSURGERY     at age 11  . HYSTEROSCOPY  2006  . REDUCTION MAMMAPLASTY      SOCIAL HISTORY: Social History   Tobacco Use  . Smoking status: Never Smoker  . Smokeless tobacco: Never Used  Substance Use Topics  . Alcohol use: No  . Drug use: No    FAMILY HISTORY: Family History  Problem Relation Age of Onset  . Hyperlipidemia Mother   . Stroke Mother   . Diabetes Mother   . Hypertension Mother   . Kidney disease Mother   . Thyroid disease Mother   . Eating disorder Mother   . Obesity Mother   . Arthritis Father   . Heart disease Father   . Sudden death Father   . Hyperlipidemia Maternal Grandmother   . Heart disease Maternal Grandmother   . Mental illness Maternal Grandmother   . Alcohol abuse Maternal Grandfather   . Arthritis Paternal  Grandmother   . Hyperlipidemia Paternal Grandmother   . Heart disease Paternal Grandmother   . Alcohol abuse Paternal Grandfather     ROS: Review of Systems  Constitutional: Positive for malaise/fatigue. Negative for weight loss.  Gastrointestinal: Negative for nausea and vomiting.  Musculoskeletal:       Negative muscle weakness  Endo/Heme/Allergies:       Negative hypoglycemia  Psychiatric/Behavioral: Positive for depression. Negative for suicidal ideas.    PHYSICAL EXAM: Pt in no acute distress  RECENT LABS AND TESTS: BMET    Component Value Date/Time   NA 144 01/22/2018 0917   K 4.7 01/22/2018 0917   CL 103 01/22/2018 0917   CO2 22 01/22/2018 0917   GLUCOSE 93 01/22/2018 0917   BUN 26 (H) 01/22/2018 0917   CREATININE 0.89 01/22/2018 0917   CALCIUM 9.4 01/22/2018 0917   GFRNONAA 71 01/22/2018 0917   GFRAA 82 01/22/2018 0917   Lab Results  Component Value Date   HGBA1C 5.5 01/22/2018   HGBA1C 5.5 10/14/2017   HGBA1C 5.7 (H) 03/26/2017   Lab Results  Component Value Date   INSULIN 15.0 01/22/2018   INSULIN 6.8 10/14/2017   INSULIN 7.1 03/26/2017   CBC    Component Value Date/Time   WBC 7.3 03/26/2017 1157   RBC 4.88 03/26/2017 1157   HGB 14.6 03/26/2017 1157   HCT 44.6 03/26/2017 1157   MCV 91 03/26/2017 1157   MCH 29.9 03/26/2017 1157   MCHC 32.7 03/26/2017 1157   RDW 13.2 03/26/2017 1157   LYMPHSABS 2.7 03/26/2017 1157   EOSABS 0.2 03/26/2017 1157   BASOSABS 0.0 03/26/2017 1157   Iron/TIBC/Ferritin/ %Sat No results found for: IRON, TIBC, FERRITIN, IRONPCTSAT Lipid Panel     Component Value Date/Time   CHOL 197 01/22/2018 0917   TRIG 108 01/22/2018 0917   HDL 62 01/22/2018 0917   CHOLHDL 3.1 10/14/2017 1029   LDLCALC 113 (H) 01/22/2018 0917   Hepatic Function Panel     Component Value Date/Time   PROT 7.0 01/22/2018 0917   ALBUMIN 4.7 01/22/2018 0917   AST 15 01/22/2018 0917   ALT 24 01/22/2018 0917   ALKPHOS 76 01/22/2018 0917    BILITOT 0.3 01/22/2018 0917      Component Value Date/Time   TSH 2.920 03/26/2017 1157  I, Trixie Dredge, am acting as transcriptionist for Ilene Qua, MD  I have reviewed the above documentation for accuracy and completeness, and I agree with the above. - Ilene Qua, MD

## 2018-06-02 ENCOUNTER — Ambulatory Visit (INDEPENDENT_AMBULATORY_CARE_PROVIDER_SITE_OTHER): Payer: No Typology Code available for payment source | Admitting: Family Medicine

## 2018-06-02 ENCOUNTER — Encounter: Payer: Self-pay | Admitting: Gastroenterology

## 2018-06-23 ENCOUNTER — Ambulatory Visit: Payer: No Typology Code available for payment source | Admitting: *Deleted

## 2018-06-23 ENCOUNTER — Other Ambulatory Visit: Payer: Self-pay

## 2018-06-23 VITALS — Ht 62.5 in | Wt 190.0 lb

## 2018-06-23 DIAGNOSIS — Z1211 Encounter for screening for malignant neoplasm of colon: Secondary | ICD-10-CM

## 2018-06-23 MED ORDER — SUPREP BOWEL PREP KIT 17.5-3.13-1.6 GM/177ML PO SOLN: 1.0000 | Freq: Once | ORAL | 0 refills | Status: AC

## 2018-06-23 MED FILL — SUPREP BOWEL PREP KIT: 17.5-3.13-1 | 1 days supply | Qty: 354 | Fill #0

## 2018-06-23 NOTE — Progress Notes (Signed)
No egg or soy allergy known to patient  No issues with past sedation with any surgeries  or procedures, no intubation problems  No diet pills per patient No home 02 use per patient  No blood thinners per patient  Pt states issues with constipation - PRN Miralax but chronic constipation- Maybe has a bm once a week or longer- stools are very hard- it hurts to stool and has to strain every time to have a BM- nothing daily to have a BM- it takes awhile to even have any results - will do a 2 day prep  And pt to start miralax daily starting 7-1 , 5 days before  No A fib or A flutter  EMMI video sent to pt's e mail   Pt verified name, DOB, address and insurance during PV today. Pt mailed instruction packet to included paper to complete and mail back to [facility] with addressed and stamped envelope, Emmi video, copy of consent form to read and not return, and instruction. PV completed over the phone. Pt encouraged to call with questions or issues  Pt is aware that care partner will wait in the car during parking lot; if they feel like they will be too hot to wait in the car; they may wait in the lobby.  We want them to wear a mask (we do not have any that we can provide them), practice social distancing, and we will check their temperatures when they get here.  I did remind patient that their care partner needs to stay in the parking lot the entire time. Pt will wear mask into building

## 2018-06-24 ENCOUNTER — Ambulatory Visit (INDEPENDENT_AMBULATORY_CARE_PROVIDER_SITE_OTHER): Payer: No Typology Code available for payment source | Admitting: Family Medicine

## 2018-06-26 ENCOUNTER — Telehealth (INDEPENDENT_AMBULATORY_CARE_PROVIDER_SITE_OTHER): Payer: No Typology Code available for payment source | Admitting: Family Medicine

## 2018-06-26 ENCOUNTER — Other Ambulatory Visit: Payer: Self-pay

## 2018-06-26 DIAGNOSIS — E669 Obesity, unspecified: Secondary | ICD-10-CM | POA: Diagnosis not present

## 2018-06-26 DIAGNOSIS — F3289 Other specified depressive episodes: Secondary | ICD-10-CM

## 2018-06-26 DIAGNOSIS — R7303 Prediabetes: Secondary | ICD-10-CM

## 2018-06-26 DIAGNOSIS — Z6831 Body mass index (BMI) 31.0-31.9, adult: Secondary | ICD-10-CM | POA: Diagnosis not present

## 2018-06-30 NOTE — Progress Notes (Signed)
Office: [phone]  /  Fax: [phone] TeleHealth Visit:  Tina Chavez has verbally consented to this TeleHealth visit today. The patient is located at home, the provider is located at the [employer] [facility] office. The participants in this visit include the listed provider and patient. The visit was conducted today via Facetime.  HPI:   Chief Complaint: OBESITY Tina Chavez is here to discuss her progress with her obesity treatment plan. She is on the  follow the Category 2 plan and is following her eating plan approximately 50 % of the time. She states she is exercising walks for 30-45 minutes 3 times per week. Tina Chavez voices currently she is struggling with Category 2 plan and maintaining motivation. She does not feel plan has worked because she has not lost any weight. She wants to go back on meal plan she was on prior to program.  We were unable to weigh the patient today for this TeleHealth visit. She feels as if she has gained weight since her last visit. She has lost 0 lbs since starting treatment with [place].  Pre-Diabetes Tina Chavez has a diagnosis of prediabetes based on her elevated HgA1c. and was informed this puts her at greater risk of developing diabetes. Her last HgA1c was controlled. She is not taking metformin currently and continues to work on diet and exercise to decrease risk of diabetes. She denies nausea or hypoglycemia. She reports still experiencing carbohydrate cravings.   Depression with emotional eating behaviors Tina Chavez is struggling with emotional eating and using food for comfort to the extent that it is negatively impacting her health. She often snacks when she is not hungry. Tina Chavez sometimes feels she is out of control and then feels guilty that she made poor food choices. She has been working on behavior modification techniques to help reduce her emotional eating and has been somewhat successful. She shows no sign of suicidal or homicidal ideations.  Depression screen  PHQ 2/9 03/26/2017  Decreased Interest 1  Down, Depressed, Hopeless 0  PHQ - 2 Score 1  Altered sleeping 1  Tired, decreased energy 1  Change in appetite 1  Feeling bad or failure about yourself  0  Trouble concentrating 0  Moving slowly or fidgety/restless 0  Suicidal thoughts 0  PHQ-9 Score 4  Difficult doing work/chores Not difficult at all    ASSESSMENT AND PLAN:  Prediabetes  Other depression  Class 1 obesity with serious comorbidity and body mass index (BMI) of 31.0 to 31.9 in adult, unspecified obesity type  PLAN: Pre-Diabetes Tina Chavez will continue to work on weight loss, exercise, and decreasing simple carbohydrates in her diet to help decrease the risk of diabetes. We dicussed metformin including benefits and risks. She was informed that eating too many simple carbohydrates or too many calories at one sitting increases the likelihood of GI side effects. Kora agrees to follow up on labs with PCP. Tina Chavez agreed to follow up with [place] as directed to monitor her progress.  Depression with Emotional Eating Behaviors We discussed behavior modification techniques today to help Darrel deal with her emotional eating and depression. She has agreed to taper Wellbutrin 1/2 pill daily until she runs out  and agreed to follow up as directed.  Obesity Tina Chavez is currently in the action stage of change. As such, her goal is to continue with weight loss efforts She has agreed to portion control better and make smarter food choices, such as increase vegetables and decrease simple carbohydrates  Tina Chavez has been instructed to  work up to a goal of 150 minutes of combined cardio and strengthening exercise per week for weight loss and overall health benefits. We discussed the following Behavioral Modification Stratagies today: keeping healthy foods in the home, better snacking choices, planning for success, increasing lean protein intake, increasing vegetables and work on meal planning and easy cooking  plans.  Tina Chavez has agreed to follow up with our clinic when she is ready. She was informed of the importance of frequent follow up visits to maximize her success with intensive lifestyle modifications for her multiple health conditions.  ALLERGIES: Allergies  Allergen Reactions   Sulfa Antibiotics Anaphylaxis   Pollen Extract     MEDICATIONS: Current Outpatient Medications on File Prior to Visit  Medication Sig Dispense Refill   buPROPion (WELLBUTRIN SR) 150 MG 12 hr tablet Take 1 tablet (150 mg total) by mouth daily. 30 tablet 0   Calcium Carbonate-Vitamin D3 (CALCIUM 600-D) 600-400 MG-UNIT TABS Takes 2 tablets daily.     conjugated estrogens (PREMARIN) vaginal cream 1/2 gram vaginally twice weekly 30 g 2   diazepam (VALIUM) 10 MG tablet once. With dental appt  0   Insulin Pen Needle (BD PEN NEEDLE NANO U/F) 32G X 4 MM MISC 1 each by Does not apply route daily. 50 each 0   Insulin Pen Needle (UNIFINE PENTIPS) 31G X 6 MM MISC USE TO INJECT SAXENDA DAILY 100 each 0   lidocaine (XYLOCAINE) 5 % ointment Apply a small pea-sized amount topically as directed no more than once daily 30 g 0   Liraglutide -Weight Management (SAXENDA) 18 MG/3ML SOPN Inject 3 mg into the skin daily. 5 pen 0   loratadine (CLARITIN) 10 MG tablet Take 10 mg by mouth daily.     Multiple Vitamin (MULTIVITAMIN) tablet Take 1 tablet by mouth daily.     Omega-3 Fatty Acids (FISH OIL) 1000 MG CAPS 2 capsules daily.     omeprazole (PRILOSEC) 10 MG capsule Take 20 mg by mouth daily.      polyethylene glycol powder (GLYCOLAX/MIRALAX) powder Take 1 Container by mouth daily.     Vitamin D, Ergocalciferol, (DRISDOL) 1.25 MG (50000 UT) CAPS capsule Take 1 capsule (50,000 Units total) by mouth every 7 (seven) days. 4 capsule 0   Wheat Dextrin (BENEFIBER PO) Take by mouth daily.     No current facility-administered medications on file prior to visit.     PAST MEDICAL HISTORY: Past Medical History:  Diagnosis  Date   Abnormal Pap smear of cervix    age 15   Abnormal uterine bleeding    Allergy    Chronic constipation    Depression    Endometriosis    GERD (gastroesophageal reflux disease)    Hemorrhoids    Hyperlipidemia    Obesity    Osteopenia    Pre-diabetes     PAST SURGICAL HISTORY: Past Surgical History:  Procedure Laterality Date   ABDOMINOPLASTY  2006   Dr. Lesli Albee   BREAST REDUCTION SURGERY  2006   COLONOSCOPY     normal- but [place] in [place]- age 28    [place]  2016   [place]   Arm    GYNECOLOGIC CRYOSURGERY     at age 55   HYSTEROSCOPY  2006   REDUCTION MAMMAPLASTY      SOCIAL HISTORY: Social History   Tobacco Use   Smoking status: Never Smoker   Smokeless tobacco: Never Used  Substance Use Topics  Alcohol use: No   Drug use: No    FAMILY HISTORY: Family History  Problem Relation Age of Onset   Hyperlipidemia Mother    Stroke Mother    Diabetes Mother    Hypertension Mother    Kidney disease Mother    Thyroid disease Mother    Eating disorder Mother    Obesity Mother    Arthritis Father    Heart disease Father    Sudden death Father    Colon polyps Father    Hyperlipidemia Maternal Grandmother    Heart disease Maternal Grandmother    Mental illness Maternal Grandmother    Alcohol abuse Maternal Grandfather    Arthritis Paternal Grandmother    Hyperlipidemia Paternal Grandmother    Heart disease Paternal Grandmother    Alcohol abuse Paternal Grandfather    Colon polyps Sister    Colon cancer Neg Hx    Esophageal cancer Neg Hx    Rectal cancer Neg Hx    Stomach cancer Neg Hx     ROS: Review of Systems  Gastrointestinal: Negative for nausea.  Endo/Heme/Allergies:       Negative for hypoglycemia  Psychiatric/Behavioral: Positive for depression. Negative for suicidal ideas.    PHYSICAL EXAM: Pt in no acute distress  RECENT LABS AND  TESTS: BMET    Component Value Date/Time   NA 144 01/22/2018 0917   K 4.7 01/22/2018 0917   CL 103 01/22/2018 0917   CO2 22 01/22/2018 0917   GLUCOSE 93 01/22/2018 0917   BUN 26 (H) 01/22/2018 0917   CREATININE 0.89 01/22/2018 0917   CALCIUM 9.4 01/22/2018 0917   GFRNONAA 71 01/22/2018 0917   GFRAA 82 01/22/2018 0917   Lab Results  Component Value Date   HGBA1C 5.5 01/22/2018   HGBA1C 5.5 10/14/2017   HGBA1C 5.7 (H) 03/26/2017   Lab Results  Component Value Date   INSULIN 15.0 01/22/2018   INSULIN 6.8 10/14/2017   INSULIN 7.1 03/26/2017   CBC    Component Value Date/Time   WBC 7.3 03/26/2017 1157   RBC 4.88 03/26/2017 1157   HGB 14.6 03/26/2017 1157   HCT 44.6 03/26/2017 1157   MCV 91 03/26/2017 1157   MCH 29.9 03/26/2017 1157   MCHC 32.7 03/26/2017 1157   RDW 13.2 03/26/2017 1157   LYMPHSABS 2.7 03/26/2017 1157   EOSABS 0.2 03/26/2017 1157   BASOSABS 0.0 03/26/2017 1157   Iron/TIBC/Ferritin/ %Sat No results found for: IRON, TIBC, FERRITIN, IRONPCTSAT Lipid Panel     Component Value Date/Time   CHOL 197 01/22/2018 0917   TRIG 108 01/22/2018 0917   HDL 62 01/22/2018 0917   CHOLHDL 3.1 10/14/2017 1029   LDLCALC 113 (H) 01/22/2018 0917   Hepatic Function Panel     Component Value Date/Time   PROT 7.0 01/22/2018 0917   ALBUMIN 4.7 01/22/2018 0917   AST 15 01/22/2018 0917   ALT 24 01/22/2018 0917   ALKPHOS 76 01/22/2018 0917   BILITOT 0.3 01/22/2018 0917      Component Value Date/Time   TSH 2.920 03/26/2017 1157      I, Renee Ramus, am acting as Location manager for Ilene Qua, MD  I have reviewed the above documentation for accuracy and completeness, and I agree with the above. - Ilene Qua, MD

## 2018-07-03 ENCOUNTER — Telehealth: Payer: Self-pay | Admitting: Gastroenterology

## 2018-07-03 NOTE — Telephone Encounter (Signed)
Left message on Vmail to call back regarding Covid-19 screening questions °Covid-19 Screening Questions ° °Do you now or have you had a fever in the last 14 days?      ° °Do you have any respiratory symptoms of shortness of breath or cough now or in the last 14 days?     ° °Do you have any family members or close contacts with diagnosed or suspected Covid-19 in the past 14 days?      ° °Have you been tested for Covid-19 and found to be positive?     ° ° °

## 2018-07-03 NOTE — Telephone Encounter (Signed)
Patient called back and answered no to all questions.

## 2018-07-07 ENCOUNTER — Encounter: Payer: Self-pay | Admitting: Gastroenterology

## 2018-07-07 ENCOUNTER — Ambulatory Visit (AMBULATORY_SURGERY_CENTER): Payer: No Typology Code available for payment source | Admitting: Gastroenterology

## 2018-07-07 ENCOUNTER — Other Ambulatory Visit: Payer: Self-pay

## 2018-07-07 VITALS — BP 126/71 | HR 66 | Temp 98.4°F | Resp 25 | Ht 62.5 in | Wt 190.0 lb

## 2018-07-07 DIAGNOSIS — D124 Benign neoplasm of descending colon: Secondary | ICD-10-CM

## 2018-07-07 DIAGNOSIS — Z8371 Family history of colonic polyps: Secondary | ICD-10-CM

## 2018-07-07 DIAGNOSIS — Z800 Family history of malignant neoplasm of digestive organs: Secondary | ICD-10-CM

## 2018-07-07 DIAGNOSIS — D120 Benign neoplasm of cecum: Secondary | ICD-10-CM | POA: Diagnosis not present

## 2018-07-07 DIAGNOSIS — Z1211 Encounter for screening for malignant neoplasm of colon: Secondary | ICD-10-CM

## 2018-07-07 DIAGNOSIS — D123 Benign neoplasm of transverse colon: Secondary | ICD-10-CM | POA: Diagnosis not present

## 2018-07-07 HISTORY — PX: COLONOSCOPY: SHX174

## 2018-07-07 MED ORDER — SODIUM CHLORIDE 0.9 % IV SOLN: 500.0000 mL | Freq: Once | INTRAVENOUS | Status: DC

## 2018-07-07 NOTE — Progress Notes (Signed)
Report to PACU, RN, vss, BBS= Clear.  

## 2018-07-07 NOTE — Progress Notes (Signed)
Courtney Washington took temp and Judy Branson took vitals. 

## 2018-07-07 NOTE — Patient Instructions (Signed)
Discharge instructions given. Handouts on polyps and diverticulosis. Resume previous medications. YOU HAD AN ENDOSCOPIC PROCEDURE TODAY AT [facility]:   Refer to the procedure report that was given to you for any specific questions about what was found during the examination.  If the procedure report does not answer your questions, please call your gastroenterologist to clarify.  If you requested that your care partner not be given the details of your procedure findings, then the procedure report has been included in a sealed envelope for you to review at your convenience later.  YOU SHOULD EXPECT: Some feelings of bloating in the abdomen. Passage of more gas than usual.  Walking can help get rid of the air that was put into your GI tract during the procedure and reduce the bloating. If you had a lower endoscopy (such as a colonoscopy or flexible sigmoidoscopy) you may notice spotting of blood in your stool or on the toilet paper. If you underwent a bowel prep for your procedure, you may not have a normal bowel movement for a few days.  Please Note:  You might notice some irritation and congestion in your nose or some drainage.  This is from the oxygen used during your procedure.  There is no need for concern and it should clear up in a day or so.  SYMPTOMS TO REPORT IMMEDIATELY:   Following lower endoscopy (colonoscopy or flexible sigmoidoscopy):  Excessive amounts of blood in the stool  Significant tenderness or worsening of abdominal pains  Swelling of the abdomen that is new, acute  Fever of 100F or higher   For urgent or emergent issues, a gastroenterologist can be reached at any hour by calling [phone].   DIET:  We do recommend a small meal at first, but then you may proceed to your regular diet.  Drink plenty of fluids but you should avoid alcoholic beverages for 24 hours.  ACTIVITY:  You should plan to take it easy for the rest of today and you should NOT  DRIVE or use heavy machinery until tomorrow (because of the sedation medicines used during the test).    FOLLOW UP: Our staff will call the number listed on your records 48-72 hours following your procedure to check on you and address any questions or concerns that you may have regarding the information given to you following your procedure. If we do not reach you, we will leave a message.  We will attempt to reach you two times.  During this call, we will ask if you have developed any symptoms of COVID 19. If you develop any symptoms (ie: fever, flu-like symptoms, shortness of breath, cough etc.) before then, please call [phone].  If you test positive for Covid 19 in the 2 weeks post procedure, please call and report this information to [place].    If any biopsies were taken you will be contacted by phone or by letter within the next 1-3 weeks.  Please call us at [phone] if you have not heard about the biopsies in 3 weeks.    SIGNATURES/CONFIDENTIALITY: You and/or your care partner have signed paperwork which will be entered into your electronic medical record.  These signatures attest to the fact that that the information above on your After Visit Summary has been reviewed and is understood.  Full responsibility of the confidentiality of this discharge information lies with you and/or your care-partner.

## 2018-07-07 NOTE — Progress Notes (Signed)
Called to room to assist during endoscopic procedure.  Patient ID and intended procedure confirmed with present staff. Received instructions for my participation in the procedure from the performing physician.  

## 2018-07-07 NOTE — Op Note (Addendum)
[facility] Patient Name: Tina Chavez Procedure Date: 07/07/2018 9:10 AM MRN: [id number] Endoscopist: Thornton Park MD, MD Age: 58 Referring MD:  Date of Birth: Jan 09, 1960 Gender: Female Account #: [id number] Procedure:                Colonoscopy Indications:              Screening for colorectal malignant neoplasm                           Last colonoscopy at age 89 in [place]                           Father and sister with colon polyps. No known                            family history of colon cancer.                           No baseline GI symptoms Medicines:                See the Anesthesia note for documentation of the                            administered medications Procedure:                Pre-Anesthesia Assessment:                           - Prior to the procedure, a History and Physical                            was performed, and patient medications and                            allergies were reviewed. The patient's tolerance of                            previous anesthesia was also reviewed. The risks                            and benefits of the procedure and the sedation                            options and risks were discussed with the patient.                            All questions were answered, and informed consent                            was obtained. Prior Anticoagulants: The patient has                            taken no previous anticoagulant or antiplatelet  agents. ASA Grade Assessment: II - A patient with                            mild systemic disease. After reviewing the risks                            and benefits, the patient was deemed in                            satisfactory condition to undergo the procedure.                           After obtaining informed consent, the colonoscope                            was passed under direct vision. Throughout the   procedure, the patient's blood pressure, pulse, and                            oxygen saturations were monitored continuously. The                            Model CF-HQ190L [id number]) scope was introduced                            through the anus and advanced to the the terminal                            ileum, with identification of the appendiceal                            orifice and IC valve. A second forward view of the                            right colon was performed. The colonoscopy was                            performed without difficulty. The patient tolerated                            the procedure well. The quality of the bowel                            preparation was good. The terminal ileum, ileocecal                            valve, appendiceal orifice, and rectum were                            photographed. Scope In: 9:20:58 AM Scope Out: 9:37:44 AM Scope Withdrawal Time: 0 hours 10 minutes 10 seconds  Total Procedure Duration: 0 hours 16 minutes 46 seconds  Findings:                 The  perianal and digital rectal examinations were                            normal.                           Many small and large-mouthed diverticula were found                            in the sigmoid colon, descending colon, transverse                            colon and ascending colon.                           A 1 mm polyp was found in the cecum. The polyp was                            sessile. The polyp was removed with a cold biopsy                            forceps. Resection and retrieval were complete.                            Estimated blood loss was minimal.                           A 3 mm polyp was found in the descending colon. The                            polyp was sessile. The polyp was removed with a                            cold snare. Resection and retrieval were complete.                            Estimated blood loss was minimal.                            A 1 mm polyp was found in the descending colon. The                            polyp was sessile. The polyp was removed with a                            cold biopsy forceps. Resection and retrieval were                            complete. Estimated blood loss was minimal.                           A 2 mm polyp was found in the hepatic flexure. The  polyp was sessile. The polyp was removed with a                            cold biopsy forceps. Resection and retrieval were                            complete. Estimated blood loss was minimal.                           The exam was otherwise without abnormality on                            direct and retroflexion views. Complications:            No immediate complications. Estimated blood loss:                            Minimal. Estimated Blood Loss:     Estimated blood loss was minimal. Impression:               - Diverticulosis in the sigmoid colon, in the                            descending colon, in the transverse colon and in                            the ascending colon.                           - One 1 mm polyp in the cecum, removed with a cold                            biopsy forceps. Resected and retrieved.                           - One 3 mm polyp in the descending colon, removed                            with a cold snare. Resected and retrieved.                           - One 1 mm polyp in the descending colon, removed                            with a cold biopsy forceps. Resected and retrieved.                           - One 2 mm polyp at the hepatic flexure, removed                            with a cold biopsy forceps. Resected and retrieved.                           - The examination was otherwise normal  on direct                            and retroflexion views. Recommendation:           - Patient has a contact number available for                            emergencies.  The signs and symptoms of potential                            delayed complications were discussed with the                            patient. Return to normal activities tomorrow.                            Written discharge instructions were provided to the                            patient.                           - Resume regular diet today. High fiber diet                            encouraged.                           - Continue present medications.                           - Await pathology results.                           - Repeat colonoscopy date to be determined after                            pending pathology results are reviewed for                            surveillance based on pathology results. If at                            least 3 polyps are adenomatous, repeat colonoscopy                            in 3 years. If 1 or 2 polyps are adenomatous,                            repeat colonoscopy in 7 years. Thornton Park MD, MD 07/07/2018 9:44:25 AM This report has been signed electronically.

## 2018-07-07 NOTE — Progress Notes (Signed)
Pt's states no medical or surgical changes since previsit or office visit. 

## 2018-07-09 ENCOUNTER — Telehealth: Payer: Self-pay

## 2018-07-09 NOTE — Telephone Encounter (Signed)
  Follow up Call-  Call back number 07/07/2018  Post procedure Call Back phone  # [phone]  Permission to leave phone message Yes  Some recent data might be hidden     No ID on answering machine No message left Will try again midday

## 2018-07-09 NOTE — Telephone Encounter (Signed)
Left message on 2nd follow up call. 

## 2018-07-10 ENCOUNTER — Encounter: Payer: Self-pay | Admitting: Gastroenterology

## 2018-12-05 IMAGING — MG DIGITAL SCREENING BILATERAL MAMMOGRAM WITH TOMO AND CAD
8 series · 8 of 24 positions shown · non-contrast
Comparison: Previous exam(s).

ACR Breast Density Category a: The breast tissue is almost entirely
fatty.

CLINICAL DATA: Screening.

EXAM:
DIGITAL SCREENING BILATERAL MAMMOGRAM WITH TOMO AND CAD

[R CC synth-2D]
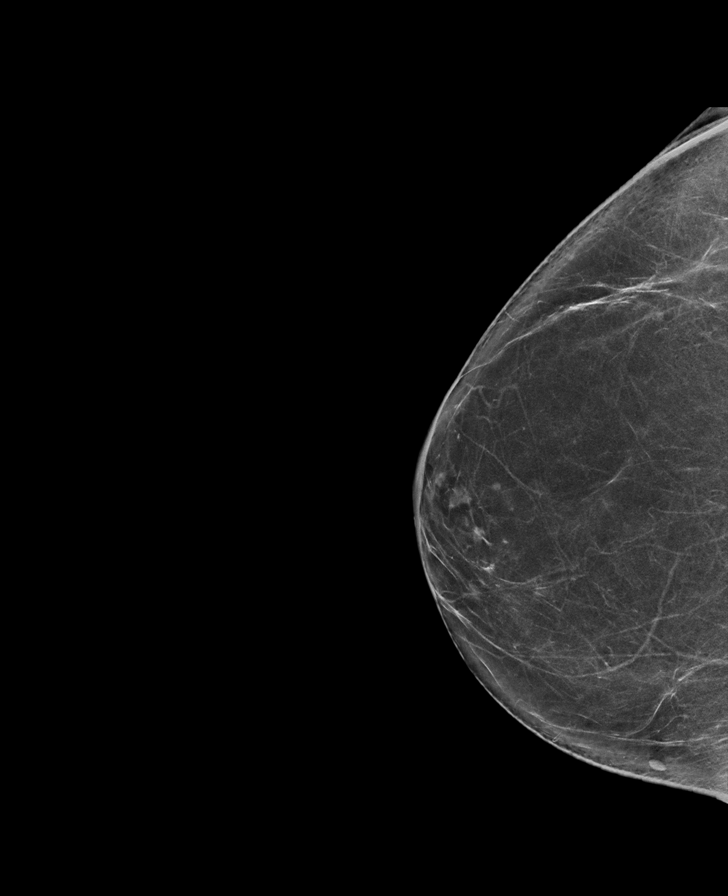

[L CC synth-2D]
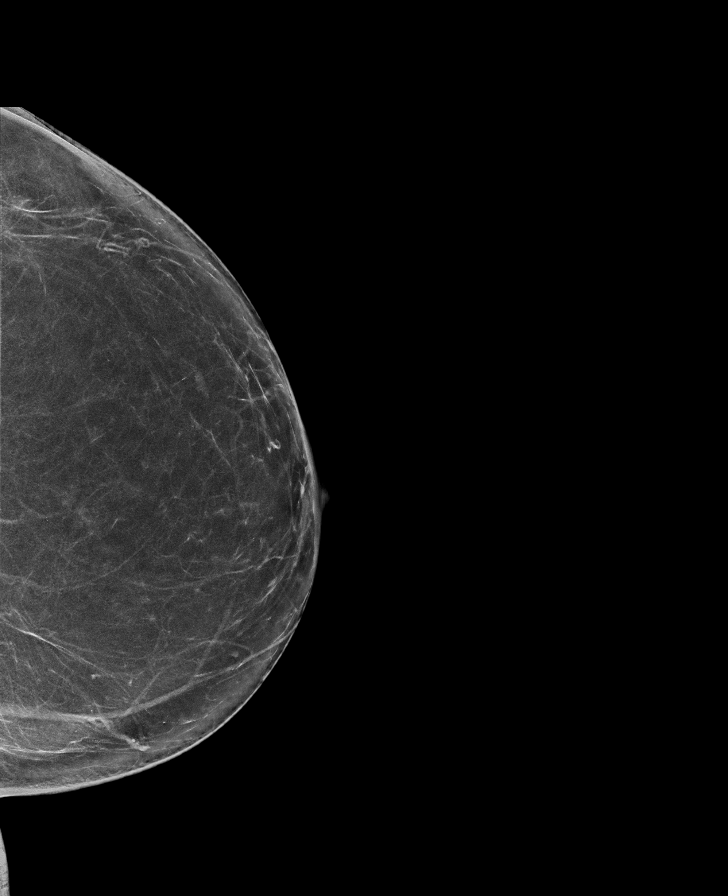

[L MLO synth-2D]
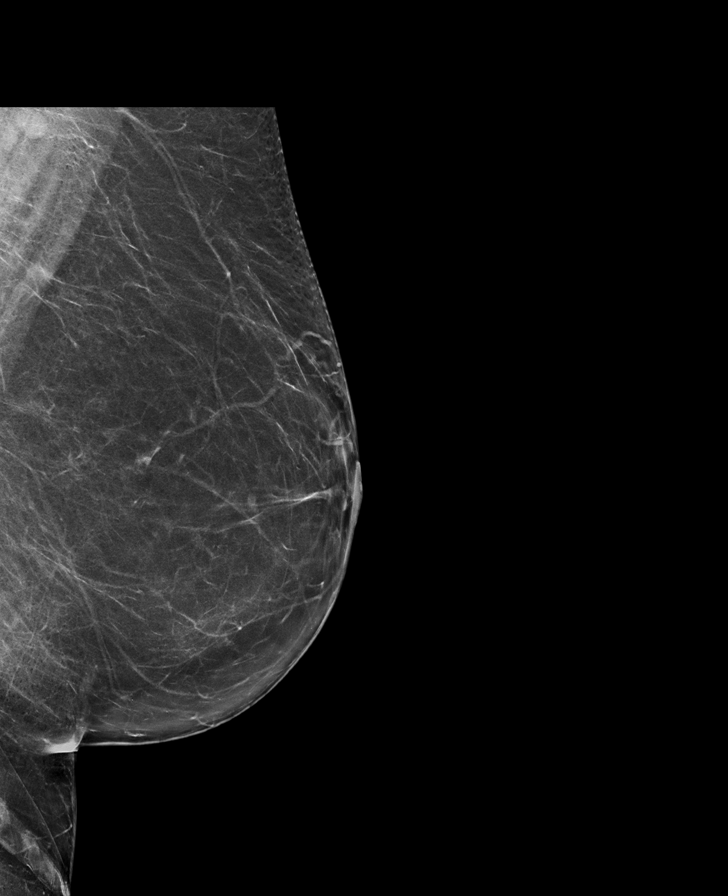

[R MLO synth-2D]
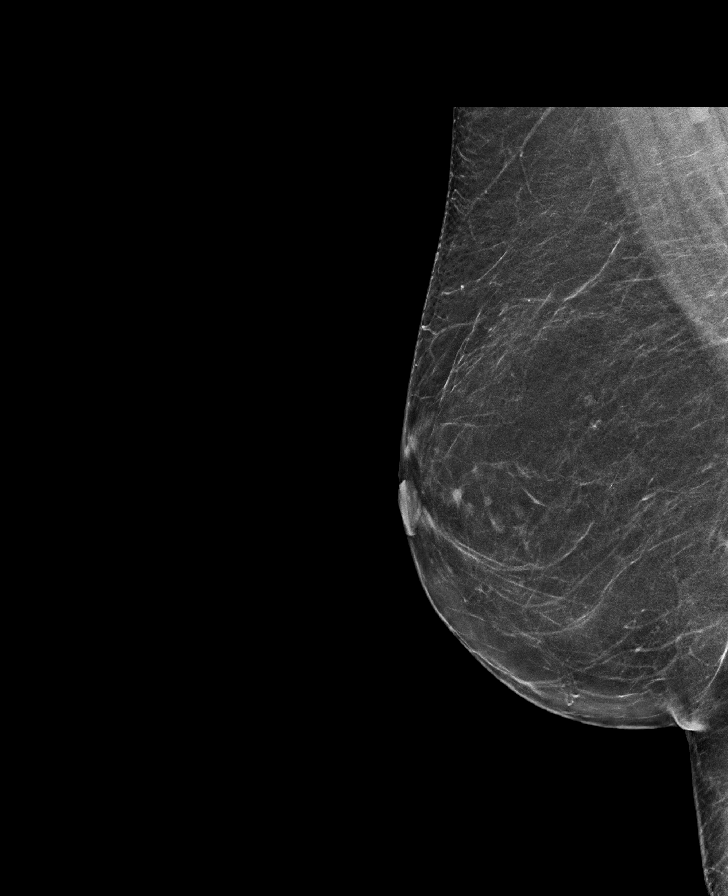

[R CC tomo · tomo slice 36/71.0]
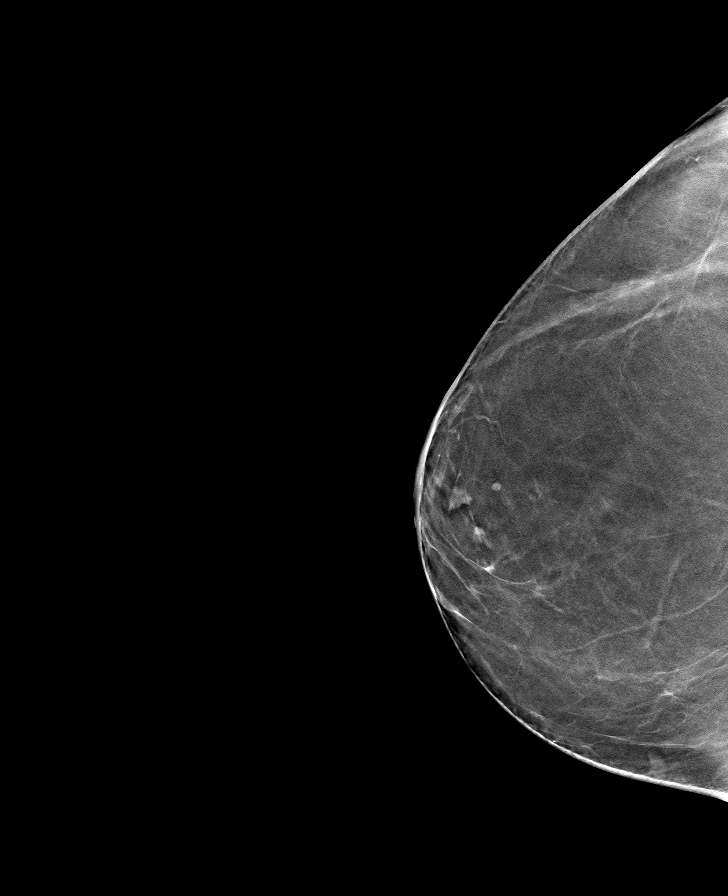

[R MLO tomo · tomo slice 35/70.0]
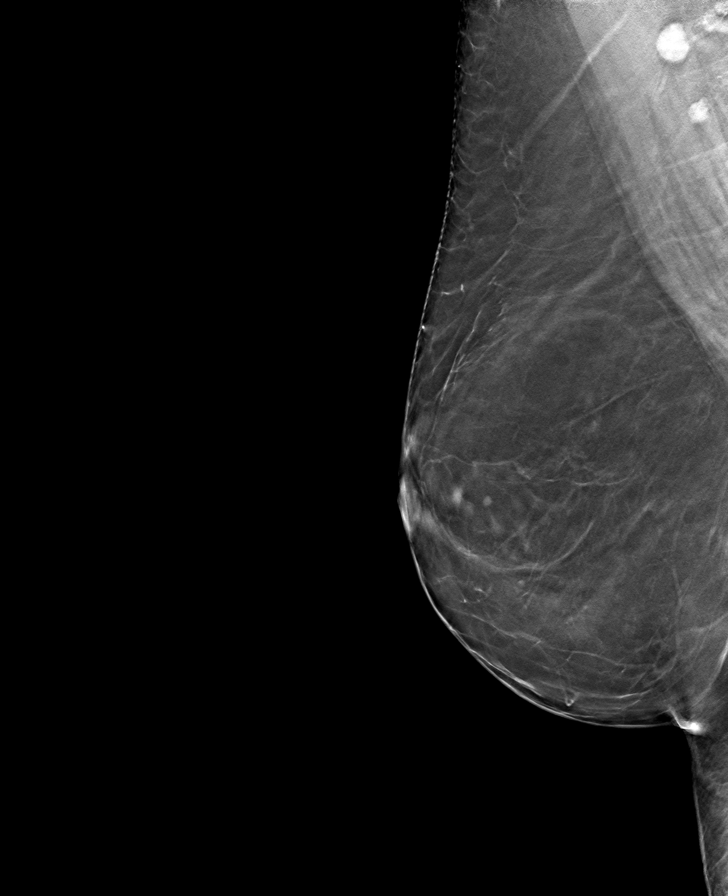

[L MLO tomo · tomo slice 37/74.0]
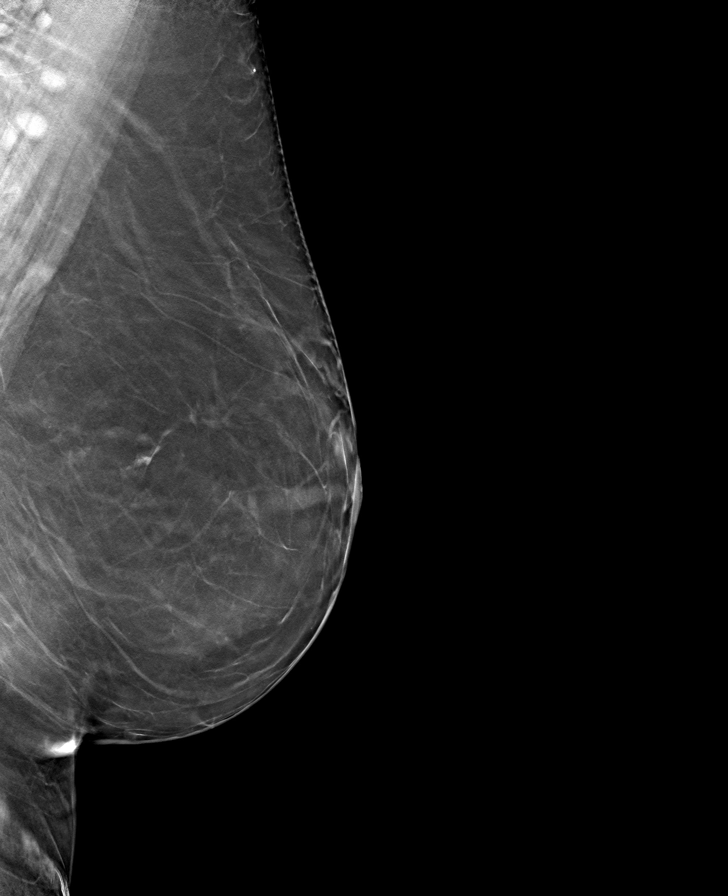

[L CC tomo · tomo slice 35/70.0]
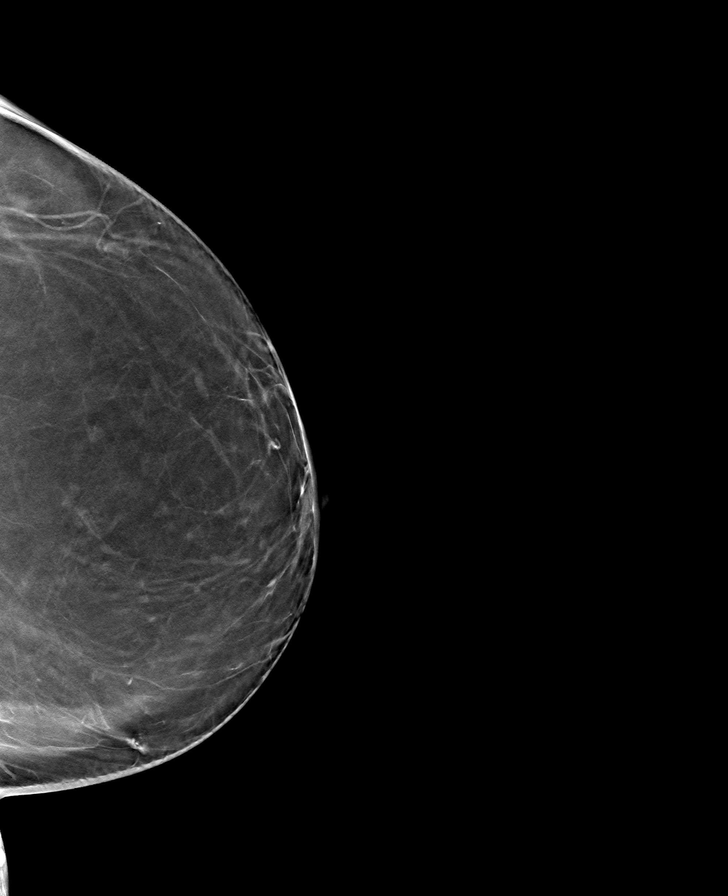

[8 of 24 positions shown; findings below may reference images not displayed]

FINDINGS: There are no findings suspicious for malignancy. Images were
processed with CAD.
IMPRESSION: No mammographic evidence of malignancy. A result letter of this
screening mammogram will be mailed directly to the patient.

RECOMMENDATION:
Screening mammogram in one year. (Code:[id number]-[id number])

BI-RADS CATEGORY  1: Negative.

## 2019-01-28 ENCOUNTER — Other Ambulatory Visit: Payer: Self-pay

## 2019-01-30 ENCOUNTER — Other Ambulatory Visit: Payer: Self-pay

## 2019-01-30 ENCOUNTER — Encounter: Payer: Self-pay | Admitting: Obstetrics & Gynecology

## 2019-01-30 ENCOUNTER — Ambulatory Visit (INDEPENDENT_AMBULATORY_CARE_PROVIDER_SITE_OTHER): Payer: No Typology Code available for payment source | Admitting: Obstetrics & Gynecology

## 2019-01-30 VITALS — BP 132/70 | HR 80 | Temp 97.5°F | Resp 10 | Ht 62.0 in | Wt 215.0 lb

## 2019-01-30 DIAGNOSIS — Z6839 Body mass index (BMI) 39.0-39.9, adult: Secondary | ICD-10-CM | POA: Diagnosis not present

## 2019-01-30 DIAGNOSIS — Z01419 Encounter for gynecological examination (general) (routine) without abnormal findings: Secondary | ICD-10-CM

## 2019-01-30 DIAGNOSIS — Z0000 Encounter for general adult medical examination without abnormal findings: Secondary | ICD-10-CM

## 2019-01-30 MED ORDER — PREMARIN 0.625 MG/GM VA CREA: TOPICAL_CREAM | VAGINAL | 2 refills | Status: DC

## 2019-01-30 MED FILL — PREMARIN VAGINAL CREAM-APPL: 0.625 | 90 days supply | Qty: 30 | Fill #0

## 2019-01-30 NOTE — Patient Instructions (Signed)
[email or web address]

## 2019-01-30 NOTE — Progress Notes (Signed)
59 y.o. G33P0020 Married White or Caucasian female here for annual exam.  Doing well.  Had second Covid vaccine this morning.  Denies vaginal bleeding.  All therapy is being done remotely.  Not using the vaginal estrogen cream regularly.     Frustrated with weight.  Has yo-yo dieted for years.  Did Healthy weight and wellness program for a year and never lost weight.  Was on Saxenda.  Has gained about 40 pounds with Covid.  Patient's last menstrual period was 01/01/2014 (approximate).          Sexually active: No.  The current method of family planning is post menopausal status.    Exercising: No.  The patient does not participate in regular exercise at present. Smoker:  no  Health Maintenance: Pap:  10/24/17 Neg: Neg HR HPV   08/06/14 Neg. HR HPV:neg - Care Everywhere History of abnormal Pap:  Yes, age 49 MMG:  10/10/17 BIRADS 1 negative/density a.  Pt is aware this is due. Colonoscopy:  07/07/18 f/u 3 years, 4 adenomatous polyps.  BMD:   11/18/15 Osteopenia, -1.2 TDaP:  UTD Pneumonia vaccine(s):  n/a Shingrix:   Discussed with pt today. Hep C testing: 10/19/16 Neg Screening Labs: PCP   reports that she has never smoked. She has never used smokeless tobacco. She reports that she does not drink alcohol or use drugs.  Past Medical History:  Diagnosis Date  . Abnormal Pap smear of cervix    age 89  . Abnormal uterine bleeding   . Allergy   . Chronic constipation   . Depression   . Endometriosis   . GERD (gastroesophageal reflux disease)   . Hemorrhoids   . Hyperlipidemia   . Obesity   . Osteopenia   . Pre-diabetes     Past Surgical History:  Procedure Laterality Date  . ABDOMINOPLASTY  2006   Dr. Lesli Albee  . BREAST REDUCTION SURGERY  2006  . COLONOSCOPY     normal- but hems in [place]- age 61   . [place] OF UTERUS  2016  . FRACTURE SURGERY  1972   Arm   . GYNECOLOGIC CRYOSURGERY     at age 30  . HYSTEROSCOPY  2006  . REDUCTION MAMMAPLASTY       Current Outpatient Medications  Medication Sig Dispense Refill  . Calcium Carbonate-Vitamin D3 (CALCIUM 600-D) 600-400 MG-UNIT TABS Takes 2 tablets daily.    . Cholecalciferol (VITAMIN D3) 25 MCG (1000 UT) CAPS Take by mouth daily.    [name] conjugated estrogens (PREMARIN) vaginal cream 1/2 gram vaginally twice weekly 30 g 2  . diazepam (VALIUM) 10 MG tablet once. With dental appt  0  . loratadine (CLARITIN) 10 MG tablet Take 10 mg by mouth daily.    . Multiple Vitamin (MULTIVITAMIN) tablet Take 1 tablet by mouth daily.    . Omega-3 Fatty Acids (FISH OIL) 1000 MG CAPS 2 capsules daily.    [name] omeprazole (PRILOSEC) 10 MG capsule Take 20 mg by mouth daily.     . Insulin Pen Needle (BD PEN NEEDLE NANO U/F) 32G X 4 MM MISC 1 each by Does not apply route daily. (Patient not taking: Reported on 01/30/2019) 50 each 0  . Insulin Pen Needle (UNIFINE PENTIPS) 31G X 6 MM MISC USE TO INJECT SAXENDA DAILY (Patient not taking: Reported on 01/30/2019) 100 each 0  . polyethylene glycol powder (GLYCOLAX/MIRALAX) powder Take 1 Container by mouth daily.    . Vitamin D, Ergocalciferol, (DRISDOL) 1.25 MG (50000  UT) CAPS capsule Take 1 capsule (50,000 Units total) by mouth every 7 (seven) days. (Patient not taking: Reported on 01/30/2019) 4 capsule 0  . Wheat Dextrin (BENEFIBER PO) Take by mouth daily.     No current facility-administered medications for this visit.    Family History  Problem Relation Age of Onset  . Hyperlipidemia Mother   . Stroke Mother   . Diabetes Mother   . Hypertension Mother   . Kidney disease Mother   . Thyroid disease Mother   . Eating disorder Mother   . Obesity Mother   . Arthritis Father   . Heart disease Father   . Sudden death Father   . Colon polyps Father   . Hyperlipidemia Maternal Grandmother   . Heart disease Maternal Grandmother   . Mental illness Maternal Grandmother   . Alcohol abuse Maternal Grandfather   . Arthritis Paternal Grandmother   . Hyperlipidemia  Paternal Grandmother   . Heart disease Paternal Grandmother   . Alcohol abuse Paternal Grandfather   . Colon polyps Sister   . Colon cancer Neg Hx   . Esophageal cancer Neg Hx   . Rectal cancer Neg Hx   . Stomach cancer Neg Hx     Review of Systems  All other systems reviewed and are negative.   Exam:   BP 132/70 (BP Location: Right Arm, Patient Position: Sitting, Cuff Size: Normal)   Pulse 80   Temp (!) 97.5 F (36.4 C) (Temporal)   Resp 10   Ht 5\' 2"  (1.575 m)   Wt 215 lb (97.5 kg)   LMP 01/01/2014 (Approximate)   BMI 39.32 kg/m    Height: 5\' 2"  (157.5 cm)  Ht Readings from Last 3 Encounters:  01/30/19 5\' 2"  (1.575 m)  07/07/18 5' 2.5" (1.588 m)  06/23/18 5' 2.5" (1.588 m)    General appearance: alert, cooperative and appears stated age Head: Normocephalic, without obvious abnormality, atraumatic Neck: no adenopathy, supple, symmetrical, trachea midline and thyroid normal to inspection and palpation Lungs: clear to auscultation bilaterally Breasts: normal appearance, no masses or tenderness Heart: regular rate and rhythm Abdomen: soft, non-tender; bowel sounds normal; no masses,  no organomegaly Extremities: extremities normal, atraumatic, no cyanosis or edema Skin: Skin color, texture, turgor normal. No rashes or lesions Lymph nodes: Cervical, supraclavicular, and axillary nodes normal. No abnormal inguinal nodes palpated Neurologic: Grossly normal   Pelvic: External genitalia:  no lesions              Urethra:  normal appearing urethra with no masses, tenderness or lesions              Bartholins and Skenes: normal                 Vagina: normal appearing vagina with normal color and discharge, no lesions              Cervix: no lesions              Pap taken: No. Bimanual Exam:  Uterus:  normal size, contour, position, consistency, mobility, non-tender              Adnexa: normal adnexa and no mass, fullness, tenderness               Rectovaginal: Confirms                Anus:  normal sphincter tone, no lesions  Chaperone, Terence Lux, CMA, was present for exam.  A:  Well  Woman with normal exam PMP, no HRT Vaginal atrophy GERD H/o GI ulcer BMD 39.3 H/o shingles  P:   Mammogram guidelines reviewed.  Knows this is due. pap smear with HR HPV 2019 was normal/negative.  Not indicated today. RF for premarin cream 1/2 gram pv twice weekly.  #30 gm/2RF Colonoscopy  UTD CBC, CMP, Lipids, TSH, Vit D, hb A1C obtained   BMD UTD as well D/w pt bariatric program at [facility] as well as information session Return annually or prn  In additional to gyn exam and gyn related concerns today, we discussed bariatric surgery, local resources, and I answered her questions to the best of my ability.  Additional 15 minutes spent discussing this today.  This is above and beyond scope of routine gyn exam.

## 2019-01-31 LAB — COMPREHENSIVE METABOLIC PANEL
ALT: 27 IU/L (ref 0–32)
AST: 19 IU/L (ref 0–40)
Albumin/Globulin Ratio: 1.9 (ref 1.2–2.2)
Albumin: 4.4 g/dL (ref 3.8–4.9)
Alkaline Phosphatase: 99 IU/L (ref 39–117)
BUN/Creatinine Ratio: 28 (ref 12–28)
BUN: 23 mg/dL (ref 8–27)
Bilirubin Total: 0.3 mg/dL (ref 0.0–1.2)
CO2: 23 mmol/L (ref 20–29)
Calcium: 9.8 mg/dL (ref 8.7–10.3)
Chloride: 103 mmol/L (ref 96–106)
Creatinine, Ser: 0.83 mg/dL (ref 0.57–1.00)
GFR calc Af Amer: 89 mL/min/{1.73_m2} (ref >59)
GFR calc non Af Amer: 77 mL/min/{1.73_m2} (ref >59)
Globulin, Total: 2.3 g/dL (ref 1.5–4.5)
Glucose: 113 mg/dL — ABNORMAL HIGH (ref 65–99)
Potassium: 4.4 mmol/L (ref 3.5–5.2)
Sodium: 141 mmol/L (ref 134–144)
Total Protein: 6.7 g/dL (ref 6.0–8.5)

## 2019-01-31 LAB — CBC
Hematocrit: 46 % (ref 34.0–46.6)
Hemoglobin: 15.2 g/dL (ref 11.1–15.9)
MCH: 29.8 pg (ref 26.6–33.0)
MCHC: 33 g/dL (ref 31.5–35.7)
MCV: 90 fL (ref 79–97)
Platelets: 255 10*3/uL (ref 150–450)
RBC: 5.1 x10E6/uL (ref 3.77–5.28)
RDW: 12.7 % (ref 11.7–15.4)
WBC: 7.9 10*3/uL (ref 3.4–10.8)

## 2019-01-31 LAB — HEMOGLOBIN A1C
Est. average glucose Bld gHb Est-mCnc: 131 mg/dL
Hgb A1c MFr Bld: 6.2 % — ABNORMAL HIGH (ref 4.8–5.6)

## 2019-01-31 LAB — LIPID PANEL
Chol/HDL Ratio: 3.8 ratio (ref 0.0–4.4)
Cholesterol, Total: 226 mg/dL — ABNORMAL HIGH (ref 100–199)
HDL: 60 mg/dL (ref >39)
LDL Chol Calc (NIH): 135 mg/dL — ABNORMAL HIGH (ref 0–99)
Triglycerides: 176 mg/dL — ABNORMAL HIGH (ref 0–149)
VLDL Cholesterol Cal: 31 mg/dL (ref 5–40)

## 2019-01-31 LAB — VITAMIN D 25 HYDROXY (VIT D DEFICIENCY, FRACTURES): Vit D, 25-Hydroxy: 42.5 ng/mL (ref 30.0–100.0)

## 2019-01-31 LAB — TSH: TSH: 3.63 u[IU]/mL (ref 0.450–4.500)

## 2019-02-02 ENCOUNTER — Telehealth: Payer: Self-pay

## 2019-02-02 NOTE — Telephone Encounter (Signed)
-----   Message from Megan Salon, MD sent at 02/02/2019  6:59 AM EST ----- Pt has seen results.  She needs to follow up with PCP as she now has prediabetes and treatment is usually started for this to prevent progression to diabetes.  Her triglycerides were elevated as well but this is due to the prediabetes.  This may help in her decision about bariatric surgery as well.

## 2019-02-02 NOTE — Telephone Encounter (Signed)
Tried calling patient, no answer. Left message for patient to call me back. 

## 2019-02-05 NOTE — Telephone Encounter (Signed)
Tried calling patient back. No answer, left message for patient to call me back. 

## 2019-02-05 NOTE — Telephone Encounter (Signed)
Patient returning call to [facility].

## 2019-02-06 NOTE — Telephone Encounter (Signed)
See result note 2/2/421. Closing encounter.

## 2019-02-09 DIAGNOSIS — Z6831 Body mass index (BMI) 31.0-31.9, adult: Secondary | ICD-10-CM

## 2019-02-09 DIAGNOSIS — E669 Obesity, unspecified: Secondary | ICD-10-CM

## 2019-02-09 DIAGNOSIS — E559 Vitamin D deficiency, unspecified: Secondary | ICD-10-CM

## 2019-02-09 DIAGNOSIS — E8881 Metabolic syndrome: Secondary | ICD-10-CM

## 2019-02-09 DIAGNOSIS — Z9189 Other specified personal risk factors, not elsewhere classified: Secondary | ICD-10-CM

## 2019-02-18 ENCOUNTER — Encounter: Payer: Self-pay | Admitting: Obstetrics & Gynecology

## 2019-02-19 ENCOUNTER — Encounter: Payer: Self-pay | Admitting: Obstetrics & Gynecology

## 2019-02-19 NOTE — Progress Notes (Signed)
Letter written for pt in support of bariatric program.

## 2019-02-20 ENCOUNTER — Encounter: Payer: Self-pay | Admitting: Obstetrics & Gynecology

## 2019-04-17 ENCOUNTER — Other Ambulatory Visit: Payer: Self-pay

## 2019-04-17 ENCOUNTER — Ambulatory Visit (INDEPENDENT_AMBULATORY_CARE_PROVIDER_SITE_OTHER): Payer: No Typology Code available for payment source | Admitting: Family Medicine

## 2019-04-17 ENCOUNTER — Encounter: Payer: Self-pay | Admitting: Family Medicine

## 2019-04-17 VITALS — BP 126/74 | HR 98 | Temp 97.4°F | Ht 62.0 in | Wt 216.0 lb

## 2019-04-17 DIAGNOSIS — S161XXA Strain of muscle, fascia and tendon at neck level, initial encounter: Secondary | ICD-10-CM

## 2019-04-17 MED ORDER — CYCLOBENZAPRINE HCL 10 MG PO TABS: 5.0000 mg | ORAL_TABLET | Freq: Every evening | ORAL | 0 refills | Status: DC | PRN

## 2019-04-17 MED FILL — CYCLOBENZAPRINE HCL 10 MG T: 10 | 20 days supply | Qty: 20 | Fill #0

## 2019-04-17 NOTE — Progress Notes (Signed)
   Subjective:    Patient ID: Tina Chavez, female    DOB: [birth year], 59 y.o.   MRN: [id number]  HPI Chief Complaint  Patient presents with  . Arm Pain    Right arm pain - radiates from shoulder to fingertips, x2 weeks, nerve pain?   This is a 59 yo female who presents today for above cc. She had been moving recently, packing and moving boxes. Neck is stiff. Waking up with numbness in right arm, fingers. Initially, no improvement with changing position. Improvement with hot shower. Last night improvement with sitting up.  Some improvement with ibuprofen 400 or 800 mg. No discomfort during the day, only at night. Sleeps on side.   Review of Systems Per HPI    Objective:   Physical Exam Vitals reviewed.  Constitutional:      General: She is not in acute distress.    Appearance: Normal appearance. She is not ill-appearing or toxic-appearing.  HENT:     Head: Normocephalic and atraumatic.  Eyes:     Conjunctiva/sclera: Conjunctivae normal.  Cardiovascular:     Rate and Rhythm: Normal rate and regular rhythm.     Heart sounds: Normal heart sounds.  Pulmonary:     Effort: Pulmonary effort is normal.     Breath sounds: Normal breath sounds.  Musculoskeletal:     Comments: Neck, bilateral shoulders with normal ROM. Some tenderness over right shoulder, bilateral trapezius muscles. UE strength 5/5.   Skin:    General: Skin is warm and dry.  Neurological:     Mental Status: She is alert and oriented to person, place, and time.       BP 126/74 (BP Location: Left Arm, Patient Position: Sitting, Cuff Size: Normal)   Pulse 98   Temp (!) 97.4 F (36.3 C) (Temporal)   Ht 5\' 2"  (1.575 m)   Wt 216 lb (98 kg)   LMP 01/01/2014 (Approximate)   SpO2 98%   BMI 39.51 kg/m  Wt Readings from Last 3 Encounters:  04/17/19 216 lb (98 kg)  01/30/19 215 lb (97.5 kg)  07/07/18 190 lb (86.2 kg)       Assessment & Plan:  1. Strain of neck muscle, initial encounter - continue otc NSAIDs,  heat, position change for comfort, recommended daily stretching, try moving computer monitor to eye level and / or standing desk - follow up if no improvement with treatment or if worsening symptoms - cyclobenzaprine (FLEXERIL) 10 MG tablet; Take 0.5-1 tablets (5-10 mg total) by mouth at bedtime as needed for muscle spasms.  Dispense: 20 tablet; Refill: 0  This visit occurred during the SARS-CoV-2 public health emergency.  Safety protocols were in place, including screening questions prior to the visit, additional usage of staff PPE, and extensive cleaning of exam room while observing appropriate contact time as indicated for disinfecting solutions.    Clarene Reamer, FNP-BC  [facility], [facility]  04/17/2019 1:38 PM

## 2019-04-17 NOTE — Progress Notes (Signed)
Subjective:    Patient ID: Tina Chavez, female    DOB: [birth year], 58 y.o.   MRN: [id number]  HPI  Chief Complaint  Patient presents with  . Arm Pain    Right arm pain - radiates from shoulder to fingertips, x2 weeks, nerve pain?    Review of Systems  Constitutional: Negative.   HENT: Negative.   Eyes: Negative.   Respiratory: Negative.   Cardiovascular: Negative.   Gastrointestinal: Negative.   Genitourinary: Negative.   Musculoskeletal: Positive for myalgias and neck pain.       Tingling and numbness to the right arm when sleeping on the right side. Patient is contributing this to the recent moving and pulling many heavy moving boxes.   Skin: Negative.   Neurological: Negative.   Endo/Heme/Allergies: Negative.   Psychiatric/Behavioral: Negative.       Review of Systems  Constitutional: Negative.   HENT: Negative.   Eyes: Negative.   Respiratory: Negative.   Cardiovascular: Negative.   Gastrointestinal: Negative.   Genitourinary: Negative.   Musculoskeletal: Positive for myalgias and neck pain.       Tingling and numbness to the right arm when sleeping on the right side. Patient is contributing this to the recent moving and pulling many heavy moving boxes.   Skin: Negative.   Neurological: Negative.   Psychiatric/Behavioral: Negative.        Objective:   Physical Exam HENT:     Head: Normocephalic and atraumatic.  Eyes:     Extraocular Movements: EOM normal.     Conjunctiva/sclera: Conjunctivae normal.     Pupils: Pupils are equal, round, and reactive to light.  Cardiovascular:     Rate and Rhythm: Normal rate and regular rhythm.     Heart sounds: Normal heart sounds.  Pulmonary:     Effort: Pulmonary effort is normal.     Breath sounds: Normal breath sounds.  Musculoskeletal:        General: Tenderness present.     Comments: Stiffness and tenderness to bilateral trapezius muscles. Full ROM.   Skin:    General: Skin is warm and dry.  Neurological:       Mental Status: She is alert and oriented to person, place, and time.     GCS: GCS score is 15.  Psychiatric:        Mood and Affect: Mood and affect normal.        Cognition and Memory: Memory normal.        Judgment: Judgment normal.     BP 126/74 (BP Location: Left Arm, Patient Position: Sitting, Cuff Size: Normal)   Pulse 98   Temp (!) 97.4 F (36.3 C) (Temporal)   Ht 5\' 2"  (1.575 m)   Wt 216 lb (98 kg)   LMP 01/01/2014 (Approximate)   SpO2 98%   BMI 39.51 kg/m   Physical Exam  Constitutional: She is oriented to person, place, and time and well-developed, well-nourished, and in no distress.  HENT:  Head: Normocephalic and atraumatic.  Eyes: Pupils are equal, round, and reactive to light. Conjunctivae and EOM are normal.  Cardiovascular: Normal rate, regular rhythm and normal heart sounds.  Pulmonary/Chest: Effort normal and breath sounds normal.  Musculoskeletal:        General: Tenderness present.     Comments: Stiffness and tenderness to bilateral trapezius muscles. Full ROM.   Neurological: She is alert and oriented to person, place, and time. GCS score is 15.  Skin: Skin is warm and dry.  Psychiatric:  Mood, memory, affect and judgment normal.          Assessment & Plan:  1. Musculoskeletal pain  We will prescribe Cyclobenzaprine (Flexeril)5 mg qHS. Please continue to take Ibuprofen 600 mg every 6 hours. Alternate warm and cold compresses to your neck. Soft stretching exercises.

## 2019-04-17 NOTE — Patient Instructions (Signed)
Good to see you today  I have sent in a muscle relaxer for bedtime, can take ibuprofen 600 mg at bedtime  Do gentle range of motion with neck, shoulders several times a day  Let me know if any worse or change of symptoms

## 2020-02-11 ENCOUNTER — Ambulatory Visit (HOSPITAL_BASED_OUTPATIENT_CLINIC_OR_DEPARTMENT_OTHER): Payer: No Typology Code available for payment source | Admitting: Obstetrics & Gynecology

## 2020-04-05 ENCOUNTER — Ambulatory Visit (INDEPENDENT_AMBULATORY_CARE_PROVIDER_SITE_OTHER): Payer: No Typology Code available for payment source | Admitting: Obstetrics & Gynecology

## 2020-04-05 ENCOUNTER — Other Ambulatory Visit (HOSPITAL_BASED_OUTPATIENT_CLINIC_OR_DEPARTMENT_OTHER): Payer: Self-pay

## 2020-04-05 ENCOUNTER — Other Ambulatory Visit: Payer: Self-pay

## 2020-04-05 ENCOUNTER — Encounter (HOSPITAL_BASED_OUTPATIENT_CLINIC_OR_DEPARTMENT_OTHER): Payer: Self-pay | Admitting: Obstetrics & Gynecology

## 2020-04-05 VITALS — BP 125/71 | HR 66 | Ht 62.25 in | Wt 193.0 lb

## 2020-04-05 DIAGNOSIS — M8580 Other specified disorders of bone density and structure, unspecified site: Secondary | ICD-10-CM

## 2020-04-05 DIAGNOSIS — N9089 Other specified noninflammatory disorders of vulva and perineum: Secondary | ICD-10-CM | POA: Diagnosis not present

## 2020-04-05 DIAGNOSIS — Z1231 Encounter for screening mammogram for malignant neoplasm of breast: Secondary | ICD-10-CM | POA: Diagnosis not present

## 2020-04-05 DIAGNOSIS — Z8639 Personal history of other endocrine, nutritional and metabolic disease: Secondary | ICD-10-CM

## 2020-04-05 MED ORDER — TRIAMCINOLONE ACETONIDE 0.5 % EX OINT
1.0000 "application " | TOPICAL_OINTMENT | Freq: Two times a day (BID) | CUTANEOUS | 1 refills | Status: DC
  Filled 2020-04-05: qty 15, 7d supply, fill #0

## 2020-04-05 NOTE — Progress Notes (Signed)
60 y.o. G46P0020 Married White or Caucasian female here for annual exam.  Doing well.  Did go to Bariatric Surgery consult.  Did not like the surgeon.  Doing Optivia.  Has lost 26lbs.  DId do healthy weight and wellness in the past.  Used Saxenda but didn't lose any weight.    Denies vaginal bleeding.  Has been having some external itching.    Patient's last menstrual period was 01/01/2014 (approximate).          Sexually active: Yes.    The current method of family planning is post menopausal status.    Exercising: No.   Smoker:  no  Health Maintenance: Pap:  10/2017 new with neg HR HPV History of abnormal Pap:  Yes, age 85 MMG:  2019 Colonoscopy:  07/2018, adenomatous polyps, follow up 3 years BMD:   2017, t score -1.2 TDaP:  Unsure of date Pneumonia vaccine(s):  n/a Shingrix:   Has not completed Hep C testing: 10/2016 Screening Labs: will return for fasting lab work   reports that she has never smoked. She has never used smokeless tobacco. She reports that she does not drink alcohol and does not use drugs.  Past Medical History:  Diagnosis Date  . Abnormal Pap smear of cervix    age 40  . Allergy   . Chronic constipation   . Depression   . Endometriosis   . GERD (gastroesophageal reflux disease)   . Hemorrhoids   . Hyperlipidemia   . Obesity   . Osteopenia   . Pre-diabetes     Past Surgical History:  Procedure Laterality Date  . ABDOMINOPLASTY  2006   Dr. Lesli Albee  . BREAST REDUCTION SURGERY  2006  . COLONOSCOPY     normal- but hems in [place]- age 29   . [place] OF UTERUS  2016  . FRACTURE SURGERY  1972   Arm   . GYNECOLOGIC CRYOSURGERY     at age 49  . HYSTEROSCOPY  2006  . REDUCTION MAMMAPLASTY      Current Outpatient Medications  Medication Sig Dispense Refill  . Calcium Carbonate-Vitamin D3 600-400 MG-UNIT TABS Takes 2 tablets daily.    . Cholecalciferol (VITAMIN D3) 25 MCG (1000 UT) CAPS Take by mouth daily.    [name] conjugated  estrogens (PREMARIN) vaginal cream 1/2 gram vaginally twice weekly 30 g 2  . loratadine (CLARITIN) 10 MG tablet Take 10 mg by mouth daily.    . Multiple Vitamin (MULTIVITAMIN) tablet Take 1 tablet by mouth daily.    . Omega-3 Fatty Acids (FISH OIL) 1000 MG CAPS 2 capsules daily.    [name] omeprazole (PRILOSEC) 10 MG capsule Take 20 mg by mouth daily.     . cyclobenzaprine (FLEXERIL) 10 MG tablet Take 0.5-1 tablets (5-10 mg total) by mouth at bedtime as needed for muscle spasms. (Patient not taking: Reported on 04/05/2020) 20 tablet 0  . diazepam (VALIUM) 10 MG tablet once. With dental appt (Patient not taking: Reported on 04/05/2020)  0  . polyethylene glycol powder (GLYCOLAX/MIRALAX) powder Take 1 Container by mouth daily. (Patient not taking: Reported on 04/05/2020)    . Vitamin D, Ergocalciferol, (DRISDOL) 1.25 MG (50000 UT) CAPS capsule Take 1 capsule (50,000 Units total) by mouth every 7 (seven) days. (Patient not taking: Reported on 04/05/2020) 4 capsule 0  . Wheat Dextrin (BENEFIBER PO) Take by mouth daily. (Patient not taking: Reported on 04/05/2020)     No current facility-administered medications for this visit.  Family History  Problem Relation Age of Onset  . Hyperlipidemia Mother   . Stroke Mother   . Diabetes Mother   . Hypertension Mother   . Kidney disease Mother   . Thyroid disease Mother   . Eating disorder Mother   . Obesity Mother   . Arthritis Father   . Heart disease Father   . Sudden death Father   . Colon polyps Father   . Hyperlipidemia Maternal Grandmother   . Heart disease Maternal Grandmother   . Mental illness Maternal Grandmother   . Alcohol abuse Maternal Grandfather   . Arthritis Paternal Grandmother   . Hyperlipidemia Paternal Grandmother   . Heart disease Paternal Grandmother   . Alcohol abuse Paternal Grandfather   . Colon polyps Sister   . Colon cancer Neg Hx   . Esophageal cancer Neg Hx   . Rectal cancer Neg Hx   . Stomach cancer Neg Hx     Review of  Systems  All other systems reviewed and are negative.   Exam:   BP 125/71   Pulse 66   Ht 5' 2.25" (1.581 m)   Wt 193 lb (87.5 kg)   LMP 01/01/2014 (Approximate)   BMI 35.02 kg/m   Height: 5' 2.25" (158.1 cm)  General appearance: alert, cooperative and appears stated age Head: Normocephalic, without obvious abnormality, atraumatic Neck: no adenopathy, supple, symmetrical, trachea midline and thyroid normal to inspection and palpation Lungs: clear to auscultation bilaterally Breasts: normal appearance, no masses or tenderness Heart: regular rate and rhythm Abdomen: soft, non-tender; bowel sounds normal; no masses,  no organomegaly Extremities: extremities normal, atraumatic, no cyanosis or edema Skin: Skin color, texture, turgor normal. No rashes or lesions Lymph nodes: Cervical, supraclavicular, and axillary nodes normal. No abnormal inguinal nodes palpated Neurologic: Grossly normal   Pelvic: External genitalia:  Pigmented lesion on left inferior vulva present, feels like this is enlarged compared to last year              Urethra:  normal appearing urethra with no masses, tenderness or lesions              Bartholins and Skenes: normal                 Vagina: normal appearing vagina with normal color and discharge, no lesions              Cervix: no lesions              Pap taken: No. Bimanual Exam:  Uterus:  normal size, contour, position, consistency, mobility, non-tender              Adnexa: normal adnexa and no mass, fullness, tenderness               Rectovaginal: Confirms               Anus:  normal sphincter tone, no lesions  Chaperone, Prince Rome, CMA, was present for exam.  Assessment/Plan: 1. Encounter for screening mammogram for malignant neoplasm of breast - pap 10/2017 neg with neg HR HPV.  Will repeat next year. - MM 3D SCREEN BREAST BILATERAL; Future - Colonoscopy 07/2018.  Pt aware due next year. - BMD order placed - vaccines updates - lab work  obtained for future fasting testing:  Lipids, CMP.  Will schedule lab date for pt.  2. Osteopenia, unspecified location - DG BONE DENSITY (DXA); Future  3. H/O elevated lipids - Comprehensive metabolic panel; Future - Lipid  panel; Future  4. Vulvar lesion - due to changes, have recommended she return for removal.

## 2020-04-06 DIAGNOSIS — Z8639 Personal history of other endocrine, nutritional and metabolic disease: Secondary | ICD-10-CM | POA: Insufficient documentation

## 2020-04-06 DIAGNOSIS — M8580 Other specified disorders of bone density and structure, unspecified site: Secondary | ICD-10-CM | POA: Insufficient documentation

## 2020-04-06 DIAGNOSIS — E785 Hyperlipidemia, unspecified: Secondary | ICD-10-CM | POA: Insufficient documentation

## 2020-04-06 DIAGNOSIS — N9089 Other specified noninflammatory disorders of vulva and perineum: Secondary | ICD-10-CM | POA: Insufficient documentation

## 2020-04-27 ENCOUNTER — Ambulatory Visit (HOSPITAL_BASED_OUTPATIENT_CLINIC_OR_DEPARTMENT_OTHER)
Admission: RE | Admit: 2020-04-27 | Discharge: 2020-04-27 | Disposition: A | Discharge status: Home / Self Care | Payer: No Typology Code available for payment source | Source: Ambulatory Visit | Attending: Obstetrics & Gynecology | Admitting: Obstetrics & Gynecology

## 2020-04-27 ENCOUNTER — Other Ambulatory Visit: Payer: Self-pay

## 2020-04-27 ENCOUNTER — Other Ambulatory Visit (HOSPITAL_BASED_OUTPATIENT_CLINIC_OR_DEPARTMENT_OTHER)
Admission: RE | Admit: 2020-04-27 | Discharge: 2020-04-27 | Disposition: A | Discharge status: Home / Self Care | Payer: No Typology Code available for payment source | Source: Ambulatory Visit | Attending: Obstetrics & Gynecology | Admitting: Obstetrics & Gynecology

## 2020-04-27 ENCOUNTER — Ambulatory Visit (INDEPENDENT_AMBULATORY_CARE_PROVIDER_SITE_OTHER): Payer: No Typology Code available for payment source | Admitting: Obstetrics & Gynecology

## 2020-04-27 ENCOUNTER — Other Ambulatory Visit (HOSPITAL_BASED_OUTPATIENT_CLINIC_OR_DEPARTMENT_OTHER): Payer: Self-pay

## 2020-04-27 ENCOUNTER — Encounter (HOSPITAL_BASED_OUTPATIENT_CLINIC_OR_DEPARTMENT_OTHER): Payer: Self-pay | Admitting: Obstetrics & Gynecology

## 2020-04-27 VITALS — BP 116/83 | HR 64 | Wt 192.0 lb

## 2020-04-27 DIAGNOSIS — Z8639 Personal history of other endocrine, nutritional and metabolic disease: Secondary | ICD-10-CM | POA: Insufficient documentation

## 2020-04-27 DIAGNOSIS — Z1231 Encounter for screening mammogram for malignant neoplasm of breast: Secondary | ICD-10-CM | POA: Diagnosis present

## 2020-04-27 DIAGNOSIS — M8580 Other specified disorders of bone density and structure, unspecified site: Secondary | ICD-10-CM | POA: Insufficient documentation

## 2020-04-27 DIAGNOSIS — C519 Malignant neoplasm of vulva, unspecified: Secondary | ICD-10-CM | POA: Diagnosis not present

## 2020-04-27 DIAGNOSIS — N9089 Other specified noninflammatory disorders of vulva and perineum: Secondary | ICD-10-CM

## 2020-04-27 LAB — LIPID PANEL
Cholesterol: 210 mg/dL — ABNORMAL HIGH (ref 0–200)
HDL: 51 mg/dL (ref >40)
LDL Cholesterol: 135 mg/dL — ABNORMAL HIGH (ref 0–99)
Total CHOL/HDL Ratio: 4.1 RATIO
Triglycerides: 118 mg/dL (ref <150)
VLDL: 24 mg/dL (ref 0–40)

## 2020-04-27 LAB — COMPREHENSIVE METABOLIC PANEL
ALT: 21 U/L (ref 0–44)
AST: 16 U/L (ref 15–41)
Albumin: 4.5 g/dL (ref 3.5–5.0)
Alkaline Phosphatase: 82 U/L (ref 38–126)
Anion gap: 9 (ref 5–15)
BUN: 31 mg/dL — ABNORMAL HIGH (ref 8–23)
CO2: 28 mmol/L (ref 22–32)
Calcium: 9.4 mg/dL (ref 8.9–10.3)
Chloride: 104 mmol/L (ref 98–111)
Creatinine, Ser: 0.73 mg/dL (ref 0.44–1.00)
GFR, Estimated: >60 mL/min (ref >60)
Glucose, Bld: 130 mg/dL — ABNORMAL HIGH (ref 70–99)
Potassium: 4 mmol/L (ref 3.5–5.1)
Sodium: 141 mmol/L (ref 135–145)
Total Bilirubin: 0.6 mg/dL (ref 0.3–1.2)
Total Protein: 6.9 g/dL (ref 6.5–8.1)

## 2020-04-27 MED ORDER — HYDROCODONE-ACETAMINOPHEN 5-325 MG PO TABS: 1.0000 | ORAL_TABLET | Freq: Four times a day (QID) | ORAL | 0 refills | Status: DC | PRN

## 2020-04-27 MED ORDER — CEPHALEXIN 500 MG PO CAPS
500.0000 mg | ORAL_CAPSULE | Freq: Three times a day (TID) | ORAL | 0 refills | Status: DC
  Filled 2020-04-27: qty 21, 7d supply, fill #0

## 2020-04-27 MED ORDER — FLUCONAZOLE 150 MG PO TABS
150.0000 mg | ORAL_TABLET | Freq: Once | ORAL | 0 refills | Status: AC
  Filled 2020-04-27: qty 2, 3d supply, fill #0

## 2020-04-27 NOTE — Patient Instructions (Signed)
  Skin Biopsy, Care After This sheet gives you information about how to care for yourself after your procedure. Your health care provider may also give you more specific instructions. If you have problems or questions, contact your health care provider. What can I expect after the procedure? After the procedure, it is common to have:  Soreness.  Bruising.  Itching. Follow these instructions at home: Biopsy site care Follow instructions from your health care provider about how to take care of your biopsy site. Make sure you:  Wash your hands with soap and water before and after you change your bandage (dressing). If soap and water are not available, use hand sanitizer.  Apply ointment on your biopsy site as directed by your health care provider.  Change your dressing as told by your health care provider.  Leave stitches (sutures), skin glue, or adhesive strips in place. These skin closures may need to stay in place for 2 weeks or longer. If adhesive strip edges start to loosen and curl up, you may trim the loose edges. Do not remove adhesive strips completely unless your health care provider tells you to do that.  If the biopsy area bleeds, apply gentle pressure for 10 minutes. Check your biopsy site every day for signs of infection. Check for:  Redness, swelling, or pain.  Fluid or blood.  Warmth.  Pus or a bad smell.   General instructions  Rest and then return to your normal activities as told by your health care provider.  Take over-the-counter and prescription medicines only as told by your health care provider.  Keep all follow-up visits as told by your health care provider. This is important. Contact a health care provider if:  You have redness, swelling, or pain around your biopsy site.  You have fluid or blood coming from your biopsy site.  Your biopsy site feels warm to the touch.  You have pus or a bad smell coming from your biopsy site.  You have a  fever.  Your sutures, skin glue, or adhesive strips loosen or come off sooner than expected. Get help right away if:  You have bleeding that does not stop with pressure or a dressing. Summary  After the procedure, it is common to have soreness, bruising, and itching at the site.  Follow instructions from your health care provider about how to take care of your biopsy site.  Check your biopsy site every day for signs of infection.  Contact a health care provider if you have redness, swelling, or pain around your biopsy site, or your biopsy site feels warm to the touch.  Keep all follow-up visits as told by your health care provider. This is important. This information is not intended to replace advice given to you by your health care provider. Make sure you discuss any questions you have with your health care provider. Document Revised: 06/17/2017 Document Reviewed: 06/17/2017 Elsevier Patient Education  2021 Elsevier Inc.  

## 2020-04-28 ENCOUNTER — Ambulatory Visit: Payer: No Typology Code available for payment source

## 2020-04-28 ENCOUNTER — Other Ambulatory Visit: Payer: Self-pay | Admitting: Obstetrics & Gynecology

## 2020-04-28 DIAGNOSIS — R928 Other abnormal and inconclusive findings on diagnostic imaging of breast: Secondary | ICD-10-CM

## 2020-04-29 ENCOUNTER — Encounter (HOSPITAL_BASED_OUTPATIENT_CLINIC_OR_DEPARTMENT_OTHER): Payer: Self-pay

## 2020-04-29 LAB — SURGICAL PATHOLOGY

## 2020-05-01 DIAGNOSIS — C801 Malignant (primary) neoplasm, unspecified: Secondary | ICD-10-CM

## 2020-05-01 HISTORY — DX: Malignant (primary) neoplasm, unspecified: C80.1

## 2020-05-03 ENCOUNTER — Telehealth: Payer: Self-pay | Admitting: *Deleted

## 2020-05-03 NOTE — Progress Notes (Signed)
GYNECOLOGY  VISIT  CC:   Excision of vulvar lesion  HPI: 60 y.o. G35P0020 Married White or Caucasian female here for excision of vulvar lesion that appears to have changed in the last year.  There is a dark coloration in it.  Excision recommended at last visit and pt is here for this today.  Consent obtained.  Risks, benefits reviewed.  Questions answered.  Denies any vulvar complaints.  GYNECOLOGIC HISTORY: Patient's last menstrual period was 01/01/2014 (approximate). Contraception: PMP Menopausal hormone therapy: none  Patient Active Problem List   Diagnosis Date Noted  . Osteopenia 04/06/2020  . H/O elevated lipids 04/06/2020  . Vulvar lesion 04/06/2020  . Vitamin D deficiency 06/05/2017  . Prediabetes 06/05/2017  . GERD (gastroesophageal reflux disease) 08/13/2016  . Obesity (BMI 30.0-34.9) 08/13/2016    Past Medical History:  Diagnosis Date  . Abnormal Pap smear of cervix    age 44  . Allergy   . Chronic constipation   . Depression   . Endometriosis   . GERD (gastroesophageal reflux disease)   . Hemorrhoids   . Hyperlipidemia   . Obesity   . Osteopenia   . Pre-diabetes     Past Surgical History:  Procedure Laterality Date  . ABDOMINOPLASTY  2006   Dr. Lesli Albee  . BREAST REDUCTION SURGERY  2006  . COLONOSCOPY     normal- but hems in [place]- age 25   . [place] OF UTERUS  2016  . FRACTURE SURGERY  1972   Arm   . GYNECOLOGIC CRYOSURGERY     at age 89  . HYSTEROSCOPY  2006  . REDUCTION MAMMAPLASTY      MEDS:   Current Outpatient Medications on File Prior to Visit  Medication Sig Dispense Refill  . Calcium Carbonate-Vitamin D3 600-400 MG-UNIT TABS Takes 2 tablets daily.    . Cholecalciferol (VITAMIN D3) 25 MCG (1000 UT) CAPS Take by mouth daily.    [name] conjugated estrogens (PREMARIN) vaginal cream 1/2 gram vaginally twice weekly 30 g 2  . loratadine (CLARITIN) 10 MG tablet Take 10 mg by mouth daily.    . Multiple Vitamin (MULTIVITAMIN)  tablet Take 1 tablet by mouth daily.    . Omega-3 Fatty Acids (FISH OIL) 1000 MG CAPS 2 capsules daily.    [name] omeprazole (PRILOSEC) 10 MG capsule Take 20 mg by mouth daily.     [name] triamcinolone ointment (KENALOG) 0.5 % Apply 1 application topically 2 times daily. 15 g 1   No current facility-administered medications on file prior to visit.    ALLERGIES: Sulfa antibiotics and Pollen extract  Family History  Problem Relation Age of Onset  . Hyperlipidemia Mother   . Stroke Mother   . Diabetes Mother   . Hypertension Mother   . Kidney disease Mother   . Thyroid disease Mother   . Eating disorder Mother   . Obesity Mother   . Arthritis Father   . Heart disease Father   . Sudden death Father   . Colon polyps Father   . Hyperlipidemia Maternal Grandmother   . Heart disease Maternal Grandmother   . Mental illness Maternal Grandmother   . Alcohol abuse Maternal Grandfather   . Arthritis Paternal Grandmother   . Hyperlipidemia Paternal Grandmother   . Heart disease Paternal Grandmother   . Alcohol abuse Paternal Grandfather   . Colon polyps Sister   . Colon cancer Neg Hx   . Esophageal cancer Neg Hx   . Rectal cancer Neg Hx   .  Stomach cancer Neg Hx     SH:  Married, non smoker  Review of Systems  Genitourinary:       No vulvar itching    PHYSICAL EXAMINATION:    BP 116/83   Pulse 64   Wt 192 lb (87.1 kg)   LMP 01/01/2014 (Approximate)   BMI 34.84 kg/m     General appearance: alert, cooperative and appears stated age Lymph:  no inguinal LAD noted  Pelvic: External genitalia:  4x67mm vulvar lesion with irregular coloration noted inferiorly and to left of introitus.  Area for excision confirmed with pt.  Procedure:  Area cleansed with Betadine x 3.  1% lidocaine instilled, 2cc total.  Using #11 blade, lesion excised in ellipse.  3 single interrupted sutures of #0 Vicryl placed for excellent hemostasis.  Dressing applied.  Pt tolerated procedure well.  Chaperone, Prince Rome, CMA, was present for exam.  Assessment/Plan: 1. Vulvar lesion - Surgical pathology( [place])  For infection prevention: - cephALEXin (KEFLEX) 500 MG capsule; Take 1 capsule (500 mg total) by mouth 3 (three) times daily.  Dispense: 21 capsule; Refill: 0   If develops yeast from antibiotics: - fluconazole (DIFLUCAN) 150 MG tablet; Take 1 tablet (150 mg total) by mouth once for 1 dose. Take one tablet.  Repeat in 72 hours if symptoms are not completely resolved.  Dispense: 2 tablet; Refill: 0  For pain management if needed:  (rx printed as pt isn't sure if will get filled) - HYDROcodone-acetaminophen (NORCO/VICODIN) 5-325 MG tablet; Take 1-2 tablets by mouth every 6 (six) hours as needed for moderate pain.  Dispense: 8 tablet; Refill: 0

## 2020-05-03 NOTE — Telephone Encounter (Signed)
Call to patient to review surgery date options. Voice mail has number confirmation. Left message to call back to [phone] to "discuss scheduling."

## 2020-05-04 ENCOUNTER — Encounter (HOSPITAL_BASED_OUTPATIENT_CLINIC_OR_DEPARTMENT_OTHER): Payer: Self-pay | Admitting: *Deleted

## 2020-05-04 NOTE — Telephone Encounter (Signed)
Patient returned call. Confirmed surgery date of 06-15-20 at [facility]. Arrive 0630.  Advised to expect letter that will be mailed and visible in My Chart. Covid testing info and hospital brochure included.   Encounter closed.

## 2020-05-04 NOTE — Telephone Encounter (Signed)
Call from patient. Aware Dr Sabra Heck recommends scheduling in approximately one month.  Patient has travel plans on 06-10-20. Will plan for 06-15-20. Will call her back once confirmed.

## 2020-05-04 NOTE — Telephone Encounter (Signed)
Call to patient to confirm surgery posting. Left message to call back.

## 2020-05-12 ENCOUNTER — Other Ambulatory Visit (HOSPITAL_BASED_OUTPATIENT_CLINIC_OR_DEPARTMENT_OTHER): Payer: Self-pay | Admitting: Obstetrics & Gynecology

## 2020-05-18 ENCOUNTER — Ambulatory Visit (INDEPENDENT_AMBULATORY_CARE_PROVIDER_SITE_OTHER): Payer: No Typology Code available for payment source | Admitting: Obstetrics & Gynecology

## 2020-05-18 ENCOUNTER — Inpatient Hospital Stay (HOSPITAL_BASED_OUTPATIENT_CLINIC_OR_DEPARTMENT_OTHER): Admission: RE | Admit: 2020-05-18 | Payer: No Typology Code available for payment source | Source: Ambulatory Visit

## 2020-05-18 ENCOUNTER — Encounter (HOSPITAL_BASED_OUTPATIENT_CLINIC_OR_DEPARTMENT_OTHER): Payer: Self-pay | Admitting: Obstetrics & Gynecology

## 2020-05-18 ENCOUNTER — Other Ambulatory Visit: Payer: Self-pay

## 2020-05-18 VITALS — BP 136/73 | HR 64 | Wt 192.0 lb

## 2020-05-18 DIAGNOSIS — N903 Dysplasia of vulva, unspecified: Secondary | ICD-10-CM

## 2020-05-19 NOTE — Progress Notes (Signed)
60 y.o. G44P0020 Married White or Caucasian female here for suture removal and discussion of upcoming procedure.  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  re excision in OR recommended.  Procedure discussed with patient.  Hospital stay, recovery and pain management all discussed.  Risks discussed including but not limited to bleeding, infection, need for additional procedure.  All questions answered.     Pt does have area of continued itching on labia majora that she would like evaluated today as well.  Thinks sutures have come out.  Has area that feels a little irregular as well.  Ob Hx:   Patient's last menstrual period was 01/01/2014 (approximate).          Sexually active: Yes.   Birth control: PMP Last pap: 10/19 neg with neg HR HPV Last MMG: has diagnostic study on friday Smoking: No   Past Surgical History:  Procedure Laterality Date  . ABDOMINOPLASTY  2006   Dr. Lesli Albee  . BREAST REDUCTION SURGERY  2006  . COLONOSCOPY     normal- but hems in [place]- age 31   . [place] OF UTERUS  2016  . FRACTURE SURGERY  1972   Arm   . GYNECOLOGIC CRYOSURGERY     at age 41  . HYSTEROSCOPY  2006  . REDUCTION MAMMAPLASTY      Past Medical History:  Diagnosis Date  . Abnormal Pap smear of cervix    age 37  . Allergy   . Chronic constipation   . Depression   . Endometriosis   . GERD (gastroesophageal reflux disease)   . Hemorrhoids   . Hyperlipidemia   . Obesity   . Osteopenia   . Pre-diabetes     Allergies: Sulfa antibiotics and Pollen extract  Current Outpatient Medications  Medication Sig Dispense Refill  . Calcium Carbonate-Vitamin D3 600-400 MG-UNIT TABS Takes 2 tablets daily.    . Cholecalciferol (VITAMIN D3) 25 MCG (1000 UT) CAPS Take by mouth daily.    [name] conjugated estrogens (PREMARIN) vaginal cream 1/2 gram vaginally twice weekly 30 g 2  . loratadine (CLARITIN) 10 MG tablet Take 10 mg by mouth daily.    . Multiple Vitamin (MULTIVITAMIN)  tablet Take 1 tablet by mouth daily.    . Omega-3 Fatty Acids (FISH OIL) 1000 MG CAPS 2 capsules daily.    [name] omeprazole (PRILOSEC) 10 MG capsule Take 20 mg by mouth daily.     [name] triamcinolone ointment (KENALOG) 0.5 % Apply 1 application topically 2 times daily. 15 g 1  . cephALEXin (KEFLEX) 500 MG capsule Take 1 capsule (500 mg total) by mouth 3 (three) times daily. 21 capsule 0  . HYDROcodone-acetaminophen (NORCO/VICODIN) 5-325 MG tablet Take 1-2 tablets by mouth every 6 (six) hours as needed for moderate pain. 8 tablet 0   No current facility-administered medications for this visit.    ROS: Pertinent items noted in HPI and remainder of comprehensive ROS otherwise negative.  Exam:   BP 136/73   Pulse 64   Wt 192 lb (87.1 kg)   LMP 01/01/2014 (Approximate)   BMI 34.84 kg/m   General appearance: alert and cooperative Lungs: clear to auscultation bilaterally Heart: regular rate and rhythm, S1, S2 normal, no murmur, click, rub or gallop  Pelvic: External genitalia:  no lesions, area of prior excision healing well, one suture removed              Urethra: normal appearing urethra with no masses, tenderness or lesions  Bartholins and Skenes: Bartholin's, Urethra, Skene's normal     Chaperone, Octaviano Batty, present for examination.   Assessment/Plan: 1. Vulvar dysplasia - plan re-excision of lesion due to positive margin.  Also plan vulvar biopsy of itching labia minora as well.  Presurgical questions/planning discussed.  questions answered.

## 2020-05-20 ENCOUNTER — Ambulatory Visit: Payer: No Typology Code available for payment source

## 2020-05-20 ENCOUNTER — Ambulatory Visit
Admission: RE | Admit: 2020-05-20 | Discharge: 2020-05-20 | Disposition: A | Discharge status: Home / Self Care | Payer: No Typology Code available for payment source | Source: Ambulatory Visit | Attending: Obstetrics & Gynecology | Admitting: Obstetrics & Gynecology

## 2020-05-20 ENCOUNTER — Other Ambulatory Visit: Payer: Self-pay

## 2020-05-20 ENCOUNTER — Other Ambulatory Visit: Payer: Self-pay | Admitting: Obstetrics & Gynecology

## 2020-05-20 DIAGNOSIS — R928 Other abnormal and inconclusive findings on diagnostic imaging of breast: Secondary | ICD-10-CM

## 2020-06-06 ENCOUNTER — Encounter (HOSPITAL_BASED_OUTPATIENT_CLINIC_OR_DEPARTMENT_OTHER): Payer: Self-pay

## 2020-06-06 ENCOUNTER — Other Ambulatory Visit: Payer: Self-pay

## 2020-06-06 ENCOUNTER — Encounter (HOSPITAL_BASED_OUTPATIENT_CLINIC_OR_DEPARTMENT_OTHER): Payer: Self-pay | Admitting: Obstetrics & Gynecology

## 2020-06-10 ENCOUNTER — Ambulatory Visit: Payer: No Typology Code available for payment source | Attending: Internal Medicine

## 2020-06-10 ENCOUNTER — Encounter (HOSPITAL_BASED_OUTPATIENT_CLINIC_OR_DEPARTMENT_OTHER)
Admission: RE | Admit: 2020-06-10 | Discharge: 2020-06-10 | Disposition: A | Discharge status: Home / Self Care | Payer: No Typology Code available for payment source | Source: Ambulatory Visit | Attending: Obstetrics & Gynecology | Admitting: Obstetrics & Gynecology

## 2020-06-10 ENCOUNTER — Other Ambulatory Visit: Payer: Self-pay

## 2020-06-10 ENCOUNTER — Other Ambulatory Visit (HOSPITAL_BASED_OUTPATIENT_CLINIC_OR_DEPARTMENT_OTHER): Payer: Self-pay

## 2020-06-10 DIAGNOSIS — Z23 Encounter for immunization: Secondary | ICD-10-CM | POA: Insufficient documentation

## 2020-06-10 LAB — CBC
HCT: 42.6 % (ref 36.0–46.0)
Hemoglobin: 14 g/dL (ref 12.0–15.0)
MCH: 30.1 pg (ref 26.0–34.0)
MCHC: 32.9 g/dL (ref 30.0–36.0)
MCV: 91.6 fL (ref 80.0–100.0)
Platelets: 231 10*3/uL (ref 150–400)
RBC: 4.65 MIL/uL (ref 3.87–5.11)
RDW: 13.1 % (ref 11.5–15.5)
WBC: 7.1 10*3/uL (ref 4.0–10.5)
nRBC: 0 % (ref 0.0–0.2)

## 2020-06-10 MED ORDER — PFIZER-BIONT COVID-19 VAC-TRIS 30 MCG/0.3ML IM SUSP
INTRAMUSCULAR | 0 refills | Status: DC
  Filled 2020-06-10: qty 0.3, 1d supply, fill #0

## 2020-06-10 NOTE — Progress Notes (Signed)

## 2020-06-10 NOTE — Progress Notes (Signed)
   Covid-19 Vaccination Clinic  Name:  Tina Chavez    MRN: [id number] DOB: Nov 09, 1960  06/10/2020  [name] was observed post Covid-19 immunization for 15 minutes without incident. She was provided with Vaccine Information Sheet and instruction to access the V-Safe system.   [name] was instructed to call 911 with any severe reactions post vaccine: Difficulty breathing  Swelling of face and throat  A fast heartbeat  A bad rash all over body  Dizziness and weakness   Immunizations Administered     Name Date Dose VIS Date Route   PFIZER Comrnaty(Gray TOP) Covid-19 Vaccine 06/10/2020 10:05 AM 0.3 mL 12/10/2019 Intramuscular   Manufacturer: [employer]   Lot: [id number]   [place]: [phone]

## 2020-06-15 ENCOUNTER — Other Ambulatory Visit: Payer: Self-pay

## 2020-06-15 ENCOUNTER — Ambulatory Visit (HOSPITAL_BASED_OUTPATIENT_CLINIC_OR_DEPARTMENT_OTHER): Payer: No Typology Code available for payment source | Admitting: Anesthesiology

## 2020-06-15 ENCOUNTER — Ambulatory Visit (HOSPITAL_BASED_OUTPATIENT_CLINIC_OR_DEPARTMENT_OTHER)
Admission: RE | Admit: 2020-06-15 | Discharge: 2020-06-15 | Disposition: A | Discharge status: Home / Self Care | Payer: No Typology Code available for payment source | Attending: Obstetrics & Gynecology | Admitting: Obstetrics & Gynecology

## 2020-06-15 ENCOUNTER — Encounter (HOSPITAL_BASED_OUTPATIENT_CLINIC_OR_DEPARTMENT_OTHER): Payer: Self-pay | Admitting: Obstetrics & Gynecology

## 2020-06-15 ENCOUNTER — Encounter (HOSPITAL_BASED_OUTPATIENT_CLINIC_OR_DEPARTMENT_OTHER)
Admission: RE | Disposition: A | Discharge status: Home / Self Care | Payer: Self-pay | Source: Home / Self Care | Attending: Obstetrics & Gynecology

## 2020-06-15 ENCOUNTER — Other Ambulatory Visit (HOSPITAL_COMMUNITY): Payer: Self-pay

## 2020-06-15 DIAGNOSIS — Z8261 Family history of arthritis: Secondary | ICD-10-CM | POA: Diagnosis not present

## 2020-06-15 DIAGNOSIS — Z8349 Family history of other endocrine, nutritional and metabolic diseases: Secondary | ICD-10-CM | POA: Insufficient documentation

## 2020-06-15 DIAGNOSIS — Z833 Family history of diabetes mellitus: Secondary | ICD-10-CM | POA: Diagnosis not present

## 2020-06-15 DIAGNOSIS — Z79899 Other long term (current) drug therapy: Secondary | ICD-10-CM | POA: Diagnosis not present

## 2020-06-15 DIAGNOSIS — D071 Carcinoma in situ of vulva: Secondary | ICD-10-CM

## 2020-06-15 DIAGNOSIS — Z841 Family history of disorders of kidney and ureter: Secondary | ICD-10-CM | POA: Insufficient documentation

## 2020-06-15 DIAGNOSIS — Z882 Allergy status to sulfonamides status: Secondary | ICD-10-CM | POA: Diagnosis not present

## 2020-06-15 DIAGNOSIS — Z823 Family history of stroke: Secondary | ICD-10-CM | POA: Diagnosis not present

## 2020-06-15 DIAGNOSIS — Z8249 Family history of ischemic heart disease and other diseases of the circulatory system: Secondary | ICD-10-CM | POA: Diagnosis not present

## 2020-06-15 HISTORY — PX: VULVA /PERINEUM BIOPSY: SHX319

## 2020-06-15 SURGERY — BIOPSY, VULVA
Anesthesia: General | Site: Vulva

## 2020-06-15 MED ORDER — FENTANYL CITRATE (PF) 100 MCG/2ML IJ SOLN
INTRAMUSCULAR | Status: DC | PRN
  Administered 2020-06-15: 50 ug via INTRAVENOUS

## 2020-06-15 MED ORDER — IBUPROFEN 800 MG PO TABS
800.0000 mg | ORAL_TABLET | Freq: Three times a day (TID) | ORAL | 0 refills | Status: AC | PRN
  Filled 2020-06-15: qty 30, 10d supply, fill #0

## 2020-06-15 MED ORDER — PROPOFOL 10 MG/ML IV BOLUS
INTRAVENOUS | Status: DC | PRN
  Administered 2020-06-15: 170 mg via INTRAVENOUS

## 2020-06-15 MED ORDER — DEXAMETHASONE SODIUM PHOSPHATE 10 MG/ML IJ SOLN
INTRAMUSCULAR | Status: AC
  Filled 2020-06-15: qty 1

## 2020-06-15 MED ORDER — MEPERIDINE HCL 25 MG/ML IJ SOLN: 6.2500 mg | INTRAMUSCULAR | Status: DC | PRN

## 2020-06-15 MED ORDER — LIDOCAINE-EPINEPHRINE 1 %-1:100000 IJ SOLN
INTRAMUSCULAR | Status: AC
  Filled 2020-06-15: qty 1

## 2020-06-15 MED ORDER — CEFAZOLIN SODIUM-DEXTROSE 2-4 GM/100ML-% IV SOLN
2.0000 g | Freq: Once | INTRAVENOUS | Status: AC
  Administered 2020-06-15: 2 g via INTRAVENOUS

## 2020-06-15 MED ORDER — HYDROCODONE-ACETAMINOPHEN 5-325 MG PO TABS: 1.0000 | ORAL_TABLET | Freq: Four times a day (QID) | ORAL | 0 refills | Status: DC | PRN

## 2020-06-15 MED ORDER — FENTANYL CITRATE (PF) 100 MCG/2ML IJ SOLN
INTRAMUSCULAR | Status: AC
  Filled 2020-06-15: qty 2

## 2020-06-15 MED ORDER — ONDANSETRON HCL 4 MG/2ML IJ SOLN
INTRAMUSCULAR | Status: AC
  Filled 2020-06-15: qty 2

## 2020-06-15 MED ORDER — LIDOCAINE HCL (PF) 2 % IJ SOLN
INTRAMUSCULAR | Status: AC
  Filled 2020-06-15: qty 5

## 2020-06-15 MED ORDER — DEXAMETHASONE SODIUM PHOSPHATE 4 MG/ML IJ SOLN
INTRAMUSCULAR | Status: DC | PRN
  Administered 2020-06-15: 5 mg via INTRAVENOUS

## 2020-06-15 MED ORDER — ONDANSETRON HCL 4 MG/2ML IJ SOLN
INTRAMUSCULAR | Status: DC | PRN
  Administered 2020-06-15: 4 mg via INTRAVENOUS

## 2020-06-15 MED ORDER — LIDOCAINE HCL (PF) 1 % IJ SOLN
INTRAMUSCULAR | Status: AC
  Filled 2020-06-15: qty 30

## 2020-06-15 MED ORDER — HYDROCODONE-ACETAMINOPHEN 5-325 MG PO TABS
1.0000 | ORAL_TABLET | Freq: Four times a day (QID) | ORAL | 0 refills | Status: DC | PRN
  Filled 2020-06-15: qty 15, 2d supply, fill #0

## 2020-06-15 MED ORDER — MIDAZOLAM HCL 2 MG/2ML IJ SOLN
INTRAMUSCULAR | Status: AC
  Filled 2020-06-15: qty 2

## 2020-06-15 MED ORDER — AMISULPRIDE (ANTIEMETIC) 5 MG/2ML IV SOLN: 10.0000 mg | Freq: Once | INTRAVENOUS | Status: DC | PRN

## 2020-06-15 MED ORDER — PROMETHAZINE HCL 25 MG/ML IJ SOLN: 6.2500 mg | INTRAMUSCULAR | Status: DC | PRN

## 2020-06-15 MED ORDER — OXYCODONE HCL 5 MG/5ML PO SOLN: 5.0000 mg | Freq: Once | ORAL | Status: DC | PRN

## 2020-06-15 MED ORDER — CEFAZOLIN SODIUM-DEXTROSE 2-4 GM/100ML-% IV SOLN
INTRAVENOUS | Status: AC
  Filled 2020-06-15: qty 100

## 2020-06-15 MED ORDER — POVIDONE-IODINE 10 % EX SWAB: 2.0000 "application " | Freq: Once | CUTANEOUS | Status: DC

## 2020-06-15 MED ORDER — LACTATED RINGERS IV SOLN: INTRAVENOUS | Status: DC

## 2020-06-15 MED ORDER — HYDROMORPHONE HCL 1 MG/ML IJ SOLN: 0.2500 mg | INTRAMUSCULAR | Status: DC | PRN

## 2020-06-15 MED ORDER — MIDAZOLAM HCL 5 MG/5ML IJ SOLN
INTRAMUSCULAR | Status: DC | PRN
  Administered 2020-06-15: 2 mg via INTRAVENOUS

## 2020-06-15 MED ORDER — LIDOCAINE HCL 1 % IJ SOLN
INTRAMUSCULAR | Status: DC | PRN
  Administered 2020-06-15: 5 mL

## 2020-06-15 MED ORDER — OXYCODONE HCL 5 MG PO TABS: 5.0000 mg | ORAL_TABLET | Freq: Once | ORAL | Status: DC | PRN

## 2020-06-15 MED ORDER — LIDOCAINE HCL (CARDIAC) PF 100 MG/5ML IV SOSY
PREFILLED_SYRINGE | INTRAVENOUS | Status: DC | PRN
  Administered 2020-06-15: 30 mg via INTRAVENOUS

## 2020-06-15 SURGICAL SUPPLY — 20 items
BLADE SURG 15 STRL LF DISP TIS (BLADE) ×1 IMPLANT
BLADE SURG 15 STRL SS (BLADE) ×2
ELECT REM PT RETURN 9FT ADLT (ELECTROSURGICAL)
ELECTRODE REM PT RTRN 9FT ADLT (ELECTROSURGICAL) IMPLANT
GLOVE SURG ENC MOIS LTX SZ7 (GLOVE) ×4 IMPLANT
GOWN STRL REUS W/ TWL LRG LVL3 (GOWN DISPOSABLE) ×2 IMPLANT
GOWN STRL REUS W/TWL LRG LVL3 (GOWN DISPOSABLE) ×4
NEEDLE HYPO 22GX1.5 SAFETY (NEEDLE) ×2 IMPLANT
NS IRRIG 1000ML POUR BTL (IV SOLUTION) ×2 IMPLANT
PACK VAGINAL MINOR WOMEN LF (CUSTOM PROCEDURE TRAY) ×2 IMPLANT
PAD OB MATERNITY 4.3X12.25 (PERSONAL CARE ITEMS) ×2 IMPLANT
PAD PREP 24X48 CUFFED NSTRL (MISCELLANEOUS) ×2 IMPLANT
PENCIL SMOKE EVAC W/HOLSTER (ELECTROSURGICAL) ×2 IMPLANT
PUNCH BIOPSY DERMAL 3MM (MISCELLANEOUS) IMPLANT
PUNCH BIOPSY DERMAL 4MM (MISCELLANEOUS) IMPLANT
SUT VIC AB 2-0 SH 27 (SUTURE) ×4
SUT VIC AB 2-0 SH 27XBRD (SUTURE) ×2 IMPLANT
TOWEL OR 17X24 6PK STRL BLUE (TOWEL DISPOSABLE) ×4 IMPLANT
TUBING NON-CON 1/4 X 20 CONN (TUBING) IMPLANT
YANKAUER SUCT BULB TIP NO VENT (SUCTIONS) IMPLANT

## 2020-06-15 NOTE — Discharge Instructions (Addendum)
Post-surgical Instructions, Outpatient Surgery  You may expect to feel dizzy, weak, and drowsy for as long as 24 hours after receiving the medicine that made you sleep (anesthetic). For the first 24 hours after your surgery:   Do not drive a car, ride a bicycle, participate in physical activities, or take public transportation until you are done taking narcotic pain medicines or as directed by Dr. Sabra Heck.  Do not drink alcohol or take tranquilizers.  Do not take medicine that has not been prescribed by your physicians.  Do not sign important papers or make important decisions while on narcotic pain medicines.  Have a responsible person with you.   CARE OF INCISION If you have a bandage, you may remove it in one day.  If there are steri-strips or dermabond, just let this loosen on its own.  You may shower on the first day after your surgery.  Do not sit in a tub bath for one week. Avoid heavy lifting (more than 10 pounds/4.5 kilograms), pushing, or pulling.  Avoid activities that may risk injury to your incisions.   PAIN MANAGEMENT Motrin 800mg .  (This is the same as 4-200mg  over the counter tablets of Motrin or ibuprofen.)  You may take this every eight hours or as needed for cramping.   Vicodin 5/325mg .  For more severe pain, take one or two tablets every four to six hours as needed for pain control.  (Remember that narcotic pain medications increase your risk of constipation.  If this becomes a problem, you may take an over the counter stool softener like Colace 100mg  up to four times a day.)  DO'S AND DON'T'S Do not take a tub bath for two week.  You may shower on the first day after your surgery Do not do any heavy lifting for one to two weeks.  This increases the chance of bleeding. Do move around as you feel able.  Stairs are fine.  You may begin to exercise again as you feel able.  Do not lift any weights for two weeks. Do not put anything in the vagina for two weeks--no tampons,  intercourse, or douching.    REGULAR MEDIATIONS/VITAMINS: You may restart all of your regular medications as prescribed. You may restart all of your vitamins as you normally take them.    PLEASE CALL OR SEEK MEDICAL CARE IF: You have persistent nausea and vomiting.  You have trouble eating or drinking.  You have an oral temperature above 100.5.  You have constipation that is not helped by adjusting diet or increasing fluid intake. Pain medicines are a common cause of constipation.  You have redness or drainage from your incision or there is increasing pain or tenderness near or in the surgical site.    Post Anesthesia Home Care Instructions  Activity: Get plenty of rest for the remainder of the day. A responsible individual must stay with you for 24 hours following the procedure.  For the next 24 hours, DO NOT: -Drive a car -Paediatric nurse -Drink alcoholic beverages -Take any medication unless instructed by your physician -Make any legal decisions or sign important papers.  Meals: Start with liquid foods such as gelatin or soup. Progress to regular foods as tolerated. Avoid greasy, spicy, heavy foods. If nausea and/or vomiting occur, drink only clear liquids until the nausea and/or vomiting subsides. Call your physician if vomiting continues.  Special Instructions/Symptoms: Your throat may feel dry or sore from the anesthesia or the breathing tube placed in your throat during  surgery. If this causes discomfort, gargle with warm salt water. The discomfort should disappear within 24 hours.  If you had a scopolamine patch placed behind your ear for the management of post- operative nausea and/or vomiting:  1. The medication in the patch is effective for 72 hours, after which it should be removed.  Wrap patch in a tissue and discard in the trash. Wash hands thoroughly with soap and water. 2. You may remove the patch earlier than 72 hours if you experience unpleasant side effects  which may include dry mouth, dizziness or visual disturbances. 3. Avoid touching the patch. Wash your hands with soap and water after contact with the patch.

## 2020-06-15 NOTE — Anesthesia Postprocedure Evaluation (Signed)
Anesthesia Post Note  Patient: Tina Chavez  Procedure(s) Performed: RE-EXCISION VULVAR BIOPSY-  Wide Local Excision (Vulva)     Patient location during evaluation: PACU Anesthesia Type: General Level of consciousness: awake and alert Pain management: pain level controlled Vital Signs Assessment: post-procedure vital signs reviewed and stable Respiratory status: spontaneous breathing, nonlabored ventilation and respiratory function stable Cardiovascular status: blood pressure returned to baseline and stable Postop Assessment: no apparent nausea or vomiting Anesthetic complications: no   No notable events documented.  Last Vitals:  Vitals:   06/15/20 0945 06/15/20 1012  BP: 117/70 112/75  Pulse: (!) 50 (!) 58  Resp: 12 14  Temp:  36.6 C  SpO2: 97% 97%    Last Pain:  Vitals:   06/15/20 0957  TempSrc:   PainSc: 0-No pain                 Lynda Rainwater

## 2020-06-15 NOTE — Transfer of Care (Signed)
Immediate Anesthesia Transfer of Care Note  Patient: Tina Chavez  Procedure(s) Performed: RE-EXCISION VULVAR BIOPSY-  Wide Local Excision (Vulva)  Patient Location: PACU  Anesthesia Type:General  Level of Consciousness: drowsy  Airway & Oxygen Therapy: Patient Spontanous Breathing and Patient connected to face mask oxygen  Post-op Assessment: Report given to RN and Post -op Vital signs reviewed and stable  Post vital signs: Reviewed and stable  Last Vitals:  Vitals Value Taken Time  BP 112/68 06/15/20 0909  Temp    Pulse 65 06/15/20 0910  Resp 7 06/15/20 0910  SpO2 93 % 06/15/20 0910  Vitals shown include unvalidated device data.  Last Pain:  Vitals:   06/15/20 0721  TempSrc: Oral  PainSc: 0-No pain         Complications: No notable events documented.

## 2020-06-15 NOTE — Anesthesia Preprocedure Evaluation (Signed)
Anesthesia Evaluation  Patient identified by MRN, date of birth, ID band Patient awake    Reviewed: Allergy & Precautions, NPO status , Patient's Chart, lab work & pertinent test results  Airway Mallampati: II  TM Distance: >3 FB Neck ROM: Full    Dental no notable dental hx.    Pulmonary neg pulmonary ROS,    Pulmonary exam normal breath sounds clear to auscultation       Cardiovascular negative cardio ROS Normal cardiovascular exam Rhythm:Regular Rate:Normal     Neuro/Psych Depression negative neurological ROS  negative psych ROS   GI/Hepatic negative GI ROS, Neg liver ROS,   Endo/Other  negative endocrine ROS  Renal/GU negative Renal ROS  negative genitourinary   Musculoskeletal negative musculoskeletal ROS (+)   Abdominal (+) + obese,   Peds negative pediatric ROS (+)  Hematology negative hematology ROS (+)   Anesthesia Other Findings   Reproductive/Obstetrics negative OB ROS                             Anesthesia Physical Anesthesia Plan  ASA: 2  Anesthesia Plan: General   Post-op Pain Management:    Induction: Intravenous  PONV Risk Score and Plan: 3 and Ondansetron, Dexamethasone, Midazolam and Treatment may vary due to age or medical condition  Airway Management Planned: LMA  Additional Equipment:   Intra-op Plan:   Post-operative Plan: Extubation in OR  Informed Consent: I have reviewed the patients History and Physical, chart, labs and discussed the procedure including the risks, benefits and alternatives for the proposed anesthesia with the patient or authorized representative who has indicated his/her understanding and acceptance.     Dental advisory given  Plan Discussed with: CRNA  Anesthesia Plan Comments:         Anesthesia Quick Evaluation

## 2020-06-15 NOTE — H&P (Signed)
Tina Chavez is an 60 y.o. 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  Procedure including risks and benefits have been discussed.  Pt here here for procedure.  She understands she will have stiches that will need to be removed post op.  Bleeding, infection, and possible need for future procedure also discussed.  Risks and benefits reviewed.  Pt here and ready to proceed.  Pertinent Gynecological History: Menses: post-menopausal Bleeding: none Last mammogram: abnormal: microcysts noted, follow up 6 months planned  Date: 05/21/2019 Last pap: normal Date: 10/2017 OB History: G2, P2   Menstrual History: Patient's last menstrual period was 01/01/2014 (approximate).    Past Medical History:  Diagnosis Date   Abnormal Pap smear of cervix    age 54   Allergy    Cancer ([place]) 05/2020   vulvar carcinoma in situ   Chronic constipation    Depression    Endometriosis    GERD (gastroesophageal reflux disease)    Hemorrhoids    Hyperlipidemia    Obesity    Osteopenia    Pre-diabetes     Past Surgical History:  Procedure Laterality Date   ABDOMINOPLASTY  2006   Dr. Lesli Albee   BREAST REDUCTION SURGERY  2006   COLONOSCOPY     normal- but [facility] in [place]- age 21    [place]  2016   [place]   Arm    GYNECOLOGIC CRYOSURGERY     at age 71   HYSTEROSCOPY  2006   REDUCTION MAMMAPLASTY      Family History  Problem Relation Age of Onset   Hyperlipidemia Mother    Stroke Mother    Diabetes Mother    Hypertension Mother    Kidney disease Mother    Thyroid disease Mother    Eating disorder Mother    Obesity Mother    Arthritis Father    Heart disease Father    Sudden death Father    Colon polyps Father    Hyperlipidemia Maternal Grandmother    Heart disease Maternal Grandmother    Mental illness Maternal Grandmother    Alcohol abuse Maternal Grandfather    Arthritis Paternal Grandmother     Hyperlipidemia Paternal Grandmother    Heart disease Paternal Grandmother    Alcohol abuse Paternal Grandfather    Colon polyps Sister    Colon cancer Neg Hx    Esophageal cancer Neg Hx    Rectal cancer Neg Hx    Stomach cancer Neg Hx     Social History:  reports that she has never smoked. She has never used smokeless tobacco. She reports that she does not drink alcohol and does not use drugs.  Allergies:  Allergies  Allergen Reactions   Sulfa Antibiotics Anaphylaxis   Pollen Extract     Medications Prior to Admission  Medication Sig Dispense Refill Last Dose   Calcium Carbonate-Vitamin D3 600-400 MG-UNIT TABS Takes 2 tablets daily.   06/06/2020 at Unknown time   Cholecalciferol (VITAMIN D3) 25 MCG (1000 UT) CAPS Take by mouth daily.   06/06/2020 at Unknown time   loratadine (CLARITIN) 10 MG tablet Take 10 mg by mouth daily.   06/06/2020 at Unknown time   Multiple Vitamin (MULTIVITAMIN) tablet Take 1 tablet by mouth daily.   06/06/2020 at Unknown time   Omega-3 Fatty Acids (FISH OIL) 1000 MG CAPS 2 capsules daily.   06/06/2020 at Unknown time   omeprazole (PRILOSEC)  10 MG capsule Take 20 mg by mouth daily.    06/06/2020 at Unknown time   conjugated estrogens (PREMARIN) vaginal cream 1/2 gram vaginally twice weekly 30 g 2 More than a month at Unknown time   COVID-19 mRNA Vac-TriS, Pfizer, (PFIZER-BIONT COVID-19 VAC-TRIS) SUSP injection Inject into the muscle. 0.3 mL 0    triamcinolone ointment (KENALOG) 0.5 % Apply 1 application topically 2 times daily. 15 g 1     Review of Systems  All other systems reviewed and are negative.  Height 5\' 2"  (1.575 m), weight 87.1 kg, last menstrual period 01/01/2014. Physical Exam Cardiovascular:     Rate and Rhythm: Normal rate and regular rhythm.     Heart sounds: Normal heart sounds.  Pulmonary:     Effort: Pulmonary effort is normal.     Breath sounds: Normal breath sounds.  Skin:    General: Skin is warm and dry.  Neurological:     General:  No focal deficit present.     Mental Status: She is alert.  Psychiatric:        Mood and Affect: Mood normal.    No results found for this or any previous visit (from the past 24 hour(s)).  No results found.  Assessment/Plan: 60 yo with vulvar carcinoma in situ with positive margin here for re excision.  Due to size, felt this was best done in the OR.  All questions answered.  Megan Salon 06/15/2020, 7:09 AM

## 2020-06-15 NOTE — Op Note (Signed)
06/15/2020  9:06 AM  PATIENT:  Tina Chavez  60 y.o. female  PRE-OPERATIVE DIAGNOSIS:  Vulvar Carcinoma in Situ  POST-OPERATIVE DIAGNOSIS:  Vulvar Carcinoma in Situ   PROCEDURE:  Procedure(s): RE-EXCISION VULVAR BIOPSY-  Wide Local Excision  SURGEON:  Megan Salon  ASSISTANTS: OR staff  ANESTHESIA:   general  ESTIMATED BLOOD LOSS: 2 mL  BLOOD ADMINISTERED:none   FLUIDS: 400cc LR  UOP: voided before going back to OR  SPECIMEN:  1.5 x 2.0cm of vulvar tissue with tag a 12 o'clock  DISPOSITION OF SPECIMEN:  PATHOLOGY  FINDINGS: area where prior office excision was performed noted, no abnormal inguinal LAD noted  DESCRIPTION OF OPERATION: Patient was taken to the operating room.  She is placed in the supine position. SCDs were on her lower extremities and functioning properly. General anesthesia with an LMA was administered without difficulty. Dr. Candida Peeling, anesthesia, oversaw case.  Legs were then placed in the [place] in the low lithotomy position. The legs were lifted to the high lithotomy position and the Betadine prep was used on the inner thighs perineum and vagina x3. Patient was draped in a normal standard fashion. Pt voided before going back to OR so no I&O cath was performed.    Area of prior excision noted on the inferior left vulvar towards the buttocks.  Using #15 blade, ellipse incision made to get adequate margins for prior margin positive vulvar carcinoma in-situ.  Using #2.0 vicryl, deeper single interrupted stitch placed to help reapporximate incision.  Then incision closed with single interrupted sutures of #2.0 Vicryl.  No bleeding noted.  Incision instilled with 5cc of 1% lidocaine mixed one-to-one with epinephrine (1:100,000 units).  At this point, the procedure was ended.  The prep was cleansed of the patient's skin. Dressing applied.  The legs are positioned back in the supine position. Sponge, lap, needle, initially counts were correct x2. Patient  was taken to recovery in stable condition.  COUNTS:  YES  PLAN OF CARE: Transfer to PACU

## 2020-06-15 NOTE — Anesthesia Procedure Notes (Signed)
Procedure Name: LMA Insertion Date/Time: 06/15/2020 8:40 AM Performed by: Lieutenant Diego, CRNA Pre-anesthesia Checklist: Patient identified, Emergency Drugs available, Suction available and Patient being monitored Patient Re-evaluated:Patient Re-evaluated prior to induction Oxygen Delivery Method: Circle system utilized Preoxygenation: Pre-oxygenation with 100% oxygen Induction Type: IV induction Ventilation: Mask ventilation without difficulty LMA: LMA inserted LMA Size: 4.0 Number of attempts: 1 Placement Confirmation: positive ETCO2 and breath sounds checked- equal and bilateral Tube secured with: Tape Dental Injury: Teeth and Oropharynx as per pre-operative assessment

## 2020-06-16 ENCOUNTER — Encounter (HOSPITAL_BASED_OUTPATIENT_CLINIC_OR_DEPARTMENT_OTHER): Payer: Self-pay | Admitting: Obstetrics & Gynecology

## 2020-06-16 LAB — SURGICAL PATHOLOGY

## 2020-07-01 ENCOUNTER — Ambulatory Visit (INDEPENDENT_AMBULATORY_CARE_PROVIDER_SITE_OTHER): Payer: No Typology Code available for payment source | Admitting: Obstetrics & Gynecology

## 2020-07-01 ENCOUNTER — Other Ambulatory Visit: Payer: Self-pay

## 2020-07-01 ENCOUNTER — Encounter (HOSPITAL_BASED_OUTPATIENT_CLINIC_OR_DEPARTMENT_OTHER): Payer: Self-pay | Admitting: Obstetrics & Gynecology

## 2020-07-01 VITALS — BP 152/62 | HR 64 | Ht 62.0 in | Wt 189.4 lb

## 2020-07-01 DIAGNOSIS — D071 Carcinoma in situ of vulva: Secondary | ICD-10-CM

## 2020-07-05 DIAGNOSIS — D071 Carcinoma in situ of vulva: Secondary | ICD-10-CM | POA: Insufficient documentation

## 2020-07-05 NOTE — Progress Notes (Signed)
GYNECOLOGY  VISIT  CC:   post op recheck  HPI: 60 y.o. G2P0020 Married White or Caucasian female here for recheck/suture removal after undergoing excision of high grade VIN 2-3 on 06/15/2020.  Pathology reviewed.  Positive margins with VIN 1 noted.  Will plan vulvar colposcopy in 6 months.  She reports no bleeding and no pain.  Questions answered.    MEDS:   Current Outpatient Medications on File Prior to Visit  Medication Sig Dispense Refill   Calcium Carbonate-Vitamin D3 600-400 MG-UNIT TABS Takes 2 tablets daily.     Cholecalciferol (VITAMIN D3) 25 MCG (1000 UT) CAPS Take by mouth daily.     conjugated estrogens (PREMARIN) vaginal cream 1/2 gram vaginally twice weekly 30 g 2   ibuprofen (ADVIL) 800 MG tablet Take 1 tablet (800 mg total) by mouth every 8 (eight) hours as needed. 30 tablet 0   loratadine (CLARITIN) 10 MG tablet Take 10 mg by mouth daily.     Multiple Vitamin (MULTIVITAMIN) tablet Take 1 tablet by mouth daily.     Omega-3 Fatty Acids (FISH OIL) 1000 MG CAPS 2 capsules daily.     omeprazole (PRILOSEC) 10 MG capsule Take 20 mg by mouth daily.      triamcinolone ointment (KENALOG) 0.5 % Apply 1 application topically 2 times daily. 15 g 1   No current facility-administered medications on file prior to visit.    SH:  Smoking No    PHYSICAL EXAMINATION:    BP (!) 152/62 (BP Location: Left Arm, Patient Position: Sitting, Cuff Size: Large)   Pulse 64   Ht 5\' 2"  (1.575 m) Comment: reported  Wt 189 lb 6.4 oz (85.9 kg)   LMP 01/01/2014 (Approximate)   BMI 34.64 kg/m     General appearance: alert, cooperative and appears stated age  Pelvic: External genitalia:  no lesions, sutures are dissolved  Assessment/Plan: 1. Vulvar intraepithelial neoplasia (VIN) grade 3 - plan vulvar colposcopy in 6 months

## 2020-10-05 ENCOUNTER — Ambulatory Visit: Payer: No Typology Code available for payment source | Attending: Internal Medicine

## 2020-10-05 ENCOUNTER — Other Ambulatory Visit (HOSPITAL_BASED_OUTPATIENT_CLINIC_OR_DEPARTMENT_OTHER): Payer: Self-pay

## 2020-10-05 DIAGNOSIS — Z23 Encounter for immunization: Secondary | ICD-10-CM

## 2020-10-05 MED ORDER — PFIZER COVID-19 VAC BIVALENT 30 MCG/0.3ML IM SUSP
INTRAMUSCULAR | 0 refills | Status: DC
Start: 2020-10-05 — End: 2021-06-19
  Filled 2020-10-05: qty 0.3, 1d supply, fill #0

## 2020-10-05 NOTE — Progress Notes (Signed)
   Covid-19 Vaccination Clinic  Name:  Tina Chavez    MRN: [id number] DOB: December 24, 1960  10/05/2020  [name] was observed post Covid-19 immunization for 15 minutes without incident. She was provided with Vaccine Information Sheet and instruction to access the V-Safe system.   [name] was instructed to call 911 with any severe reactions post vaccine: Difficulty breathing  Swelling of face and throat  A fast heartbeat  A bad rash all over body  Dizziness and weakness

## 2020-11-18 ENCOUNTER — Other Ambulatory Visit (HOSPITAL_COMMUNITY): Payer: Self-pay

## 2020-11-18 MED ORDER — DIAZEPAM 10 MG PO TABS
ORAL_TABLET | ORAL | 0 refills | Status: DC
  Filled 2020-11-18: qty 1, 1d supply, fill #0

## 2020-11-22 ENCOUNTER — Other Ambulatory Visit: Payer: Self-pay | Admitting: Obstetrics & Gynecology

## 2020-11-22 ENCOUNTER — Other Ambulatory Visit: Payer: Self-pay

## 2020-11-22 ENCOUNTER — Ambulatory Visit
Admission: RE | Admit: 2020-11-22 | Discharge: 2020-11-22 | Disposition: A | Discharge status: Home / Self Care | Payer: No Typology Code available for payment source | Source: Ambulatory Visit | Attending: Obstetrics & Gynecology | Admitting: Obstetrics & Gynecology

## 2020-11-22 DIAGNOSIS — N6320 Unspecified lump in the left breast, unspecified quadrant: Secondary | ICD-10-CM

## 2020-11-22 DIAGNOSIS — R928 Other abnormal and inconclusive findings on diagnostic imaging of breast: Secondary | ICD-10-CM

## 2020-12-01 ENCOUNTER — Other Ambulatory Visit (HOSPITAL_COMMUNITY)
Admission: RE | Admit: 2020-12-01 | Discharge: 2020-12-01 | Disposition: A | Discharge status: Home / Self Care | Payer: No Typology Code available for payment source | Source: Ambulatory Visit | Attending: Obstetrics & Gynecology | Admitting: Obstetrics & Gynecology

## 2020-12-01 ENCOUNTER — Encounter (HOSPITAL_BASED_OUTPATIENT_CLINIC_OR_DEPARTMENT_OTHER): Payer: Self-pay | Admitting: Obstetrics & Gynecology

## 2020-12-01 ENCOUNTER — Ambulatory Visit (INDEPENDENT_AMBULATORY_CARE_PROVIDER_SITE_OTHER): Payer: No Typology Code available for payment source | Admitting: Obstetrics & Gynecology

## 2020-12-01 VITALS — BP 136/74 | HR 65 | Ht 62.0 in | Wt 197.0 lb

## 2020-12-01 DIAGNOSIS — N9089 Other specified noninflammatory disorders of vulva and perineum: Secondary | ICD-10-CM | POA: Insufficient documentation

## 2020-12-01 DIAGNOSIS — L821 Other seborrheic keratosis: Secondary | ICD-10-CM

## 2020-12-01 DIAGNOSIS — D071 Carcinoma in situ of vulva: Secondary | ICD-10-CM

## 2020-12-02 NOTE — Progress Notes (Signed)
60 y.o. G13P0020 Married female here for colposcopy with possible biopsies VIN 3 noted with excision of vulvar lesion in June.  Margins were positive for VIN 1.  Pt has noted a new lesion that she would like removed today as well.    Pt is PMP.         Patient has been counseled about results and procedure.  Risks and benefits have bene reviewed including immediate and/or delayed bleeding, infection, cervical scaring from procedure, possibility of needing additional follow up as well as treatment.  Rare risks of missing a lesion discussed as well.  All questions answered.  Pt ready to proceed.  Consent obtained.  BP 136/74 (BP Location: Right Arm, Patient Position: Sitting, Cuff Size: Normal)   Pulse 65   Ht 5\' 2"  (1.575 m) Comment: reported  Wt 197 lb (89.4 kg)   LMP 01/01/2014 (Approximate)   BMI 36.03 kg/m   General appearance: alert, cooperative and appears stated age Lymph nodes: No abnormal inguinal nodes palpated Neurologic: Grossly normal  Pelvic: External genitalia:  no lesions              Urethra:  normal appearing urethra with no masses, tenderness or lesions              Bartholins and Skenes: normal                 Vagina: normal appearing vagina with normal color and no discharge, no lesions               Physical Exam Constitutional:      Appearance: Normal appearance.  Genitourinary:   Neurological:     Mental Status: She is alert.    Speculum placed.  3% acetic acid applied to entire vulvar for 3 minutes.  In systematic fashion, entire vulva visualized.  No abnormal lesions noted except the new pigmented skin lesion that is consistent with a skin tag.   Pt desired removal.  Area cleansed with Betadine.  Sterile technique used throughout procedure.  Skin anesthestized with Lidocaine 1% plain; 1.12mL.  Lesion grasped with pick-ups and excised with scissors.  Adequate hemostasis obtained with silver nitrate sticks.  Dressing was applied.  Pt tolerated procedure  well.  Chaperone, Octaviano Batty, was present during procedure.  Assessment/Plan: 1. Vulvar intraepithelial neoplasia (VIN) grade 3 - recheck vulva in 6 months with AEX  2. Vulvar lesion - Surgical pathology( [place]/ [facility]) - Pathology results will be called to patient and follow-up planned pending results.

## 2020-12-05 LAB — SURGICAL PATHOLOGY

## 2020-12-13 ENCOUNTER — Encounter (HOSPITAL_BASED_OUTPATIENT_CLINIC_OR_DEPARTMENT_OTHER): Payer: Self-pay | Admitting: Obstetrics & Gynecology

## 2020-12-19 ENCOUNTER — Other Ambulatory Visit (HOSPITAL_COMMUNITY): Payer: Self-pay

## 2020-12-19 MED ORDER — AMOXICILLIN 875 MG PO TABS
875.0000 mg | ORAL_TABLET | Freq: Two times a day (BID) | ORAL | 0 refills | Status: DC
  Filled 2020-12-19: qty 20, 10d supply, fill #0

## 2020-12-28 ENCOUNTER — Other Ambulatory Visit (HOSPITAL_COMMUNITY): Payer: Self-pay

## 2020-12-28 MED ORDER — AMOXICILLIN 500 MG PO CAPS
500.0000 mg | ORAL_CAPSULE | Freq: Three times a day (TID) | ORAL | 0 refills | Status: DC
  Filled 2020-12-28: qty 31, 10d supply, fill #0

## 2021-01-09 DIAGNOSIS — H52223 Regular astigmatism, bilateral: Secondary | ICD-10-CM | POA: Diagnosis not present

## 2021-01-09 DIAGNOSIS — H5213 Myopia, bilateral: Secondary | ICD-10-CM | POA: Diagnosis not present

## 2021-01-09 DIAGNOSIS — H524 Presbyopia: Secondary | ICD-10-CM | POA: Diagnosis not present

## 2021-01-09 DIAGNOSIS — Z135 Encounter for screening for eye and ear disorders: Secondary | ICD-10-CM | POA: Diagnosis not present

## 2021-03-06 DIAGNOSIS — N951 Menopausal and female climacteric states: Secondary | ICD-10-CM | POA: Diagnosis not present

## 2021-03-06 DIAGNOSIS — R7989 Other specified abnormal findings of blood chemistry: Secondary | ICD-10-CM | POA: Diagnosis not present

## 2021-03-06 DIAGNOSIS — E785 Hyperlipidemia, unspecified: Secondary | ICD-10-CM | POA: Diagnosis not present

## 2021-03-06 DIAGNOSIS — R635 Abnormal weight gain: Secondary | ICD-10-CM | POA: Diagnosis not present

## 2021-03-06 DIAGNOSIS — Z6837 Body mass index (BMI) 37.0-37.9, adult: Secondary | ICD-10-CM | POA: Diagnosis not present

## 2021-03-06 DIAGNOSIS — R7303 Prediabetes: Secondary | ICD-10-CM | POA: Diagnosis not present

## 2021-03-13 ENCOUNTER — Other Ambulatory Visit (HOSPITAL_BASED_OUTPATIENT_CLINIC_OR_DEPARTMENT_OTHER): Payer: Self-pay

## 2021-03-13 DIAGNOSIS — R7989 Other specified abnormal findings of blood chemistry: Secondary | ICD-10-CM | POA: Diagnosis not present

## 2021-03-13 DIAGNOSIS — R7303 Prediabetes: Secondary | ICD-10-CM | POA: Diagnosis not present

## 2021-03-13 DIAGNOSIS — Z1339 Encounter for screening examination for other mental health and behavioral disorders: Secondary | ICD-10-CM | POA: Diagnosis not present

## 2021-03-13 DIAGNOSIS — E785 Hyperlipidemia, unspecified: Secondary | ICD-10-CM | POA: Diagnosis not present

## 2021-03-13 DIAGNOSIS — N951 Menopausal and female climacteric states: Secondary | ICD-10-CM | POA: Diagnosis not present

## 2021-03-13 DIAGNOSIS — R635 Abnormal weight gain: Secondary | ICD-10-CM | POA: Diagnosis not present

## 2021-03-13 DIAGNOSIS — Z6838 Body mass index (BMI) 38.0-38.9, adult: Secondary | ICD-10-CM | POA: Diagnosis not present

## 2021-03-13 DIAGNOSIS — Z1331 Encounter for screening for depression: Secondary | ICD-10-CM | POA: Diagnosis not present

## 2021-03-13 MED ORDER — WEGOVY 0.25 MG/0.5ML ~~LOC~~ SOAJ
SUBCUTANEOUS | 1 refills | Status: DC
  Filled 2021-03-13: qty 2, 28d supply, fill #0

## 2021-03-14 ENCOUNTER — Other Ambulatory Visit (HOSPITAL_BASED_OUTPATIENT_CLINIC_OR_DEPARTMENT_OTHER): Payer: Self-pay

## 2021-03-16 ENCOUNTER — Other Ambulatory Visit (HOSPITAL_BASED_OUTPATIENT_CLINIC_OR_DEPARTMENT_OTHER): Payer: Self-pay

## 2021-03-17 ENCOUNTER — Other Ambulatory Visit (HOSPITAL_BASED_OUTPATIENT_CLINIC_OR_DEPARTMENT_OTHER): Payer: Self-pay

## 2021-03-20 ENCOUNTER — Other Ambulatory Visit (HOSPITAL_BASED_OUTPATIENT_CLINIC_OR_DEPARTMENT_OTHER): Payer: Self-pay

## 2021-03-21 ENCOUNTER — Other Ambulatory Visit (HOSPITAL_BASED_OUTPATIENT_CLINIC_OR_DEPARTMENT_OTHER): Payer: Self-pay

## 2021-03-29 DIAGNOSIS — E785 Hyperlipidemia, unspecified: Secondary | ICD-10-CM | POA: Diagnosis not present

## 2021-03-29 DIAGNOSIS — Z6837 Body mass index (BMI) 37.0-37.9, adult: Secondary | ICD-10-CM | POA: Diagnosis not present

## 2021-04-03 DIAGNOSIS — Z6838 Body mass index (BMI) 38.0-38.9, adult: Secondary | ICD-10-CM | POA: Diagnosis not present

## 2021-04-03 DIAGNOSIS — R7303 Prediabetes: Secondary | ICD-10-CM | POA: Diagnosis not present

## 2021-04-10 ENCOUNTER — Other Ambulatory Visit (HOSPITAL_BASED_OUTPATIENT_CLINIC_OR_DEPARTMENT_OTHER): Payer: Self-pay

## 2021-04-10 DIAGNOSIS — Z6838 Body mass index (BMI) 38.0-38.9, adult: Secondary | ICD-10-CM | POA: Diagnosis not present

## 2021-04-10 DIAGNOSIS — E785 Hyperlipidemia, unspecified: Secondary | ICD-10-CM | POA: Diagnosis not present

## 2021-04-10 MED ORDER — WEGOVY 0.5 MG/0.5ML ~~LOC~~ SOAJ
SUBCUTANEOUS | 0 refills | Status: DC
  Filled 2021-04-10: qty 2, 28d supply, fill #0

## 2021-04-11 ENCOUNTER — Other Ambulatory Visit (HOSPITAL_BASED_OUTPATIENT_CLINIC_OR_DEPARTMENT_OTHER): Payer: Self-pay

## 2021-04-17 ENCOUNTER — Ambulatory Visit (HOSPITAL_BASED_OUTPATIENT_CLINIC_OR_DEPARTMENT_OTHER): Payer: No Typology Code available for payment source | Admitting: Obstetrics & Gynecology

## 2021-04-24 ENCOUNTER — Other Ambulatory Visit (HOSPITAL_COMMUNITY): Payer: Self-pay

## 2021-04-24 DIAGNOSIS — E038 Other specified hypothyroidism: Secondary | ICD-10-CM | POA: Diagnosis not present

## 2021-04-24 DIAGNOSIS — R7303 Prediabetes: Secondary | ICD-10-CM | POA: Diagnosis not present

## 2021-04-24 DIAGNOSIS — Z6837 Body mass index (BMI) 37.0-37.9, adult: Secondary | ICD-10-CM | POA: Diagnosis not present

## 2021-04-24 DIAGNOSIS — N951 Menopausal and female climacteric states: Secondary | ICD-10-CM | POA: Diagnosis not present

## 2021-04-24 MED ORDER — DIAZEPAM 10 MG PO TABS
ORAL_TABLET | ORAL | 0 refills | Status: DC
  Filled 2021-04-24: qty 1, 1d supply, fill #0

## 2021-04-27 ENCOUNTER — Encounter (HOSPITAL_BASED_OUTPATIENT_CLINIC_OR_DEPARTMENT_OTHER): Payer: Self-pay | Admitting: Obstetrics & Gynecology

## 2021-05-01 ENCOUNTER — Other Ambulatory Visit (HOSPITAL_COMMUNITY): Payer: Self-pay

## 2021-05-01 ENCOUNTER — Ambulatory Visit
Admission: RE | Admit: 2021-05-01 | Discharge: 2021-05-01 | Disposition: A | Discharge status: Home / Self Care | Payer: Commercial Managed Care - PPO | Source: Ambulatory Visit | Attending: Obstetrics & Gynecology | Admitting: Obstetrics & Gynecology

## 2021-05-01 DIAGNOSIS — R928 Other abnormal and inconclusive findings on diagnostic imaging of breast: Secondary | ICD-10-CM | POA: Diagnosis not present

## 2021-05-01 DIAGNOSIS — N6320 Unspecified lump in the left breast, unspecified quadrant: Secondary | ICD-10-CM

## 2021-05-01 DIAGNOSIS — N898 Other specified noninflammatory disorders of vagina: Secondary | ICD-10-CM | POA: Diagnosis not present

## 2021-05-01 DIAGNOSIS — N6322 Unspecified lump in the left breast, upper inner quadrant: Secondary | ICD-10-CM | POA: Diagnosis not present

## 2021-05-01 DIAGNOSIS — N951 Menopausal and female climacteric states: Secondary | ICD-10-CM | POA: Diagnosis not present

## 2021-05-01 DIAGNOSIS — Z6838 Body mass index (BMI) 38.0-38.9, adult: Secondary | ICD-10-CM | POA: Diagnosis not present

## 2021-05-01 MED ORDER — LIOTHYRONINE SODIUM 5 MCG PO TABS
5.0000 ug | ORAL_TABLET | Freq: Every day | ORAL | 0 refills | Status: DC
  Filled 2021-05-01: qty 60, 30d supply, fill #0

## 2021-05-08 DIAGNOSIS — E785 Hyperlipidemia, unspecified: Secondary | ICD-10-CM | POA: Diagnosis not present

## 2021-05-08 DIAGNOSIS — Z6837 Body mass index (BMI) 37.0-37.9, adult: Secondary | ICD-10-CM | POA: Diagnosis not present

## 2021-05-15 ENCOUNTER — Other Ambulatory Visit (HOSPITAL_BASED_OUTPATIENT_CLINIC_OR_DEPARTMENT_OTHER): Payer: Self-pay

## 2021-05-15 DIAGNOSIS — Z6838 Body mass index (BMI) 38.0-38.9, adult: Secondary | ICD-10-CM | POA: Diagnosis not present

## 2021-05-15 DIAGNOSIS — R7303 Prediabetes: Secondary | ICD-10-CM | POA: Diagnosis not present

## 2021-05-15 DIAGNOSIS — E038 Other specified hypothyroidism: Secondary | ICD-10-CM | POA: Diagnosis not present

## 2021-05-15 MED ORDER — WEGOVY 1 MG/0.5ML ~~LOC~~ SOAJ
SUBCUTANEOUS | 0 refills | Status: DC
  Filled 2021-05-15: qty 2, 28d supply, fill #0

## 2021-05-15 MED ORDER — NP THYROID 30 MG PO TABS
ORAL_TABLET | ORAL | 1 refills | Status: DC
  Filled 2021-05-15: qty 60, 30d supply, fill #0
  Filled 2021-06-08: qty 60, 30d supply, fill #1

## 2021-05-22 DIAGNOSIS — R7303 Prediabetes: Secondary | ICD-10-CM | POA: Diagnosis not present

## 2021-05-22 DIAGNOSIS — Z6837 Body mass index (BMI) 37.0-37.9, adult: Secondary | ICD-10-CM | POA: Diagnosis not present

## 2021-06-05 ENCOUNTER — Other Ambulatory Visit (HOSPITAL_BASED_OUTPATIENT_CLINIC_OR_DEPARTMENT_OTHER): Payer: Self-pay

## 2021-06-05 DIAGNOSIS — E038 Other specified hypothyroidism: Secondary | ICD-10-CM | POA: Diagnosis not present

## 2021-06-05 DIAGNOSIS — M2550 Pain in unspecified joint: Secondary | ICD-10-CM | POA: Diagnosis not present

## 2021-06-05 DIAGNOSIS — Z6837 Body mass index (BMI) 37.0-37.9, adult: Secondary | ICD-10-CM | POA: Diagnosis not present

## 2021-06-05 MED ORDER — NP THYROID 30 MG PO TABS
ORAL_TABLET | ORAL | 2 refills | Status: DC
  Filled 2021-06-05 – 2021-07-11 (×2): qty 30, 30d supply, fill #0

## 2021-06-05 MED ORDER — WEGOVY 1 MG/0.5ML ~~LOC~~ SOAJ
SUBCUTANEOUS | 1 refills | Status: DC
Start: 2021-06-05 — End: 2021-08-14
  Filled 2021-06-05 – 2021-06-12 (×2): qty 2, 28d supply, fill #0
  Filled 2021-07-11 (×2): qty 2, 28d supply, fill #1

## 2021-06-08 ENCOUNTER — Other Ambulatory Visit (HOSPITAL_BASED_OUTPATIENT_CLINIC_OR_DEPARTMENT_OTHER): Payer: Self-pay

## 2021-06-12 ENCOUNTER — Other Ambulatory Visit (HOSPITAL_BASED_OUTPATIENT_CLINIC_OR_DEPARTMENT_OTHER): Payer: Self-pay

## 2021-06-19 ENCOUNTER — Other Ambulatory Visit (HOSPITAL_COMMUNITY)
Admission: RE | Admit: 2021-06-19 | Discharge: 2021-06-19 | Disposition: A | Discharge status: Home / Self Care | Payer: 59 | Source: Ambulatory Visit | Attending: Obstetrics & Gynecology | Admitting: Obstetrics & Gynecology

## 2021-06-19 ENCOUNTER — Encounter (HOSPITAL_BASED_OUTPATIENT_CLINIC_OR_DEPARTMENT_OTHER): Payer: Self-pay | Admitting: Obstetrics & Gynecology

## 2021-06-19 ENCOUNTER — Ambulatory Visit (INDEPENDENT_AMBULATORY_CARE_PROVIDER_SITE_OTHER): Payer: 59 | Admitting: Obstetrics & Gynecology

## 2021-06-19 VITALS — BP 138/82 | HR 74 | Ht 62.25 in | Wt 203.4 lb

## 2021-06-19 DIAGNOSIS — Z780 Asymptomatic menopausal state: Secondary | ICD-10-CM | POA: Diagnosis not present

## 2021-06-19 DIAGNOSIS — Z01419 Encounter for gynecological examination (general) (routine) without abnormal findings: Secondary | ICD-10-CM | POA: Diagnosis not present

## 2021-06-19 DIAGNOSIS — Z124 Encounter for screening for malignant neoplasm of cervix: Secondary | ICD-10-CM | POA: Diagnosis not present

## 2021-06-19 DIAGNOSIS — Z6837 Body mass index (BMI) 37.0-37.9, adult: Secondary | ICD-10-CM | POA: Diagnosis not present

## 2021-06-19 DIAGNOSIS — D071 Carcinoma in situ of vulva: Secondary | ICD-10-CM | POA: Diagnosis not present

## 2021-06-19 DIAGNOSIS — M8588 Other specified disorders of bone density and structure, other site: Secondary | ICD-10-CM

## 2021-06-19 DIAGNOSIS — R7303 Prediabetes: Secondary | ICD-10-CM | POA: Diagnosis not present

## 2021-06-19 NOTE — Addendum Note (Signed)
Addended by: Megan Salon on: 06/19/2021 03:12 PM   Modules accepted: Orders

## 2021-06-19 NOTE — Progress Notes (Signed)
61 y.o. [id number] Married White or Caucasian female here for annual exam.  Doing well.  Denies vaginal bleeding.  H/O vulvar dysplasia.  Having vulvar recheck today as well.  Going to [facility] MD.  Brita Romp been going since January.    Patient's last menstrual period was 01/01/2014 (approximate).          Sexually active: No.  The current method of family planning is post menopausal status.    Exercising: no Smoker:  no  Health Maintenance: Pap:  10/24/17 neg History of abnormal Pap:  yes MMG:  05/01/21.  Follow up 1 year. Colonoscopy:  07/07/18.  Adenomatous polyps.  Follow up 3 years.  BMD:   04/27/20 osteopenia Screening Labs: has done with [facility] and did have cholesterol done   reports that she has never smoked. She has never used smokeless tobacco. She reports that she does not drink alcohol and does not use drugs.  Past Medical History:  Diagnosis Date   Abnormal Pap smear of cervix    age 30   Allergy    Cancer ([place]) 05/2020   vulvar carcinoma in situ   Chronic constipation    Depression    Endometriosis    GERD (gastroesophageal reflux disease)    Hemorrhoids    Hyperlipidemia    Obesity    Osteopenia    Pre-diabetes     Past Surgical History:  Procedure Laterality Date   ABDOMINOPLASTY  2006   Dr. Lesli Albee   BREAST REDUCTION SURGERY  2006   COLONOSCOPY     normal- but hems in [place]- age 19    [place]  2016   [place]   Arm    GYNECOLOGIC CRYOSURGERY     at age 59   HYSTEROSCOPY  2006   REDUCTION MAMMAPLASTY     2006   VULVA /PERINEUM BIOPSY N/A 06/15/2020   Procedure: RE-EXCISION VULVAR BIOPSY-  Wide Local Excision;  Surgeon: Megan Salon, MD;  Location: [place];  Service: Gynecology;  Laterality: N/A;    Current Outpatient Medications  Medication Sig Dispense Refill   Calcium Carbonate-Vitamin D3 600-400 MG-UNIT TABS Takes 2 tablets daily.     Cholecalciferol (VITAMIN D3) 25 MCG (1000 UT)  CAPS Take by mouth daily.     conjugated estrogens (PREMARIN) vaginal cream 1/2 gram vaginally twice weekly 30 g 2   diazepam (VALIUM) 10 MG tablet TAKE ONE TABLET TWO HOURS PRIOR TO DENTAL APPOINTMENT. 1 tablet 0   ibuprofen (ADVIL) 800 MG tablet Take 1 tablet (800 mg total) by mouth every 8 (eight) hours as needed. 30 tablet 0   loratadine (CLARITIN) 10 MG tablet Take 10 mg by mouth daily.     Multiple Vitamin (MULTIVITAMIN) tablet Take 1 tablet by mouth daily.     Omega-3 Fatty Acids (FISH OIL) 1000 MG CAPS 2 capsules daily.     omeprazole (PRILOSEC) 10 MG capsule Take 20 mg by mouth daily.      Semaglutide-Weight Management (WEGOVY) 1 MG/0.5ML SOAJ Inject 0.5 ml under the skin once a week 2 mL 1   thyroid (NP THYROID) 30 MG tablet 1 tablet on an empty stomach Orally Once a day 30 day(s) 30 tablet 2   No current facility-administered medications for this visit.    Family History  Problem Relation Age of Onset   Hyperlipidemia Mother    Stroke Mother    Diabetes Mother    Hypertension Mother  Kidney disease Mother    Thyroid disease Mother    Eating disorder Mother    Obesity Mother    Arthritis Father    Heart disease Father    Sudden death Father    Colon polyps Father    Hyperlipidemia Maternal Grandmother    Heart disease Maternal Grandmother    Mental illness Maternal Grandmother    Alcohol abuse Maternal Grandfather    Arthritis Paternal Grandmother    Hyperlipidemia Paternal Grandmother    Heart disease Paternal Grandmother    Alcohol abuse Paternal Grandfather    Colon polyps Sister    Colon cancer Neg Hx    Esophageal cancer Neg Hx    Rectal cancer Neg Hx    Stomach cancer Neg Hx    Gyn: Genitourinary:negative  Exam:   BP 138/82   Pulse 74   Ht 5' 2.25" (1.581 m)   Wt 203 lb 6.4 oz (92.3 kg)   LMP 01/01/2014 (Approximate)   BMI 36.90 kg/m   Height: 5' 2.25" (158.1 cm)  General appearance: alert, cooperative and appears stated age Head:  Normocephalic, without obvious abnormality, atraumatic Neck: no adenopathy, supple, symmetrical, trachea midline and thyroid normal to inspection and palpation Lungs: clear to auscultation bilaterally Breasts: normal appearance, no masses or tenderness Heart: regular rate and rhythm Abdomen: soft, non-tender; bowel sounds normal; no masses,  no organomegaly Extremities: extremities normal, atraumatic, no cyanosis or edema Skin: Skin color, texture, turgor normal. No rashes or lesions Lymph nodes: Cervical, supraclavicular, and axillary nodes normal. No abnormal inguinal nodes palpated Neurologic: Grossly normal   Pelvic: External genitalia:  no lesions              Urethra:  normal appearing urethra with no masses, tenderness or lesions              Bartholins and Skenes: normal                 Vagina: normal appearing vagina with normal color and no discharge, no lesions              Cervix: no lesions              Pap taken: Yes.   Bimanual Exam:  Uterus:  normal size, contour, position, consistency, mobility, non-tender              Adnexa: normal adnexa and no mass, fullness, tenderness               Rectovaginal: Confirms               Anus:  normal sphincter tone, no lesions  Chaperone, Octaviano Batty, CMA, was present for exam.  Assessment/Plan: 1. Well woman exam with routine gynecological exam - pap and HR HPV obtained today - MMG up to date - colonoscopy due this year.   Pt aware. - BMD 06/2020 - vaccines reviewed.  Pt knows I do not have a Tdap date and she should get this updated if has any exposure - lab work done this year with Lyn Henri, MD  2. Postmenopausal - no HRT  3. Carcinoma in situ of vulva - vulvar recheck today normal.  Area of scar noted on left buttocks, no pigmentation present.  No new biopsy recommended today.  4. Osteopenia of lumbar spine

## 2021-06-22 ENCOUNTER — Other Ambulatory Visit (HOSPITAL_BASED_OUTPATIENT_CLINIC_OR_DEPARTMENT_OTHER): Payer: Self-pay

## 2021-06-22 LAB — CYTOLOGY - PAP
Comment: NEGATIVE
Diagnosis: NEGATIVE
High risk HPV: NEGATIVE

## 2021-07-03 ENCOUNTER — Other Ambulatory Visit (HOSPITAL_BASED_OUTPATIENT_CLINIC_OR_DEPARTMENT_OTHER): Payer: Self-pay

## 2021-07-03 DIAGNOSIS — E785 Hyperlipidemia, unspecified: Secondary | ICD-10-CM | POA: Diagnosis not present

## 2021-07-03 DIAGNOSIS — Z6836 Body mass index (BMI) 36.0-36.9, adult: Secondary | ICD-10-CM | POA: Diagnosis not present

## 2021-07-03 MED ORDER — WEGOVY 1 MG/0.5ML ~~LOC~~ SOAJ: SUBCUTANEOUS | 1 refills | Status: DC

## 2021-07-11 ENCOUNTER — Other Ambulatory Visit (HOSPITAL_BASED_OUTPATIENT_CLINIC_OR_DEPARTMENT_OTHER): Payer: Self-pay

## 2021-07-12 ENCOUNTER — Other Ambulatory Visit (HOSPITAL_BASED_OUTPATIENT_CLINIC_OR_DEPARTMENT_OTHER): Payer: Self-pay

## 2021-07-13 ENCOUNTER — Other Ambulatory Visit (HOSPITAL_BASED_OUTPATIENT_CLINIC_OR_DEPARTMENT_OTHER): Payer: Self-pay

## 2021-08-02 ENCOUNTER — Other Ambulatory Visit (HOSPITAL_COMMUNITY): Payer: Self-pay

## 2021-08-02 MED ORDER — DIAZEPAM 10 MG PO TABS
ORAL_TABLET | ORAL | 0 refills | Status: DC
  Filled 2021-08-02: qty 1, 1d supply, fill #0

## 2021-08-07 DIAGNOSIS — E785 Hyperlipidemia, unspecified: Secondary | ICD-10-CM | POA: Diagnosis not present

## 2021-08-07 DIAGNOSIS — E038 Other specified hypothyroidism: Secondary | ICD-10-CM | POA: Diagnosis not present

## 2021-08-07 DIAGNOSIS — R7303 Prediabetes: Secondary | ICD-10-CM | POA: Diagnosis not present

## 2021-08-07 DIAGNOSIS — Z6836 Body mass index (BMI) 36.0-36.9, adult: Secondary | ICD-10-CM | POA: Diagnosis not present

## 2021-08-08 ENCOUNTER — Encounter (HOSPITAL_BASED_OUTPATIENT_CLINIC_OR_DEPARTMENT_OTHER): Payer: Self-pay | Admitting: Pharmacist

## 2021-08-08 ENCOUNTER — Other Ambulatory Visit (HOSPITAL_BASED_OUTPATIENT_CLINIC_OR_DEPARTMENT_OTHER): Payer: Self-pay

## 2021-08-08 MED ORDER — THYROID 30 MG PO TABS
ORAL_TABLET | ORAL | 2 refills | Status: DC
  Filled 2021-08-08: qty 30, 30d supply, fill #0

## 2021-08-08 MED ORDER — WEGOVY 1 MG/0.5ML ~~LOC~~ SOAJ
SUBCUTANEOUS | 1 refills | Status: DC
  Filled 2021-08-08: qty 2, 28d supply, fill #0
  Filled 2021-09-18: qty 2, 28d supply, fill #1

## 2021-08-09 ENCOUNTER — Other Ambulatory Visit (HOSPITAL_BASED_OUTPATIENT_CLINIC_OR_DEPARTMENT_OTHER): Payer: Self-pay

## 2021-08-10 NOTE — Progress Notes (Signed)
08/14/2021 Tina Chavez [id number] 18-Aug-1960  Referring provider: [name], NP Primary GI doctor: [name]  ASSESSMENT AND PLAN:   Rectal bleeding History of hemorrhoids, declines rectal exam.  -Sitz baths, increase fiber, increase water, need to fix toilet habits.  Schedule for colonoscopy  History of adenomatous polyp of colon 07/07/2018 colonoscopy with [name] For screening purposes, good bowel prep, diverticulosis in sigmoid, descending and transverse, 1 mm polyp in cecum, 3 mm polyp descending colon 1 mm polyp descending colon, 2 mm polyp hepatic flexure, pathology showed all 4 diminutive polyps tubular adenomas recall 3 years.  Chronic idiopathic constipation - linzess samples given 145 and 290- call with what works better - instructions how to take given to patient in AVS - lifestyle changes discussed  - if not helpful consider motegrity/pelvic floor PT  Gastroesophageal reflux disease, unspecified whether esophagitis present she reports symptoms are currently well controlled. Lifestyle changes discussed, avoid NSAIDS Continue current medications   Patient Care Team: [name], NP as PCP - General (Internal Medicine)  HISTORY OF PRESENT ILLNESS: 61 y.o. female with a past medical history of prediabetes, hyperlipidemia, obesity, vulvar grade 3 neoplasia, reflux and others listed below presents for evaluation of rectal bleeding.   07/07/2018 colonoscopy with [name] For screening purposes, good bowel prep, diverticulosis in sigmoid, descending and transverse, 1 mm polyp in cecum, 3 mm polyp descending colon 1 mm polyp descending colon, 2 mm polyp hepatic flexure, pathology showed all 4 diminutive polyps tubular adenomas recall 3 years.  She states she has had hemorrhoids in her 20's, will come and go.  She has constipation since she was a kid, will take miralax, will have to do suppositories.  Has BM once a week with straining. Hard  in the beginning and then formed/mushy afterwards.  BRB on TP, in toilet and on outside of stool.  Denies AB pain.  No urinary issues, she has some dyspareunia.  No history of kids, had tummy tuck 2006.  Has been on wegovy for months.   If she does not take prilosec with have GERD, otherwise controlled. No N, V, dysphagia, melena.  Going to go to [place] in Sept  Current Medications:   Current Outpatient Medications (Endocrine & Metabolic):    thyroid (NP THYROID) 30 MG tablet, 1 tablet on an empty stomach Orally Once a day 30 day(s)   thyroid (NP THYROID) 30 MG tablet, 1 tablet on an empty stomach Orally Once a day 30 day(s)   Current Outpatient Medications (Respiratory):    loratadine (CLARITIN) 10 MG tablet, Take 10 mg by mouth daily.  Current Outpatient Medications (Analgesics):    ibuprofen (ADVIL) 800 MG tablet, Take 1 tablet (800 mg total) by mouth every 8 (eight) hours as needed.   Current Outpatient Medications (Other):    Calcium Carbonate-Vitamin D3 600-400 MG-UNIT TABS, Takes 2 tablets daily.   Cholecalciferol (VITAMIN D3) 25 MCG (1000 UT) CAPS, Take by mouth daily.   conjugated estrogens (PREMARIN) vaginal cream, 1/2 gram vaginally twice weekly   diazepam (VALIUM) 10 MG tablet, Take 1 tablet by mouth 2 hours prior to dental appointment   Multiple Vitamin (MULTIVITAMIN) tablet, Take 1 tablet by mouth daily.   Omega-3 Fatty Acids (FISH OIL) 1000 MG CAPS, 2 capsules daily.   omeprazole (PRILOSEC) 10 MG capsule, Take 20 mg by mouth daily.    Semaglutide-Weight Management (WEGOVY) 1 MG/0.5ML SOAJ, 1 mg Subcutaneous 1 week 30 days   Semaglutide-Weight Management (WEGOVY) 1  MG/0.5ML SOAJ, 1 mg Subcutaneous 1 week 30 days  Medical History:  Past Medical History:  Diagnosis Date   Abnormal Pap smear of cervix    age 68   Allergy    Cancer ([place]) 05/2020   vulvar carcinoma in situ   Chronic constipation    Depression    Endometriosis    GERD (gastroesophageal reflux  disease)    Hemorrhoids    Hyperlipidemia    Obesity    Osteopenia    Pre-diabetes    Allergies:  Allergies  Allergen Reactions   Sulfa Antibiotics Anaphylaxis   Pollen Extract      Surgical History:  She  has a past surgical history that includes Breast reduction surgery (2006); Dilation and curettage of uterus (2016); Gynecologic cryosurgery; Hysteroscopy (2006); Abdominoplasty (2006); Fracture surgery (1972); Reduction mammaplasty; Colonoscopy; and Vulva / perineum biopsy (N/A, 06/15/2020). Family History:  Her family history includes Alcohol abuse in her maternal grandfather and paternal grandfather; Arthritis in her father and paternal grandmother; Colon polyps in her father and sister; Diabetes in her mother; Eating disorder in her mother; Heart disease in her father, maternal grandmother, and paternal grandmother; Hyperlipidemia in her maternal grandmother, mother, and paternal grandmother; Hypertension in her mother; Kidney disease in her mother; Mental illness in her maternal grandmother; Obesity in her mother; Stroke in her mother; Sudden death in her father; Thyroid disease in her mother. Social History:   reports that she has never smoked. She has never used smokeless tobacco. She reports that she does not drink alcohol and does not use drugs.  REVIEW OF SYSTEMS  : All other systems reviewed and negative except where noted in the History of Present Illness.   PHYSICAL EXAM: BP 112/80   Pulse 68   Ht 5' 8.25" (1.734 m)   Wt 201 lb (91.2 kg)   LMP 01/01/2014 (Approximate)   BMI 30.34 kg/m  General:   Pleasant, well developed female in no acute distress Head:   Normocephalic and atraumatic. Eyes:  sclerae anicteric,conjunctive pink  Heart:   regular rate and rhythm Pulm:  Clear anteriorly; no wheezing Abdomen:   Soft, Obese AB, Active bowel sounds. mild tenderness in the epigastrium. Without guarding and Without rebound, No organomegaly appreciated. Rectal:  declines Extremities:  Without edema. Msk: Symmetrical without gross deformities. Peripheral pulses intact.  Neurologic:  Alert and  oriented x4;  No focal deficits.  Skin:   Dry and intact without significant lesions or rashes. Psychiatric:  Cooperative. Normal mood and affect.    Vladimir Crofts, PA-C 8:50 AM

## 2021-08-14 ENCOUNTER — Other Ambulatory Visit (HOSPITAL_COMMUNITY): Payer: Self-pay

## 2021-08-14 ENCOUNTER — Ambulatory Visit: Payer: 59 | Admitting: Physician Assistant

## 2021-08-14 VITALS — BP 112/80 | HR 68 | Ht 68.25 in | Wt 201.0 lb

## 2021-08-14 DIAGNOSIS — K219 Gastro-esophageal reflux disease without esophagitis: Secondary | ICD-10-CM

## 2021-08-14 DIAGNOSIS — Z86010 Personal history of colonic polyps: Secondary | ICD-10-CM | POA: Diagnosis not present

## 2021-08-14 DIAGNOSIS — K625 Hemorrhage of anus and rectum: Secondary | ICD-10-CM | POA: Diagnosis not present

## 2021-08-14 DIAGNOSIS — K5904 Chronic idiopathic constipation: Secondary | ICD-10-CM

## 2021-08-14 MED ORDER — NA SULFATE-K SULFATE-MG SULF 17.5-3.13-1.6 GM/177ML PO SOLN
ORAL | 0 refills | Status: DC
  Filled 2021-08-14: qty 354, 1d supply, fill #0

## 2021-08-14 NOTE — Progress Notes (Signed)
Reviewed and agree with management plans. Colonoscopy recommended.   Infant Doane L. Tarri Glenn, MD, MPH

## 2021-08-14 NOTE — Patient Instructions (Addendum)
Linzess  Take at least 30 minutes before the first meal of the day on an empty stomach You can have a loose stool if you eat a high-fat breakfast. Give it at least 4 days, may have more bowel movements during that time.  If you continue to have severe diarrhea try every other day, if you get AB pain with it stop and call our office.  After you are out we can send in a prescription if you did well, there is a prescription card  Toileting tips to help with your constipation - Drink at least 64-80 ounces of water/liquid per day. - Establish a time to try to move your bowels every day.  For many people, this is after a cup of coffee or after a meal such as breakfast. - Sit all of the way back on the toilet keeping your back fairly straight and while sitting up, try to rest the tops of your forearms on your upper thighs.   - Raising your feet with a step stool/squatty potty can be helpful to improve the angle that allows your stool to pass through the rectum. - Relax the rectum feeling it bulge toward the toilet water.  If you feel your rectum raising toward your body, you are contracting rather than relaxing. - Breathe in and slowly exhale. "Belly breath" by expanding your belly towards your belly button. Keep belly expanded as you gently direct pressure down and back to the anus.  A low pitched GRRR sound can assist with increasing intra-abdominal pressure.  - Repeat 3-4 times. If unsuccessful, contract the pelvic floor to restore normal tone and get off the toilet.  Avoid excessive straining. - To reduce excessive wiping by teaching your anus to normally contract, place hands on outer aspect of knees and resist knee movement outward.  Hold 5-10 second then place hands just inside of knees and resist inward movement of knees.  Hold 5 seconds.  Repeat a few times each way.  You have been scheduled for a colonoscopy. Please follow written instructions given to you at your visit today.  Please pick up  your prep supplies at the pharmacy within the next 1-3 days. If you use inhalers (even only as needed), please bring them with you on the day of your procedure.   Due to recent changes in healthcare laws, you may see the results of your imaging and laboratory studies on MyChart before your provider has had a chance to review them.  We understand that in some cases there may be results that are confusing or concerning to you. Not all laboratory results come back in the same time frame and the provider may be waiting for multiple results in order to interpret others.  Please give [place] 48 hours in order for your provider to thoroughly review all the results before contacting the office for clarification of your results.    I appreciate the  opportunity to care for you  Thank You   [facility]

## 2021-08-21 ENCOUNTER — Other Ambulatory Visit (HOSPITAL_COMMUNITY): Payer: Self-pay

## 2021-09-05 ENCOUNTER — Other Ambulatory Visit: Payer: Self-pay

## 2021-09-05 ENCOUNTER — Other Ambulatory Visit (HOSPITAL_COMMUNITY): Payer: Self-pay

## 2021-09-05 ENCOUNTER — Encounter: Payer: Self-pay | Admitting: Gastroenterology

## 2021-09-05 MED ORDER — LINACLOTIDE 290 MCG PO CAPS
290.0000 ug | ORAL_CAPSULE | Freq: Every day | ORAL | 0 refills | Status: DC
  Filled 2021-09-05: qty 90, 90d supply, fill #0

## 2021-09-05 MED ORDER — LINACLOTIDE 290 MCG PO CAPS: 290.0000 ug | ORAL_CAPSULE | Freq: Every day | ORAL | 0 refills | Status: DC

## 2021-09-06 ENCOUNTER — Other Ambulatory Visit (HOSPITAL_COMMUNITY): Payer: Self-pay

## 2021-09-11 ENCOUNTER — Other Ambulatory Visit (HOSPITAL_BASED_OUTPATIENT_CLINIC_OR_DEPARTMENT_OTHER): Payer: Self-pay

## 2021-09-11 DIAGNOSIS — Z6835 Body mass index (BMI) 35.0-35.9, adult: Secondary | ICD-10-CM | POA: Diagnosis not present

## 2021-09-11 DIAGNOSIS — E038 Other specified hypothyroidism: Secondary | ICD-10-CM | POA: Diagnosis not present

## 2021-09-11 DIAGNOSIS — E785 Hyperlipidemia, unspecified: Secondary | ICD-10-CM | POA: Diagnosis not present

## 2021-09-11 MED ORDER — THYROID 30 MG PO TABS
30.0000 mg | ORAL_TABLET | Freq: Every day | ORAL | 2 refills | Status: DC
  Filled 2021-09-11: qty 30, 30d supply, fill #0

## 2021-09-11 NOTE — Telephone Encounter (Signed)
Please advise reviewed with Dr. Damita Dunnings and advised to hold for PCP. See all request in my chart message.

## 2021-09-11 NOTE — Telephone Encounter (Signed)
HOLD for Anda Kraft to review once she is back in the office.

## 2021-09-12 ENCOUNTER — Other Ambulatory Visit (HOSPITAL_BASED_OUTPATIENT_CLINIC_OR_DEPARTMENT_OTHER): Payer: Self-pay

## 2021-09-18 ENCOUNTER — Encounter: Payer: Self-pay | Admitting: Primary Care

## 2021-09-18 ENCOUNTER — Other Ambulatory Visit (HOSPITAL_BASED_OUTPATIENT_CLINIC_OR_DEPARTMENT_OTHER): Payer: Self-pay

## 2021-09-18 ENCOUNTER — Other Ambulatory Visit: Payer: Self-pay | Admitting: Primary Care

## 2021-09-18 ENCOUNTER — Ambulatory Visit: Payer: 59 | Admitting: Primary Care

## 2021-09-18 VITALS — BP 122/80 | HR 76 | Temp 97.2°F | Ht 68.25 in | Wt 198.0 lb

## 2021-09-18 DIAGNOSIS — E785 Hyperlipidemia, unspecified: Secondary | ICD-10-CM

## 2021-09-18 DIAGNOSIS — D071 Carcinoma in situ of vulva: Secondary | ICD-10-CM

## 2021-09-18 DIAGNOSIS — E663 Overweight: Secondary | ICD-10-CM | POA: Diagnosis not present

## 2021-09-18 DIAGNOSIS — Z9189 Other specified personal risk factors, not elsewhere classified: Secondary | ICD-10-CM

## 2021-09-18 DIAGNOSIS — R7303 Prediabetes: Secondary | ICD-10-CM | POA: Diagnosis not present

## 2021-09-18 DIAGNOSIS — K219 Gastro-esophageal reflux disease without esophagitis: Secondary | ICD-10-CM

## 2021-09-18 DIAGNOSIS — Z23 Encounter for immunization: Secondary | ICD-10-CM

## 2021-09-18 LAB — COMPREHENSIVE METABOLIC PANEL
ALT: 21 U/L (ref 0–35)
AST: 17 U/L (ref 0–37)
Albumin: 4.5 g/dL (ref 3.5–5.2)
Alkaline Phosphatase: 80 U/L (ref 39–117)
BUN: 15 mg/dL (ref 6–23)
CO2: 27 mEq/L (ref 19–32)
Calcium: 10 mg/dL (ref 8.4–10.5)
Chloride: 103 mEq/L (ref 96–112)
Creatinine, Ser: 0.89 mg/dL (ref 0.40–1.20)
GFR: 69.25 mL/min (ref >60.00)
Glucose, Bld: 97 mg/dL (ref 70–99)
Potassium: 4.4 mEq/L (ref 3.5–5.1)
Sodium: 139 mEq/L (ref 135–145)
Total Bilirubin: 0.6 mg/dL (ref 0.2–1.2)
Total Protein: 7.3 g/dL (ref 6.0–8.3)

## 2021-09-18 LAB — LIPID PANEL
Cholesterol: 183 mg/dL (ref 0–200)
HDL: 48.5 mg/dL (ref >39.00)
LDL Cholesterol: 99 mg/dL (ref 0–99)
NonHDL: 134.54
Total CHOL/HDL Ratio: 4
Triglycerides: 180 mg/dL — ABNORMAL HIGH (ref 0.0–149.0)
VLDL: 36 mg/dL (ref 0.0–40.0)

## 2021-09-18 LAB — HEMOGLOBIN A1C: Hgb A1c MFr Bld: 5.7 % (ref 4.6–6.5)

## 2021-09-18 MED ORDER — NIRMATRELVIR/RITONAVIR (PAXLOVID)TABLET
3.0000 | ORAL_TABLET | Freq: Two times a day (BID) | ORAL | 0 refills | Status: AC
  Filled 2021-09-18: qty 30, 5d supply, fill #0

## 2021-09-18 NOTE — Progress Notes (Signed)
Subjective:    Patient ID: Tina Chavez, female    DOB: 11-Nov-1960, 61 y.o.   MRN: [id number]  HPI  Tina Chavez is a very pleasant 61 y.o. female who presents today for follow-up of chronic conditions.  She has not been seen by me since 2018.  Evaluated by Tina Netters, NP in 2021.  1) Vulvar intraepithelial neoplasm: Following with GYN. Currently managed on Premarin vaginal cream. Pap smear UTD. No recurrence since lesion was removed.   2) Chronic Constipation: Currently managed on Linzess 290 mcg for which she just began. Following with GI. Long history of constipation. She's noticed improved bowel movements to twice weekly since initiation of Linzess.  She is scheduled for colonoscopy in October 2023.   3) Obesity: Following with Tina Henri MD. Currently managed on Armour Thyroid 30 mg daily which was initiated because of weight gain. Also managed on Wegovy 1 mg weekly for which she began in Spring 2023. She endorses a 10 pound weight loss since.   4) GERD: Currently managed on omeprazole 20 mg for which she has been taking for years.  She has been well managed on omeprazole 20 mg daily, no breakthrough symptoms.  5) At Risk For Covid-19: Today she is requesting a prescription for Paxlovid to have on hand in case she contracts COVID-19 infection.  She will be traveling to [place] next week.   BP Readings from Last 3 Encounters:  09/18/21 122/80  08/14/21 112/80  06/19/21 138/82   Wt Readings from Last 3 Encounters:  09/18/21 198 lb (89.8 kg)  08/14/21 201 lb (91.2 kg)  06/19/21 203 lb 6.4 oz (92.3 kg)       Review of Systems  Respiratory:  Negative for shortness of breath.   Cardiovascular:  Negative for chest pain.  Gastrointestinal:  Positive for constipation.  Neurological:  Negative for headaches.  Psychiatric/Behavioral:  The patient is not nervous/anxious.          Past Medical History:  Diagnosis Date   Abnormal Pap smear of cervix    age 66   Allergy     Cancer ([place]) 05/2020   vulvar carcinoma in situ   Chronic constipation    Depression    Endometriosis    GERD (gastroesophageal reflux disease)    Hemorrhoids    Hyperlipidemia    Obesity    Osteopenia    Pre-diabetes     Social History   Socioeconomic History   Marital status: Married    Spouse name: Tina Chavez    Number of children: 0   Years of education: Not on file   Highest education level: Not on file  Occupational History   Occupation: Social Work   Occupation: Psychtherapist  Tobacco Use   Smoking status: Never   Smokeless tobacco: Never  Vaping Use   Vaping Use: Never used  Substance and Sexual Activity   Alcohol use: No   Drug use: No   Sexual activity: Not Currently    Birth control/protection: Post-menopausal  Other Topics Concern   Not on file  Social History Narrative   Married.   Step Children.   Works as a Transport planner.   Enjoys traveling, playing with her dogs.    Social Determinants of Health   Financial Resource Strain: Not on file  Food Insecurity: Not on file  Transportation Needs: Not on file  Physical Activity: Not on file  Stress: Not on file  Social Connections: Not on file  Intimate Partner Violence: Not  on file    Past Surgical History:  Procedure Laterality Date   ABDOMINOPLASTY  2006   Dr. Lesli Chavez   BREAST REDUCTION SURGERY  2006   COLONOSCOPY     normal- but hems in [place]- age 64    [place]  2016   [place]   Arm    GYNECOLOGIC CRYOSURGERY     at age 16   HYSTEROSCOPY  2006   REDUCTION MAMMAPLASTY     2006   VULVA /PERINEUM BIOPSY N/A 06/15/2020   Procedure: RE-EXCISION VULVAR BIOPSY-  Wide Local Excision;  Surgeon: Tina Salon, MD;  Location: [place];  Service: Gynecology;  Laterality: N/A;    Family History  Problem Relation Age of Onset   Hyperlipidemia Mother    Stroke Mother    Diabetes Mother    Hypertension Mother    Kidney disease  Mother    Thyroid disease Mother    Eating disorder Mother    Obesity Mother    Arthritis Father    Heart disease Father    Sudden death Father    Colon polyps Father    Hyperlipidemia Maternal Grandmother    Heart disease Maternal Grandmother    Mental illness Maternal Grandmother    Alcohol abuse Maternal Grandfather    Arthritis Paternal Grandmother    Hyperlipidemia Paternal Grandmother    Heart disease Paternal Grandmother    Alcohol abuse Paternal Grandfather    Colon polyps Sister    Colon cancer Neg Hx    Esophageal cancer Neg Hx    Rectal cancer Neg Hx    Stomach cancer Neg Hx     Allergies  Allergen Reactions   Sulfa Antibiotics Anaphylaxis   Pollen Extract     Current Outpatient Medications on File Prior to Visit  Medication Sig Dispense Refill   Calcium Carbonate-Vitamin D3 600-400 MG-UNIT TABS Takes 2 tablets daily.     Cholecalciferol (VITAMIN D3) 25 MCG (1000 UT) CAPS Take by mouth daily.     conjugated estrogens (PREMARIN) vaginal cream 1/2 gram vaginally twice weekly 30 g 2   diazepam (VALIUM) 10 MG tablet Take 1 tablet by mouth 2 hours prior to dental appointment 1 tablet 0   ibuprofen (ADVIL) 800 MG tablet Take 1 tablet (800 mg total) by mouth every 8 (eight) hours as needed. 30 tablet 0   linaclotide (LINZESS) 290 MCG CAPS capsule Take 1 capsule (290 mcg total) by mouth daily before breakfast. 90 capsule 0   loratadine (CLARITIN) 10 MG tablet Take 10 mg by mouth daily.     Multiple Vitamin (MULTIVITAMIN) tablet Take 1 tablet by mouth daily.     Na Sulfate-K Sulfate-Mg Sulf 17.5-3.13-1.6 GM/177ML SOLN As directed by GI office instructions 354 mL 0   Omega-3 Fatty Acids (FISH OIL) 1000 MG CAPS 2 capsules daily.     omeprazole (PRILOSEC) 10 MG capsule Take 20 mg by mouth daily.      Semaglutide-Weight Management (WEGOVY) 1 MG/0.5ML SOAJ 1 mg Subcutaneous 1 week 30 days 2 mL 1   thyroid (NP THYROID) 30 MG tablet Take 1 tablet (30 mg total) by mouth daily on  an empty stomach. 30 tablet 2   No current facility-administered medications on file prior to visit.    BP 122/80   Pulse 76   Temp (!) 97.2 F (36.2 C) (Temporal)   Ht 5' 8.25" (1.734 m)   Wt 198 lb (89.8 kg)   LMP  01/01/2014 (Approximate)   SpO2 96%   BMI 29.89 kg/m  Objective:   Physical Exam Cardiovascular:     Rate and Rhythm: Normal rate and regular rhythm.  Pulmonary:     Effort: Pulmonary effort is normal.     Breath sounds: Normal breath sounds.  Abdominal:     General: Bowel sounds are normal.     Palpations: Abdomen is soft.     Tenderness: There is no abdominal tenderness.  Musculoskeletal:     Cervical back: Neck supple.  Skin:    General: Skin is warm and dry.           Assessment & Plan:   Problem List Items Addressed This Visit       Digestive   GERD (gastroesophageal reflux disease)    Controlled.  Discussed options for weaning off of omeprazole. She will update if she decides to wean off.  Continue omeprazole 20 mg daily for now.         Genitourinary   Vulvar intraepithelial neoplasia (VIN) grade 3    Following with GYN, no lesions since initial lesion.  Continue regular GYN follow-up.        Other   Overweight (BMI 25.0-29.9)    Follow with [facility].  Continue Wegovy 1 mg weekly. Continue Armour Thyroid 30 mg daily.      Prediabetes - Primary    Repeat A1c pending.  Encouraged healthy diet, regular exercise.      Relevant Orders   Hemoglobin A1c   Hyperlipidemia    Repeat lipid panel pending.  Encourage regular exercise, continue with healthy diet.      Relevant Orders   Comprehensive metabolic panel   Lipid panel   At increased risk of exposure to COVID-19 virus    Agree to provide Paxlovid prescription to use if needed. We discussed only use if she test positive for COVID-19 and develops moderate to severe symptoms. She verbalized understanding.  We will need to update her renal function, CMP pending  today. Once we have labs that we can provide her with the prescription.          [name], NP

## 2021-09-18 NOTE — Assessment & Plan Note (Signed)
Controlled.  Discussed options for weaning off of omeprazole. She will update if she decides to wean off.  Continue omeprazole 20 mg daily for now.

## 2021-09-18 NOTE — Assessment & Plan Note (Signed)
Follow with [facility].  Continue Wegovy 1 mg weekly. Continue Armour Thyroid 30 mg daily.

## 2021-09-18 NOTE — Assessment & Plan Note (Signed)
Repeat lipid panel pending.  Encourage regular exercise, continue with healthy diet.

## 2021-09-18 NOTE — Assessment & Plan Note (Signed)
Following with GYN, no lesions since initial lesion.  Continue regular GYN follow-up.

## 2021-09-18 NOTE — Patient Instructions (Signed)
Stop by the lab prior to leaving today. I will notify you of your results once received.   Consider weaning off of omeprazole by taking 20 mg every other day, or switching to famotidine 20 mg daily.  It was a pleasure to see you today!

## 2021-09-18 NOTE — Assessment & Plan Note (Signed)
Agree to provide Paxlovid prescription to use if needed. We discussed only use if she test positive for COVID-19 and develops moderate to severe symptoms. She verbalized understanding.  We will need to update her renal function, CMP pending today. Once we have labs that we can provide her with the prescription.

## 2021-09-18 NOTE — Assessment & Plan Note (Signed)
Repeat A1c pending.  Encouraged healthy diet, regular exercise.

## 2021-09-19 ENCOUNTER — Other Ambulatory Visit (HOSPITAL_BASED_OUTPATIENT_CLINIC_OR_DEPARTMENT_OTHER): Payer: Self-pay

## 2021-10-09 ENCOUNTER — Other Ambulatory Visit (HOSPITAL_BASED_OUTPATIENT_CLINIC_OR_DEPARTMENT_OTHER): Payer: Self-pay

## 2021-10-09 DIAGNOSIS — N951 Menopausal and female climacteric states: Secondary | ICD-10-CM | POA: Diagnosis not present

## 2021-10-09 DIAGNOSIS — E038 Other specified hypothyroidism: Secondary | ICD-10-CM | POA: Diagnosis not present

## 2021-10-09 MED ORDER — THYROID 30 MG PO TABS
30.0000 mg | ORAL_TABLET | Freq: Every day | ORAL | 2 refills | Status: DC
  Filled 2021-10-09: qty 30, 30d supply, fill #0

## 2021-10-09 MED ORDER — THYROID 15 MG PO TABS
15.0000 mg | ORAL_TABLET | Freq: Every day | ORAL | 2 refills | Status: DC
  Filled 2021-10-09: qty 30, 30d supply, fill #0

## 2021-10-10 ENCOUNTER — Other Ambulatory Visit (HOSPITAL_BASED_OUTPATIENT_CLINIC_OR_DEPARTMENT_OTHER): Payer: Self-pay

## 2021-10-23 ENCOUNTER — Ambulatory Visit (AMBULATORY_SURGERY_CENTER): Payer: 59 | Admitting: Gastroenterology

## 2021-10-23 ENCOUNTER — Other Ambulatory Visit (HOSPITAL_COMMUNITY): Payer: Self-pay

## 2021-10-23 ENCOUNTER — Encounter: Payer: Self-pay | Admitting: Gastroenterology

## 2021-10-23 VITALS — BP 131/60 | HR 67 | Temp 98.9°F | Resp 16 | Ht 68.0 in | Wt 201.0 lb

## 2021-10-23 DIAGNOSIS — D123 Benign neoplasm of transverse colon: Secondary | ICD-10-CM | POA: Diagnosis not present

## 2021-10-23 DIAGNOSIS — D122 Benign neoplasm of ascending colon: Secondary | ICD-10-CM

## 2021-10-23 DIAGNOSIS — K5904 Chronic idiopathic constipation: Secondary | ICD-10-CM

## 2021-10-23 DIAGNOSIS — F32A Depression, unspecified: Secondary | ICD-10-CM | POA: Diagnosis not present

## 2021-10-23 DIAGNOSIS — Z09 Encounter for follow-up examination after completed treatment for conditions other than malignant neoplasm: Secondary | ICD-10-CM

## 2021-10-23 DIAGNOSIS — R7303 Prediabetes: Secondary | ICD-10-CM | POA: Diagnosis not present

## 2021-10-23 DIAGNOSIS — Z86010 Personal history of colonic polyps: Secondary | ICD-10-CM

## 2021-10-23 DIAGNOSIS — E669 Obesity, unspecified: Secondary | ICD-10-CM | POA: Diagnosis not present

## 2021-10-23 DIAGNOSIS — K625 Hemorrhage of anus and rectum: Secondary | ICD-10-CM

## 2021-10-23 HISTORY — PX: COLONOSCOPY: SHX174

## 2021-10-23 MED ORDER — HYDROCORTISONE (PERIANAL) 2.5 % EX CREA
1.0000 | TOPICAL_CREAM | Freq: Two times a day (BID) | CUTANEOUS | 1 refills | Status: AC
  Filled 2021-10-23: qty 30, 10d supply, fill #0

## 2021-10-23 MED ORDER — SODIUM CHLORIDE 0.9 % IV SOLN: 500.0000 mL | Freq: Once | INTRAVENOUS | Status: DC

## 2021-10-23 NOTE — Progress Notes (Signed)
Pt's states no medical or surgical changes since previsit or office visit. 

## 2021-10-23 NOTE — Progress Notes (Signed)
Referring Provider: [name], NP Primary Care Physician:  [name], NP  Indication for Procedure:  Colon cancer Surveillance   IMPRESSION:  History of colon polyps Need for colon cancer surveillance Appropriate candidate for monitored anesthesia care  PLAN: Colonoscopy in the [facility] today   HPI: Tina Chavez is a 61 y.o. female presents for surveillance colonoscopy.  Prior endoscopic history: Colonoscopy 07/07/2018: 4 tubular adenomas, diverticulosis  Recent rectal bleeding attributed to hemorrhoids in the setting of chronic idiopathic constipation.   No known family history of colon cancer or polyps. No family history of uterine/endometrial cancer, pancreatic cancer or gastric/stomach cancer.   Past Medical History:  Diagnosis Date   Abnormal Pap smear of cervix    age 44   Allergy    Cancer ([facility]) 05/2020   vulvar carcinoma in situ   Chronic constipation    Depression    Endometriosis    GERD (gastroesophageal reflux disease)    Hemorrhoids    Hyperlipidemia    Obesity    Osteopenia    Pre-diabetes     Past Surgical History:  Procedure Laterality Date   ABDOMINOPLASTY  2006   Dr. Lesli Albee   BREAST REDUCTION SURGERY  2006   COLONOSCOPY     normal- but hems in [place]- age 70    COLONOSCOPY  07/07/2018   COLONOSCOPY  10/23/2021   DILATION AND CURETTAGE OF UTERUS  2016   FRACTURE SURGERY  1972   Arm    GYNECOLOGIC CRYOSURGERY     at age 38   HYSTEROSCOPY  2006   REDUCTION MAMMAPLASTY     2006   VULVA /PERINEUM BIOPSY N/A 06/15/2020   Procedure: RE-EXCISION VULVAR BIOPSY-  Wide Local Excision;  Surgeon: Megan Salon, MD;  Location[facility];  Service: Gynecology;  Laterality: N/A;    Current Outpatient Medications  Medication Sig Dispense Refill   Calcium Carbonate-Vitamin D3 600-400 MG-UNIT TABS Takes 2 tablets daily.     Cholecalciferol (VITAMIN D3) 25 MCG (1000 UT) CAPS Take by mouth daily.     linaclotide  (LINZESS) 290 MCG CAPS capsule Take 1 capsule (290 mcg total) by mouth daily before breakfast. 90 capsule 0   loratadine (CLARITIN) 10 MG tablet Take 10 mg by mouth daily.     Multiple Vitamin (MULTIVITAMIN) tablet Take 1 tablet by mouth daily.     Omega-3 Fatty Acids (FISH OIL) 1000 MG CAPS 2 capsules daily.     omeprazole (PRILOSEC) 10 MG capsule Take 20 mg by mouth daily.      thyroid (NP THYROID) 15 MG tablet Take 1 tablet (15 mg total) by mouth daily on an empty stomach. 30 tablet 2   thyroid (NP THYROID) 30 MG tablet Take 1 tablet (30 mg total) by mouth daily on an empty stomach. 30 tablet 2   conjugated estrogens (PREMARIN) vaginal cream 1/2 gram vaginally twice weekly 30 g 2   diazepam (VALIUM) 10 MG tablet Take 1 tablet by mouth 2 hours prior to dental appointment 1 tablet 0   ibuprofen (ADVIL) 800 MG tablet Take 1 tablet (800 mg total) by mouth every 8 (eight) hours as needed. 30 tablet 0   Semaglutide-Weight Management (WEGOVY) 1 MG/0.5ML SOAJ 1 mg Subcutaneous 1 week 30 days 2 mL 1   Current Facility-Administered Medications  Medication Dose Route Frequency Provider Last Rate Last Admin   0.9 %  sodium chloride infusion  500 mL Intravenous Once Thornton Park, MD  Allergies as of 10/23/2021 - Review Complete 10/23/2021  Allergen Reaction Noted   Sulfa antibiotics Anaphylaxis 06/07/2016   Pollen extract  07/16/2014    Family History  Problem Relation Age of Onset   Hyperlipidemia Mother    Stroke Mother    Diabetes Mother    Hypertension Mother    Kidney disease Mother    Thyroid disease Mother    Eating disorder Mother    Obesity Mother    Arthritis Father    Heart disease Father    Sudden death Father    Colon polyps Father    Hyperlipidemia Maternal Grandmother    Heart disease Maternal Grandmother    Mental illness Maternal Grandmother    Alcohol abuse Maternal Grandfather    Arthritis Paternal Grandmother    Hyperlipidemia Paternal Grandmother     Heart disease Paternal Grandmother    Alcohol abuse Paternal Grandfather    Colon polyps Sister    Colon cancer Neg Hx    Esophageal cancer Neg Hx    Rectal cancer Neg Hx    Stomach cancer Neg Hx      Physical Exam: General:   Alert,  well-nourished, pleasant and cooperative in NAD Head:  Normocephalic and atraumatic. Eyes:  Sclera clear, no icterus.   Conjunctiva pink. Mouth:  No deformity or lesions.   Neck:  Supple; no masses or thyromegaly. Lungs:  Clear throughout to auscultation.   No wheezes. Heart:  Regular rate and rhythm; no murmurs. Abdomen:  Soft, non-tender, nondistended, normal bowel sounds, no rebound or guarding.  Msk:  Symmetrical. No boney deformities LAD: No inguinal or umbilical LAD Extremities:  No clubbing or edema. Neurologic:  Alert and  oriented x4;  grossly nonfocal Skin:  No obvious rash or bruise. Psych:  Alert and cooperative. Normal mood and affect.     Studies/Results: No results found.    Dwayna Kentner L. Tarri Glenn, MD, MPH 10/23/2021, 1:21 PM

## 2021-10-23 NOTE — Patient Instructions (Signed)
Handouts on polyps and diverticulosis given to you today  Await pathology results   YOU HAD AN ENDOSCOPIC PROCEDURE TODAY AT THE [facility]:   Refer to the procedure report that was given to you for any specific questions about what was found during the examination.  If the procedure report does not answer your questions, please call your gastroenterologist to clarify.  If you requested that your care partner not be given the details of your procedure findings, then the procedure report has been included in a sealed envelope for you to review at your convenience later.  YOU SHOULD EXPECT: Some feelings of bloating in the abdomen. Passage of more gas than usual.  Walking can help get rid of the air that was put into your GI tract during the procedure and reduce the bloating. If you had a lower endoscopy (such as a colonoscopy or flexible sigmoidoscopy) you may notice spotting of blood in your stool or on the toilet paper. If you underwent a bowel prep for your procedure, you may not have a normal bowel movement for a few days.  Please Note:  You might notice some irritation and congestion in your nose or some drainage.  This is from the oxygen used during your procedure.  There is no need for concern and it should clear up in a day or so.  SYMPTOMS TO REPORT IMMEDIATELY:  Following lower endoscopy (colonoscopy or flexible sigmoidoscopy):  Excessive amounts of blood in the stool  Significant tenderness or worsening of abdominal pains  Swelling of the abdomen that is new, acute  Fever of 100F or higher  For urgent or emergent issues, a gastroenterologist can be reached at any hour by calling [phone]. Do not use MyChart messaging for urgent concerns.    DIET:  We do recommend a small meal at first, but then you may proceed to your regular diet.  Drink plenty of fluids but you should avoid alcoholic beverages for 24 hours.  ACTIVITY:  You should plan to take it easy for the  rest of today and you should NOT DRIVE or use heavy machinery until tomorrow (because of the sedation medicines used during the test).    FOLLOW UP: Our staff will call the number listed on your records the next business day following your procedure.  We will call around 7:15- 8:00 am to check on you and address any questions or concerns that you may have regarding the information given to you following your procedure. If we do not reach you, we will leave a message.     If any biopsies were taken you will be contacted by phone or by letter within the next 1-3 weeks.  Please call us at [phone] if you have not heard about the biopsies in 3 weeks.    SIGNATURES/CONFIDENTIALITY: You and/or your care partner have signed paperwork which will be entered into your electronic medical record.  These signatures attest to the fact that that the information above on your After Visit Summary has been reviewed and is understood.  Full responsibility of the confidentiality of this discharge information lies with you and/or your care-partner. 

## 2021-10-23 NOTE — Op Note (Signed)
[facility] Patient Name: Geniyah Eischeid Procedure Date: 10/23/2021 1:31 PM MRN: [id number] Endoscopist: Thornton Park MD, MD Age: 61 Referring MD:  Date of Birth: [birth year] Gender: Female Account #: [id number] Procedure:                Colonoscopy Indications:              High risk colon cancer surveillance: Personal                            history of multiple (3 or more) adenomas                           4 tubular adenomas removed on colonoscopy 2020 Medicines:                Monitored Anesthesia Care Procedure:                Pre-Anesthesia Assessment:                           - Prior to the procedure, a History and Physical                            was performed, and patient medications and                            allergies were reviewed. The patient's tolerance of                            previous anesthesia was also reviewed. The risks                            and benefits of the procedure and the sedation                            options and risks were discussed with the patient.                            All questions were answered, and informed consent                            was obtained. Prior Anticoagulants: The patient has                            taken no previous anticoagulant or antiplatelet                            agents. ASA Grade Assessment: II - A patient with                            mild systemic disease. After reviewing the risks                            and benefits, the patient was deemed in  satisfactory condition to undergo the procedure.                           After obtaining informed consent, the colonoscope                            was passed under direct vision. Throughout the                            procedure, the patient's blood pressure, pulse, and                            oxygen saturations were monitored continuously. The                            CF [id number] #[id number] was  introduced through the anus                            and advanced to the the cecum, identified by                            appendiceal orifice and ileocecal valve. The                            colonoscopy was performed without difficulty. The                            patient tolerated the procedure well. The quality                            of the bowel preparation was good. The terminal                            ileum, ileocecal valve, appendiceal orifice, and                            rectum were photographed. Scope In: 1:35:07 PM Scope Out: 1:52:29 PM Scope Withdrawal Time: 0 hours 11 minutes 53 seconds  Total Procedure Duration: 0 hours 17 minutes 22 seconds  Findings:                 Non-bleeding external and internal hemorrhoids were                            found.                           A few small and large-mouthed diverticula were                            found in the sigmoid colon, descending colon,                            transverse colon and ascending colon.  Two sessile polyps were found in the transverse                            colon and ascending colon. The polyps were 2 to 3                            mm in size. These polyps were removed with a cold                            snare. Resection and retrieval were complete.                            Estimated blood loss was minimal.                           The exam was otherwise without abnormality on                            direct and retroflexion views. Complications:            No immediate complications. Estimated Blood Loss:     Estimated blood loss was minimal. Impression:               - Non-bleeding external and internal hemorrhoids.                           - Pancolonic diverticulosis.                           - Two 2 to 3 mm polyps in the transverse colon and                            in the ascending colon, removed with a cold snare.                             Resected and retrieved.                           - The examination was otherwise normal on direct                            and retroflexion views. Recommendation:           - Patient has a contact number available for                            emergencies. The signs and symptoms of potential                            delayed complications were discussed with the                            patient. Return to normal activities tomorrow.  Written discharge instructions were provided to the                            patient.                           - Resume previous diet.                           - Continue present medications.                           - Await pathology results.                           - Repeat colonoscopy in 5 years for surveillance.                           - Use Anusol HC 2.5% applied sparingly to your                            rectum twice daily for hemorrhoidal bleeding.                           - If you would like more information,                            MyGIHealth.com and UpToDate.com have good                            information about hemorrhoids.                           - Emerging evidence supports eating a diet of                            fruits, vegetables, grains, calcium, and yogurt                            while reducing red meat and alcohol may reduce the                            risk of colon cancer. Thornton Park MD, MD 10/23/2021 1:59:01 PM This report has been signed electronically.

## 2021-10-23 NOTE — Progress Notes (Signed)
To pacu, VSS. Report to [email or web address] 

## 2021-10-24 ENCOUNTER — Telehealth: Payer: Self-pay

## 2021-10-24 NOTE — Telephone Encounter (Signed)
  Follow up Call-     10/23/2021   12:54 PM  Call back number  Post procedure Call Back phone  # [phone]  Permission to leave phone message Yes     Patient questions:  Do you have a fever, pain , or abdominal swelling? No. Pain Score  0 *  Have you tolerated food without any problems? Yes.    Have you been able to return to your normal activities? Yes.    Do you have any questions about your discharge instructions: Diet   No. Medications  No. Follow up visit  No.  Do you have questions or concerns about your Care? No.  Actions: * If pain score is 4 or above: No action needed, pain <4.

## 2021-11-06 DIAGNOSIS — E038 Other specified hypothyroidism: Secondary | ICD-10-CM | POA: Diagnosis not present

## 2021-11-09 NOTE — Telephone Encounter (Signed)
Post procedure follow up call

## 2021-11-20 ENCOUNTER — Encounter: Payer: Self-pay | Admitting: Gastroenterology

## 2021-11-30 ENCOUNTER — Other Ambulatory Visit: Payer: Self-pay | Admitting: Physician Assistant

## 2021-11-30 ENCOUNTER — Other Ambulatory Visit (HOSPITAL_COMMUNITY): Payer: Self-pay

## 2021-11-30 MED ORDER — LINACLOTIDE 290 MCG PO CAPS
290.0000 ug | ORAL_CAPSULE | Freq: Every day | ORAL | 2 refills | Status: DC
  Filled 2021-11-30: qty 90, 90d supply, fill #0

## 2021-12-05 ENCOUNTER — Encounter (HOSPITAL_COMMUNITY): Payer: Self-pay

## 2021-12-05 ENCOUNTER — Other Ambulatory Visit (HOSPITAL_COMMUNITY): Payer: Self-pay

## 2021-12-14 ENCOUNTER — Other Ambulatory Visit (HOSPITAL_COMMUNITY): Payer: Self-pay

## 2021-12-14 MED ORDER — DIAZEPAM 10 MG PO TABS
10.0000 mg | ORAL_TABLET | ORAL | 0 refills | Status: DC
  Filled 2021-12-14: qty 1, 1d supply, fill #0

## 2022-01-08 ENCOUNTER — Encounter (HOSPITAL_BASED_OUTPATIENT_CLINIC_OR_DEPARTMENT_OTHER): Payer: Self-pay | Admitting: Obstetrics & Gynecology

## 2022-01-08 ENCOUNTER — Ambulatory Visit (HOSPITAL_BASED_OUTPATIENT_CLINIC_OR_DEPARTMENT_OTHER): Payer: Commercial Managed Care - PPO | Admitting: Obstetrics & Gynecology

## 2022-01-08 ENCOUNTER — Other Ambulatory Visit (HOSPITAL_COMMUNITY)
Admission: RE | Admit: 2022-01-08 | Discharge: 2022-01-08 | Disposition: A | Discharge status: Home / Self Care | Payer: Commercial Managed Care - PPO | Source: Ambulatory Visit | Attending: Obstetrics & Gynecology | Admitting: Obstetrics & Gynecology

## 2022-01-08 VITALS — BP 129/88 | HR 66 | Ht 62.0 in | Wt 208.6 lb

## 2022-01-08 DIAGNOSIS — D071 Carcinoma in situ of vulva: Secondary | ICD-10-CM

## 2022-01-08 DIAGNOSIS — Z87412 Personal history of vulvar dysplasia: Secondary | ICD-10-CM | POA: Diagnosis not present

## 2022-01-08 DIAGNOSIS — L989 Disorder of the skin and subcutaneous tissue, unspecified: Secondary | ICD-10-CM | POA: Diagnosis not present

## 2022-01-08 NOTE — Progress Notes (Signed)
GYNECOLOGY  VISIT  CC:   recheck, h/o VIN 3  HPI: 62 y.o. G8P0020 Married White or Caucasian female here for vulvar recheck due to hx of VIN3.  Had excision of lesion.  Has not noticed any new vulvar lesions but does have a lesion on her thigh she is concerned about.  Wants me to look at this today.  Denies itching or bleeding from lesion.   Past Medical History:  Diagnosis Date   Abnormal Pap smear of cervix    age 1   Allergy    Cancer ([facility]) 05/2020   vulvar carcinoma in situ   Chronic constipation    Depression    Endometriosis    GERD (gastroesophageal reflux disease)    Hemorrhoids    Hyperlipidemia    Obesity    Osteopenia    Pre-diabetes     MEDS:   Current Outpatient Medications on File Prior to Visit  Medication Sig Dispense Refill   Calcium Carbonate-Vitamin D3 600-400 MG-UNIT TABS Takes 2 tablets daily.     Cholecalciferol (VITAMIN D3) 25 MCG (1000 UT) CAPS Take by mouth daily.     conjugated estrogens (PREMARIN) vaginal cream 1/2 gram vaginally twice weekly 30 g 2   ibuprofen (ADVIL) 800 MG tablet Take 1 tablet (800 mg total) by mouth every 8 (eight) hours as needed. 30 tablet 0   linaclotide (LINZESS) 290 MCG CAPS capsule Take 1 capsule (290 mcg total) by mouth daily before breakfast. 90 capsule 2   loratadine (CLARITIN) 10 MG tablet Take 10 mg by mouth daily.     Multiple Vitamin (MULTIVITAMIN) tablet Take 1 tablet by mouth daily.     Omega-3 Fatty Acids (FISH OIL) 1000 MG CAPS 2 capsules daily.     omeprazole (PRILOSEC) 10 MG capsule Take 20 mg by mouth daily.      diazepam (VALIUM) 10 MG tablet Take 1 tablet by mouth 2 hours prior to dental appointment (Patient not taking: Reported on 01/08/2022) 1 tablet 0   diazepam (VALIUM) 10 MG tablet Take 1 tablet (10 mg total) by mouth 2 hours before dental appointment (Patient not taking: Reported on 01/08/2022) 1 tablet 0   hydrocortisone (ANUSOL-HC) 2.5 % rectal cream Place 1 Application rectally 2 (two) times daily.  (Patient not taking: Reported on 01/08/2022) 30 g 1   No current facility-administered medications on file prior to visit.    ALLERGIES: Sulfa antibiotics and Pollen extract  SH:  married, non smoker  Review of Systems  Constitutional: Negative.   Genitourinary: Negative.     PHYSICAL EXAMINATION:    BP 129/88   Pulse 66   Ht '5\' 2"'$  (1.575 m)   Wt 208 lb 9.6 oz (94.6 kg)   LMP 01/01/2014 (Approximate)   BMI 38.15 kg/m     General appearance: alert, cooperative and appears stated age Lymph:  no inguinal LAD noted Skin:  irregular, pigmented lesion on right thigh  Pelvic: External genitalia:  no lesions              Urethra:  normal appearing urethra with no masses, tenderness or lesions              Bartholins and Skenes: normal                  Discussed with pt skin bx.  Consent obtained.   Procedure:  Area cleansed with Betadine.  Sterile technique used throughout procedure.  Skin anesthestized with Lidocaine 1% plain; 1.78m.  356mpunch biopsy  used to obtain specimen.  Biopsy grasped with pick-ups and excised with scissors.  Adequate hemostasis obtained with silver nitrate sticks.  Dressing was applied.  Pt tolerated procedure well.  Chaperone, Ezekiel Ina, RN, was present for exam.  Assessment/Plan: 1. Skin lesion - biopsy obtained today - Surgical pathology( [facility])  2. VIN III (vulvar intraepithelial neoplasia III) - no abnormal skin changes noted today

## 2022-01-15 ENCOUNTER — Encounter (HOSPITAL_BASED_OUTPATIENT_CLINIC_OR_DEPARTMENT_OTHER): Payer: Self-pay | Admitting: Obstetrics & Gynecology

## 2022-01-17 ENCOUNTER — Encounter (HOSPITAL_BASED_OUTPATIENT_CLINIC_OR_DEPARTMENT_OTHER): Payer: Self-pay | Admitting: Obstetrics & Gynecology

## 2022-01-17 LAB — SURGICAL PATHOLOGY

## 2022-04-17 ENCOUNTER — Other Ambulatory Visit: Payer: Self-pay | Admitting: Physical Medicine and Rehabilitation

## 2022-04-17 DIAGNOSIS — Z1231 Encounter for screening mammogram for malignant neoplasm of breast: Secondary | ICD-10-CM

## 2022-04-23 ENCOUNTER — Other Ambulatory Visit (HOSPITAL_COMMUNITY): Payer: Self-pay

## 2022-04-23 ENCOUNTER — Other Ambulatory Visit: Payer: Self-pay

## 2022-04-23 MED ORDER — DIAZEPAM 10 MG PO TABS
10.0000 mg | ORAL_TABLET | ORAL | 0 refills | Status: DC
  Filled 2022-04-23: qty 1, 1d supply, fill #0

## 2022-04-24 ENCOUNTER — Other Ambulatory Visit (HOSPITAL_COMMUNITY): Payer: Self-pay

## 2022-06-04 ENCOUNTER — Ambulatory Visit: Payer: Commercial Managed Care - PPO

## 2022-06-25 ENCOUNTER — Ambulatory Visit
Admission: RE | Admit: 2022-06-25 | Discharge: 2022-06-25 | Disposition: A | Discharge status: Home / Self Care | Payer: Commercial Managed Care - PPO | Source: Ambulatory Visit | Attending: Physical Medicine and Rehabilitation | Admitting: Physical Medicine and Rehabilitation

## 2022-06-25 ENCOUNTER — Ambulatory Visit (INDEPENDENT_AMBULATORY_CARE_PROVIDER_SITE_OTHER): Payer: Commercial Managed Care - PPO | Admitting: Obstetrics & Gynecology

## 2022-06-25 ENCOUNTER — Encounter (HOSPITAL_BASED_OUTPATIENT_CLINIC_OR_DEPARTMENT_OTHER): Payer: Self-pay | Admitting: Obstetrics & Gynecology

## 2022-06-25 ENCOUNTER — Other Ambulatory Visit (HOSPITAL_BASED_OUTPATIENT_CLINIC_OR_DEPARTMENT_OTHER): Payer: Self-pay

## 2022-06-25 VITALS — BP 127/77 | HR 68 | Ht 62.0 in | Wt 210.0 lb

## 2022-06-25 DIAGNOSIS — N952 Postmenopausal atrophic vaginitis: Secondary | ICD-10-CM

## 2022-06-25 DIAGNOSIS — D071 Carcinoma in situ of vulva: Secondary | ICD-10-CM

## 2022-06-25 DIAGNOSIS — Z01419 Encounter for gynecological examination (general) (routine) without abnormal findings: Secondary | ICD-10-CM

## 2022-06-25 DIAGNOSIS — Z780 Asymptomatic menopausal state: Secondary | ICD-10-CM | POA: Diagnosis not present

## 2022-06-25 DIAGNOSIS — M8588 Other specified disorders of bone density and structure, other site: Secondary | ICD-10-CM | POA: Diagnosis not present

## 2022-06-25 DIAGNOSIS — Z1231 Encounter for screening mammogram for malignant neoplasm of breast: Secondary | ICD-10-CM

## 2022-06-25 MED ORDER — PREMARIN 0.625 MG/GM VA CREA
TOPICAL_CREAM | VAGINAL | 2 refills | Status: AC
Start: 2022-06-25 — End: ?
  Filled 2022-06-25: qty 30, 30d supply, fill #0

## 2022-06-25 NOTE — Progress Notes (Signed)
62 y.o. G56P0020 Married White or Caucasian female here for annual exam.  Doing well.  Busy at work.  Denies vaginal bleeding.    Has bought a house in [place], [place].  Is updating it.  This is her their retirement home.    H/o vulvar dysplasia.    Patient's last menstrual period was 01/01/2014 (approximate).          Sexually active: No.  The current method of family planning is post menopausal status.    Exercising: No.  Smoker:  no  Health Maintenance: Pap:  06/19/21 neg History of abnormal Pap:  remote hx MMG:  scheduled today, last was 05/01/2021 Colonoscopy:  10/23/21, follow up 5 years BMD:   04/27/20, T score -1.6 in spine Screening Labs: 09/18/2021   reports that she has never smoked. She has never used smokeless tobacco. She reports that she does not drink alcohol and does not use drugs.  Past Medical History:  Diagnosis Date   Abnormal Pap smear of cervix    age 46   Allergy    Cancer ([facility]) 05/2020   vulvar carcinoma in situ   Chronic constipation    Depression    Endometriosis    GERD (gastroesophageal reflux disease)    Hemorrhoids    Hyperlipidemia    Obesity    Osteopenia    Pre-diabetes     Past Surgical History:  Procedure Laterality Date   ABDOMINOPLASTY  2006   Dr. Nancy Nordmann   BREAST REDUCTION SURGERY  2006   COLONOSCOPY     normal- but hems in [place]- age 61    COLONOSCOPY  07/07/2018   COLONOSCOPY  10/23/2021   DILATION AND CURETTAGE OF UTERUS  2016   FRACTURE SURGERY  1972   Arm    GYNECOLOGIC CRYOSURGERY     at age 25   HYSTEROSCOPY  2006   REDUCTION MAMMAPLASTY     2006   VULVA /PERINEUM BIOPSY N/A 06/15/2020   Procedure: RE-EXCISION VULVAR BIOPSY-  Wide Local Excision;  Surgeon: Jerene Bears, MD;  Location: [facility];  Service: Gynecology;  Laterality: N/A;    Current Outpatient Medications  Medication Sig Dispense Refill   CALCIUM PO Take by mouth.     Cholecalciferol (VITAMIN D3) 25 MCG (1000 UT) CAPS Take by mouth  daily.     ibuprofen (ADVIL) 800 MG tablet Take 1 tablet (800 mg total) by mouth every 8 (eight) hours as needed. 30 tablet 0   loratadine (CLARITIN) 10 MG tablet Take 10 mg by mouth daily.     Multiple Vitamin (MULTIVITAMIN) tablet Take 1 tablet by mouth daily.     Omega-3 Fatty Acids (FISH OIL) 1000 MG CAPS 2 capsules daily.     omeprazole (PRILOSEC) 10 MG capsule Take 20 mg by mouth daily.      conjugated estrogens (PREMARIN) vaginal cream 1/2 gram vaginally twice weekly 30 g 2   diazepam (VALIUM) 10 MG tablet Take 1 tablet by mouth 2 hours prior to dental appointment (Patient not taking: Reported on 01/08/2022) 1 tablet 0   diazepam (VALIUM) 10 MG tablet Take 1 tablet (10 mg total) by mouth 2 hours before dental appointment (Patient not taking: Reported on 01/08/2022) 1 tablet 0   diazepam (VALIUM) 10 MG tablet Take 1 tablet by mouth 1 hour prior to dental appointment. Must have driver. (Patient not taking: Reported on 06/25/2022) 1 tablet 0   hydrocortisone (ANUSOL-HC) 2.5 % rectal cream Place 1 Application rectally 2 (two)  times daily. (Patient not taking: Reported on 01/08/2022) 30 g 1   linaclotide (LINZESS) 290 MCG CAPS capsule Take 1 capsule (290 mcg total) by mouth daily before breakfast. (Patient not taking: Reported on 06/25/2022) 90 capsule 2   No current facility-administered medications for this visit.    Family History  Problem Relation Age of Onset   Hyperlipidemia Mother    Stroke Mother    Diabetes Mother    Hypertension Mother    Kidney disease Mother    Thyroid disease Mother    Eating disorder Mother    Obesity Mother    Arthritis Father    Heart disease Father    Sudden death Father    Colon polyps Father    Hyperlipidemia Maternal Grandmother    Heart disease Maternal Grandmother    Mental illness Maternal Grandmother    Alcohol abuse Maternal Grandfather    Arthritis Paternal Grandmother    Hyperlipidemia Paternal Grandmother    Heart disease Paternal Grandmother     Alcohol abuse Paternal Grandfather    Colon polyps Sister    Colon cancer Neg Hx    Esophageal cancer Neg Hx    Rectal cancer Neg Hx    Stomach cancer Neg Hx     ROS: Constitutional: negative Genitourinary:negative  Exam:   BP 127/77   Pulse 68   Ht 5\' 2"  (1.575 m)   Wt 210 lb (95.3 kg)   LMP 01/01/2014 (Approximate)   BMI 38.41 kg/m   Height: 5\' 2"  (157.5 cm)  General appearance: alert, cooperative and appears stated age Head: Normocephalic, without obvious abnormality, atraumatic Neck: no adenopathy, supple, symmetrical, trachea midline and thyroid normal to inspection and palpation Lungs: clear to auscultation bilaterally Breasts: normal appearance, no masses or tenderness Heart: regular rate and rhythm Abdomen: soft, non-tender; bowel sounds normal; no masses,  no organomegaly Extremities: extremities normal, atraumatic, no cyanosis or edema Skin: Skin color, texture, turgor normal. No rashes or lesions Lymph nodes: Cervical, supraclavicular, and axillary nodes normal. No abnormal inguinal nodes palpated Neurologic: Grossly normal   Pelvic: External genitalia:  no lesions              Urethra:  normal appearing urethra with no masses, tenderness or lesions              Bartholins and Skenes: normal                 Vagina: normal appearing vagina with normal color and no discharge, no lesions              Cervix: no lesions              Pap taken: No. Bimanual Exam:  Uterus:  normal size, contour, position, consistency, mobility, non-tender              Adnexa: normal adnexa and no mass, fullness, tenderness               Rectovaginal: Confirms               Anus:  normal sphincter tone, no lesions  Chaperone, Raechel Ache, RN, was present for exam.  Assessment/Plan: 1. Well woman exam with routine gynecological exam - Pap smear neg 2023 with neg HR HPV.  Not indicated today. - Mammogram scheduled this afternoon - Colonoscopy 10/23/2021.  Follow up 5 years -  Bone mineral density 2022 - lab work done with PCP, Dr. Chestine Spore - vaccines reviewed/updated  2. Vaginal atrophy - conjugated estrogens (  PREMARIN) vaginal cream; Insert 1/2 gram vaginally twice weekly  Dispense: 30 g; Refill: 2  3. Postmenopausal - not on HRT  4. Carcinoma in situ of vulva - normal vulvar exam today  5. Osteopenia of lumbar spine - plan to repeat BMD in another 1-2 years

## 2022-09-11 ENCOUNTER — Other Ambulatory Visit (HOSPITAL_COMMUNITY): Payer: Self-pay

## 2022-09-11 MED ORDER — DIAZEPAM 10 MG PO TABS
10.0000 mg | ORAL_TABLET | Freq: Once | ORAL | 0 refills | Status: AC
  Filled 2022-09-11: qty 1, 1d supply, fill #0

## 2022-09-12 ENCOUNTER — Other Ambulatory Visit (HOSPITAL_COMMUNITY): Payer: Self-pay

## 2022-12-10 ENCOUNTER — Ambulatory Visit (HOSPITAL_BASED_OUTPATIENT_CLINIC_OR_DEPARTMENT_OTHER): Payer: Commercial Managed Care - PPO | Admitting: Obstetrics & Gynecology

## 2022-12-17 ENCOUNTER — Ambulatory Visit (HOSPITAL_BASED_OUTPATIENT_CLINIC_OR_DEPARTMENT_OTHER): Payer: Self-pay | Admitting: Obstetrics & Gynecology

## 2023-01-01 ENCOUNTER — Ambulatory Visit (HOSPITAL_BASED_OUTPATIENT_CLINIC_OR_DEPARTMENT_OTHER): Payer: Commercial Managed Care - PPO | Admitting: Obstetrics & Gynecology

## 2023-01-01 ENCOUNTER — Other Ambulatory Visit (HOSPITAL_BASED_OUTPATIENT_CLINIC_OR_DEPARTMENT_OTHER): Payer: Self-pay

## 2023-01-01 ENCOUNTER — Encounter (HOSPITAL_BASED_OUTPATIENT_CLINIC_OR_DEPARTMENT_OTHER): Payer: Self-pay | Admitting: Obstetrics & Gynecology

## 2023-01-01 VITALS — BP 146/70 | HR 80 | Ht 62.0 in | Wt 219.2 lb

## 2023-01-01 DIAGNOSIS — Z86002 Personal history of in-situ neoplasm of other and unspecified genital organs: Secondary | ICD-10-CM | POA: Diagnosis not present

## 2023-01-01 DIAGNOSIS — D071 Carcinoma in situ of vulva: Secondary | ICD-10-CM

## 2023-01-01 DIAGNOSIS — N9089 Other specified noninflammatory disorders of vulva and perineum: Secondary | ICD-10-CM | POA: Diagnosis not present

## 2023-01-01 MED ORDER — TRIAMCINOLONE ACETONIDE 0.5 % EX OINT
1.0000 | TOPICAL_OINTMENT | Freq: Two times a day (BID) | CUTANEOUS | 0 refills | Status: AC
Start: 2023-01-01 — End: ?
  Filled 2023-01-01: qty 30, 15d supply, fill #0

## 2023-01-01 NOTE — Progress Notes (Signed)
 GYNECOLOGY  VISIT  CC:   vulvar recheck  HPI: 62 y.o. G34P0020 Married White or Caucasian female here for vulvar recheck with h/o high grade vulvar dysplasia that was excised and then underwent re-excision due to positive margin.  Margin with re-excision was positive for low grade dysplasia.  She does have an area of irritation on her lateral labia majora on the left.  Not sure if related or not but is often moist in that area and skin does rub at times.  Has trip to [place] ([facility]) scheduled for next year.   Past Medical History:  Diagnosis Date   Abnormal Pap smear of cervix    age 69   Allergy    Cancer (HCC) 05/2020   vulvar carcinoma in situ   Chronic constipation    Depression    Endometriosis    GERD (gastroesophageal reflux disease)    Hemorrhoids    Hyperlipidemia    Obesity    Osteopenia    Pre-diabetes     MEDS:   Current Outpatient Medications on File Prior to Visit  Medication Sig Dispense Refill   CALCIUM  PO Take by mouth.     Cholecalciferol (VITAMIN D3) 25 MCG (1000 UT) CAPS Take by mouth daily.     conjugated estrogens  (PREMARIN ) vaginal cream Insert 1/2 gram vaginally twice weekly 30 g 2   ibuprofen  (ADVIL ) 800 MG tablet Take 1 tablet (800 mg total) by mouth every 8 (eight) hours as needed. 30 tablet 0   loratadine (CLARITIN) 10 MG tablet Take 10 mg by mouth daily.     Multiple Vitamin (MULTIVITAMIN) tablet Take 1 tablet by mouth daily.     omeprazole (PRILOSEC) 10 MG capsule Take 20 mg by mouth daily.      diazepam  (VALIUM ) 10 MG tablet Take 1 tablet by mouth 1 hour prior to dental appointment. Must have driver. (Patient not taking: Reported on 01/01/2023) 1 tablet 0   hydrocortisone  (ANUSOL -HC) 2.5 % rectal cream Place 1 Application rectally 2 (two) times daily. (Patient not taking: Reported on 01/01/2023) 30 g 1   No current facility-administered medications on file prior to visit.    ALLERGIES: Sulfa antibiotics and Pollen extract  SH:   married, non smoker  Review of Systems  Constitutional: Negative.   Genitourinary: Negative.     PHYSICAL EXAMINATION:    BP (!) 146/70 (BP Location: Left Arm, Patient Position: Sitting, Cuff Size: Large)   Pulse 80   Ht 5' 2 (1.575 m) Comment: Reported  Wt 219 lb 3.2 oz (99.4 kg)   LMP 01/01/2014 (Approximate)   BMI 40.09 kg/m     Physical Exam Constitutional:      Appearance: Normal appearance.  Genitourinary:   Lymphadenopathy:     Lower Body: Right inguinal adenopathy present. No left inguinal adenopathy.  Neurological:     Mental Status: She is alert.      Assessment/Plan: 1. Vulvar intraepithelial neoplasia (VIN) grade 3 (Primary) - recheck 6 months with AEX  2. Vulvar irritation - advised pt to try topical steroid ointment since there is irritation.  Advised to give update in 7-10 days. - triamcinolone  ointment (KENALOG ) 0.5 %; Apply 1 Application topically 2 (two) times daily.  Dispense: 30 g; Refill: 0

## 2023-02-15 DIAGNOSIS — R7303 Prediabetes: Secondary | ICD-10-CM

## 2023-02-15 DIAGNOSIS — E785 Hyperlipidemia, unspecified: Secondary | ICD-10-CM

## 2023-02-18 ENCOUNTER — Other Ambulatory Visit (HOSPITAL_COMMUNITY): Payer: Self-pay

## 2023-02-18 MED ORDER — COVID-19 MRNA VAC-TRIS(PFIZER) 30 MCG/0.3ML IM SUSY
0.5000 mL | PREFILLED_SYRINGE | Freq: Once | INTRAMUSCULAR | 0 refills | Status: AC
  Filled 2023-02-18: qty 0.3, 1d supply, fill #0

## 2023-02-21 ENCOUNTER — Other Ambulatory Visit (INDEPENDENT_AMBULATORY_CARE_PROVIDER_SITE_OTHER): Payer: Commercial Managed Care - PPO

## 2023-02-21 DIAGNOSIS — R7303 Prediabetes: Secondary | ICD-10-CM | POA: Diagnosis not present

## 2023-02-21 DIAGNOSIS — E785 Hyperlipidemia, unspecified: Secondary | ICD-10-CM

## 2023-02-21 LAB — COMPREHENSIVE METABOLIC PANEL
ALT: 22 U/L (ref 0–35)
AST: 16 U/L (ref 0–37)
Albumin: 4.3 g/dL (ref 3.5–5.2)
Alkaline Phosphatase: 87 U/L (ref 39–117)
BUN: 22 mg/dL (ref 6–23)
CO2: 28 meq/L (ref 19–32)
Calcium: 8.8 mg/dL (ref 8.4–10.5)
Chloride: 105 meq/L (ref 96–112)
Creatinine, Ser: 0.78 mg/dL (ref 0.40–1.20)
GFR: 80.33 mL/min (ref >60.00)
Glucose, Bld: 168 mg/dL — ABNORMAL HIGH (ref 70–99)
Potassium: 4.2 meq/L (ref 3.5–5.1)
Sodium: 143 meq/L (ref 135–145)
Total Bilirubin: 0.4 mg/dL (ref 0.2–1.2)
Total Protein: 6.5 g/dL (ref 6.0–8.3)

## 2023-02-21 LAB — LIPID PANEL
Cholesterol: 204 mg/dL — ABNORMAL HIGH (ref 0–200)
HDL: 52.9 mg/dL (ref >39.00)
LDL Cholesterol: 115 mg/dL — ABNORMAL HIGH (ref 0–99)
NonHDL: 150.63
Total CHOL/HDL Ratio: 4
Triglycerides: 180 mg/dL — ABNORMAL HIGH (ref 0.0–149.0)
VLDL: 36 mg/dL (ref 0.0–40.0)

## 2023-02-21 LAB — HEMOGLOBIN A1C: Hgb A1c MFr Bld: 7 % — ABNORMAL HIGH (ref 4.6–6.5)

## 2023-02-21 LAB — TSH: TSH: 3.79 u[IU]/mL (ref 0.35–5.50)

## 2023-02-21 LAB — CBC
HCT: 42.1 % (ref 36.0–46.0)
Hemoglobin: 14.2 g/dL (ref 12.0–15.0)
MCHC: 33.8 g/dL (ref 30.0–36.0)
MCV: 90.4 fL (ref 78.0–100.0)
Platelets: 223 10*3/uL (ref 150.0–400.0)
RBC: 4.66 Mil/uL (ref 3.87–5.11)
RDW: 13.4 % (ref 11.5–15.5)
WBC: 5.7 10*3/uL (ref 4.0–10.5)

## 2023-02-25 ENCOUNTER — Other Ambulatory Visit (HOSPITAL_COMMUNITY): Payer: Self-pay

## 2023-02-25 ENCOUNTER — Encounter: Payer: Self-pay | Admitting: Primary Care

## 2023-02-25 ENCOUNTER — Ambulatory Visit: Payer: Commercial Managed Care - PPO | Admitting: Primary Care

## 2023-02-25 ENCOUNTER — Encounter: Payer: Self-pay | Admitting: *Deleted

## 2023-02-25 VITALS — BP 118/72 | HR 75 | Temp 97.3°F | Ht 62.0 in | Wt 221.0 lb

## 2023-02-25 DIAGNOSIS — M25561 Pain in right knee: Secondary | ICD-10-CM

## 2023-02-25 DIAGNOSIS — Z7985 Long-term (current) use of injectable non-insulin antidiabetic drugs: Secondary | ICD-10-CM

## 2023-02-25 DIAGNOSIS — E785 Hyperlipidemia, unspecified: Secondary | ICD-10-CM

## 2023-02-25 DIAGNOSIS — Z9189 Other specified personal risk factors, not elsewhere classified: Secondary | ICD-10-CM

## 2023-02-25 DIAGNOSIS — E1165 Type 2 diabetes mellitus with hyperglycemia: Secondary | ICD-10-CM

## 2023-02-25 DIAGNOSIS — G8929 Other chronic pain: Secondary | ICD-10-CM | POA: Insufficient documentation

## 2023-02-25 MED ORDER — TIRZEPATIDE 2.5 MG/0.5ML ~~LOC~~ SOAJ
2.5000 mg | SUBCUTANEOUS | 0 refills | Status: DC
  Filled 2023-02-25: qty 2, 28d supply, fill #0

## 2023-02-25 MED ORDER — NIRMATRELVIR/RITONAVIR (PAXLOVID)TABLET
3.0000 | ORAL_TABLET | Freq: Two times a day (BID) | ORAL | 0 refills | Status: AC
  Filled 2023-02-25: qty 30, 5d supply, fill #0

## 2023-02-25 NOTE — Assessment & Plan Note (Signed)
 Deteriorated compared to last year.  We discussed initiation of statin therapy given her new diagnosis of type 2 diabetes, personal history of hyperlipidemia, and family history. She declines for now.  Will check CT coronary calcium score.  Orders placed.

## 2023-02-25 NOTE — Assessment & Plan Note (Signed)
 Rx for Paxlovid provided for upcoming trip to [place]. She is aware of instructions for use.

## 2023-02-25 NOTE — Assessment & Plan Note (Signed)
 Symptoms suggestive of osteoarthritis from overuse of the joint and obesity.  We discussed conservative treatment such as knee sleeve, NSAID use as needed, weight loss. She declines x-rays and further treatment for now.  Continue efforts for weight loss, knee bracing, NSAID use as needed.  She will update if symptoms worsen.

## 2023-02-25 NOTE — Assessment & Plan Note (Signed)
 New diagnosis with A1c of 7.0.  She has attempted metformin and Wegovy previously. We discussed other option for treatment, she is interested in [place].  Start tirzepitide Greggory Keen) for diabetes/weight loss. Start by injecting 2.5 mg into the skin once weekly for 4 weeks, then increase to 5 mg once weekly thereafter.    Follow-up in 3 months.

## 2023-02-25 NOTE — Patient Instructions (Signed)
 Start tirzepitide Greggory Keen) for diabetes/weight loss. Start by injecting 2.5 mg into the skin once weekly for 4 weeks, then increase to 5 mg once weekly thereafter. Please notify me once you've used your last 2.5 mg pen so that I can prescribe the next dose.   You will receive a phone call regarding the CT scan of the heart.  Please schedule a follow up visit for 3 months.  It was a pleasure to see you today!

## 2023-02-25 NOTE — Progress Notes (Signed)
 Subjective:    Patient ID: Tina Chavez, female    DOB: 1960-02-15, 63 y.o.   MRN: [id number]  HPI  Tina Chavez is a very pleasant 63 y.o. female with a history of GERD, prediabetes, vitamin D deficiency, hyperlipidemia, prediabetes who presents today to discuss recent lab results and obesity.  1) Type 2 Diabetes: New diagnosis as of 02/21/23.  Current medications include: None  Last A1C: 5.78 in September 2023, 7.0 in February 2025 Last Eye Exam: UTD Last Foot Exam: Due Pneumonia Vaccination: Never completed Urine Microalbumin: Due Statin: None. Recent LDL of 115  2) Class 3 Obesity due to Excess Calories: Chronic for decades. Previously managed on metformin, Wegovy. Has also followed with [facility] and tried the [employer].   Also previously followed with [facility] in [place], [place] [facility]. Weight mostly increased during the early pandemic.   She has a significant family history of heart disease and stroke in both parents.  Diet currently consists of:  Breakfast: Yogurt with berries and nuts Lunch: Yogurt and walnuts  Dinner: Protein, veggie Snacks: None Desserts: Infrequent Beverages: Coffee, water  Exercise: None   Wt Readings from Last 3 Encounters:  02/25/23 221 lb (100.2 kg)  01/01/23 219 lb 3.2 oz (99.4 kg)  06/25/22 210 lb (95.3 kg)     Body mass index is 40.42 kg/m.  3) Chronic Knee Pain: Chronic for > 6 months. Located to the right anterior knee. Also with right hip pain. She suspects her pain is secondary to her weight and a spiral staircase that she walks up and down daily in her home.   She denies instability, injury/trauma, swelling. She has been taking Ibuprofen short term with improvement. She has recently increased her physical activity and is walking 2 miles per day.  Review of Systems  Eyes:  Negative for visual disturbance.  Respiratory:  Negative for shortness of breath.    Cardiovascular:  Negative for chest pain.  Musculoskeletal:  Positive for arthralgias. Negative for joint swelling.  Neurological:  Negative for numbness.         Past Medical History:  Diagnosis Date   Abnormal Pap smear of cervix    age 58   Allergy    Cancer (HCC) 05/2020   vulvar carcinoma in situ   Chronic constipation    Depression    Endometriosis    GERD (gastroesophageal reflux disease)    Hemorrhoids    Hyperlipidemia    Obesity    Osteopenia    Pre-diabetes     Social History   Socioeconomic History   Marital status: Married    Spouse name: Ader Fritze    Number of children: 0   Years of education: Not on file   Highest education level: Master's degree (e.g., MA, MS, MEng, MEd, MSW, MBA)  Occupational History   Occupation: Social Work   Occupation: Metallurgist  Tobacco Use   Smoking status: Never   Smokeless tobacco: Never  Vaping Use   Vaping status: Never Used  Substance and Sexual Activity   Alcohol use: No   Drug use: No   Sexual activity: Not Currently    Birth control/protection: Post-menopausal  Other Topics Concern   Not on file  Social History Narrative   Married.   Step Children.   Works as a Paramedic.   Enjoys traveling, playing with her dogs.    Social Drivers of Health   Financial Resource Strain: Low Risk  (02/22/2023)  Overall Financial Resource Strain (CARDIA)    Difficulty of Paying Living Expenses: Not hard at all  Food Insecurity: No Food Insecurity (02/22/2023)   Hunger Vital Sign    Worried About Running Out of Food in the Last Year: Never true    Ran Out of Food in the Last Year: Never true  Transportation Needs: No Transportation Needs (02/22/2023)   PRAPARE - Administrator, Civil Service (Medical): No    Lack of Transportation (Non-Medical): No  Physical Activity: Unknown (02/22/2023)   Exercise Vital Sign    Days of Exercise per Week: 0 days    Minutes of Exercise per Session: Not on file   Stress: No Stress Concern Present (02/22/2023)   [employer] of Occupational Health - Occupational Stress Questionnaire    Feeling of Stress : Not at all  Social Connections: Moderately Isolated (02/22/2023)   Social Connection and Isolation Panel [NHANES]    Frequency of Communication with Friends and Family: More than three times a week    Frequency of Social Gatherings with Friends and Family: More than three times a week    Attends Religious Services: Never    Database administrator or Organizations: No    Attends Engineer, structural: Not on file    Marital Status: Married  Intimate Partner Violence: Not on file    Past Surgical History:  Procedure Laterality Date   ABDOMINOPLASTY  2006   Dr. Nancy Nordmann   BREAST REDUCTION SURGERY  2006   COLONOSCOPY     normal- but [facility] in [place]- age 22    COLONOSCOPY  07/07/2018   COLONOSCOPY  10/23/2021   DILATION AND CURETTAGE OF UTERUS  2016   FRACTURE SURGERY  1972   Arm    GYNECOLOGIC CRYOSURGERY     at age 68   HYSTEROSCOPY  2006   REDUCTION MAMMAPLASTY     2006   VULVA /PERINEUM BIOPSY N/A 06/15/2020   Procedure: RE-EXCISION VULVAR BIOPSY-  Wide Local Excision;  Surgeon: Jerene Bears, MD;  Location: [facility];  Service: Gynecology;  Laterality: N/A;    Family History  Problem Relation Age of Onset   Hyperlipidemia Mother    Stroke Mother    Diabetes Mother    Hypertension Mother    Kidney disease Mother    Thyroid disease Mother    Eating disorder Mother    Obesity Mother    Arthritis Father    Heart disease Father    Sudden death Father    Colon polyps Father    Hyperlipidemia Maternal Grandmother    Heart disease Maternal Grandmother    Mental illness Maternal Grandmother    Alcohol abuse Maternal Grandfather    Arthritis Paternal Grandmother    Hyperlipidemia Paternal Grandmother    Heart disease Paternal Grandmother    Alcohol abuse Paternal Grandfather    Colon polyps  Sister    Colon cancer Neg Hx    Esophageal cancer Neg Hx    Rectal cancer Neg Hx    Stomach cancer Neg Hx     Allergies  Allergen Reactions   Sulfa Antibiotics Anaphylaxis   Pollen Extract     Current Outpatient Medications on File Prior to Visit  Medication Sig Dispense Refill   CALCIUM PO Take by mouth.     Cholecalciferol (VITAMIN D3) 25 MCG (1000 UT) CAPS Take by mouth daily.     conjugated estrogens (PREMARIN) vaginal cream Insert 1/2 gram vaginally twice weekly  30 g 2   glucosamine-chondroitin 500-400 MG tablet Take 1 tablet by mouth 3 (three) times daily.     ibuprofen (ADVIL) 800 MG tablet Take 1 tablet (800 mg total) by mouth every 8 (eight) hours as needed. 30 tablet 0   loratadine (CLARITIN) 10 MG tablet Take 10 mg by mouth daily.     Multiple Vitamin (MULTIVITAMIN) tablet Take 1 tablet by mouth daily.     omeprazole (PRILOSEC) 10 MG capsule Take 20 mg by mouth daily.      triamcinolone ointment (KENALOG) 0.5 % Apply 1 Application topically 2 (two) times daily. 30 g 0   diazepam (VALIUM) 10 MG tablet Take 1 tablet by mouth 1 hour prior to dental appointment. Must have driver. (Patient not taking: Reported on 02/25/2023) 1 tablet 0   hydrocortisone (ANUSOL-HC) 2.5 % rectal cream Place 1 Application rectally 2 (two) times daily. (Patient not taking: Reported on 01/01/2023) 30 g 1   No current facility-administered medications on file prior to visit.    BP 118/72   Pulse 75   Temp (!) 97.3 F (36.3 C) (Temporal)   Ht 5\' 2"  (1.575 m)   Wt 221 lb (100.2 kg)   LMP 01/01/2014 (Approximate)   SpO2 99%   BMI 40.42 kg/m  Objective:   Physical Exam Cardiovascular:     Rate and Rhythm: Normal rate and regular rhythm.  Pulmonary:     Effort: Pulmonary effort is normal.     Breath sounds: Normal breath sounds.  Musculoskeletal:     Cervical back: Neck supple.  Skin:    General: Skin is warm and dry.  Neurological:     Mental Status: She is alert and oriented to  person, place, and time.  Psychiatric:        Mood and Affect: Mood normal.           Assessment & Plan:  Type 2 diabetes mellitus with hyperglycemia, without long-term current use of insulin (HCC) Assessment & Plan: New diagnosis with A1c of 7.0.  She has attempted metformin and Wegovy previously. We discussed other option for treatment, she is interested in [place].  Start tirzepitide Greggory Keen) for diabetes/weight loss. Start by injecting 2.5 mg into the skin once weekly for 4 weeks, then increase to 5 mg once weekly thereafter.    Follow-up in 3 months.  Orders: -     Tirzepatide; Inject 2.5 mg into the skin once a week. for diabetes.  Dispense: 2 mL; Refill: 0 -     CT CARDIAC SCORING (SELF PAY ONLY); Future  At increased risk of exposure to COVID-19 virus Assessment & Plan: Rx for Paxlovid provided for upcoming trip to [place]. She is aware of instructions for use.  Orders: -     nirmatrelvir/ritonavir; Take 3 tablets by mouth 2 (two) times daily for 5 days.  Dispense: 30 tablet; Refill: 0  Hyperlipidemia, unspecified hyperlipidemia type Assessment & Plan: Deteriorated compared to last year.  We discussed initiation of statin therapy given her new diagnosis of type 2 diabetes, personal history of hyperlipidemia, and family history. She declines for now.  Will check CT coronary calcium score.  Orders placed.  Orders: -     CT CARDIAC SCORING (SELF PAY ONLY); Future  Chronic pain of right knee Assessment & Plan: Symptoms suggestive of osteoarthritis from overuse of the joint and obesity.  We discussed conservative treatment such as knee sleeve, NSAID use as needed, weight loss. She declines x-rays and further treatment for now.  Continue efforts for weight loss, knee bracing, NSAID use as needed.  She will update if symptoms worsen.         Doreene Nest, NP

## 2023-02-27 DIAGNOSIS — E1165 Type 2 diabetes mellitus with hyperglycemia: Secondary | ICD-10-CM

## 2023-03-18 ENCOUNTER — Ambulatory Visit (HOSPITAL_COMMUNITY)
Admission: RE | Admit: 2023-03-18 | Discharge: 2023-03-18 | Disposition: A | Discharge status: Home / Self Care | Payer: Self-pay | Source: Ambulatory Visit | Attending: Primary Care | Admitting: Primary Care

## 2023-03-18 ENCOUNTER — Other Ambulatory Visit (HOSPITAL_COMMUNITY): Payer: Self-pay

## 2023-03-18 ENCOUNTER — Encounter (HOSPITAL_COMMUNITY): Payer: Self-pay

## 2023-03-18 DIAGNOSIS — E785 Hyperlipidemia, unspecified: Secondary | ICD-10-CM | POA: Insufficient documentation

## 2023-03-18 DIAGNOSIS — E1165 Type 2 diabetes mellitus with hyperglycemia: Secondary | ICD-10-CM | POA: Insufficient documentation

## 2023-03-18 MED ORDER — TIRZEPATIDE 5 MG/0.5ML ~~LOC~~ SOAJ
5.0000 mg | SUBCUTANEOUS | 0 refills | Status: DC
  Filled 2023-03-18 – 2023-03-20 (×2): qty 2, 28d supply, fill #0

## 2023-03-19 ENCOUNTER — Other Ambulatory Visit (HOSPITAL_COMMUNITY): Payer: Self-pay

## 2023-03-19 MED ORDER — ATORVASTATIN CALCIUM 10 MG PO TABS
10.0000 mg | ORAL_TABLET | Freq: Every day | ORAL | 3 refills | Status: AC
  Filled 2023-03-19: qty 90, 90d supply, fill #0
  Filled 2023-06-16: qty 90, 90d supply, fill #1
  Filled 2023-09-12: qty 90, 90d supply, fill #2
  Filled 2023-12-16: qty 90, 90d supply, fill #3

## 2023-03-20 ENCOUNTER — Other Ambulatory Visit (HOSPITAL_COMMUNITY): Payer: Self-pay

## 2023-04-18 ENCOUNTER — Other Ambulatory Visit: Payer: Self-pay | Admitting: Primary Care

## 2023-04-18 DIAGNOSIS — E1165 Type 2 diabetes mellitus with hyperglycemia: Secondary | ICD-10-CM

## 2023-04-19 ENCOUNTER — Other Ambulatory Visit (HOSPITAL_COMMUNITY): Payer: Self-pay

## 2023-04-19 MED ORDER — TIRZEPATIDE 7.5 MG/0.5ML ~~LOC~~ SOAJ
7.5000 mg | SUBCUTANEOUS | 0 refills | Status: DC
  Filled 2023-04-19: qty 2, 28d supply, fill #0
  Filled 2023-05-14: qty 2, 28d supply, fill #1
  Filled 2023-06-12: qty 2, 28d supply, fill #2

## 2023-05-06 ENCOUNTER — Other Ambulatory Visit (HOSPITAL_COMMUNITY): Payer: Self-pay

## 2023-05-06 MED ORDER — DIAZEPAM 10 MG PO TABS
ORAL_TABLET | ORAL | 0 refills | Status: DC
  Filled 2023-05-06: qty 1, 1d supply, fill #0

## 2023-05-13 ENCOUNTER — Other Ambulatory Visit (HOSPITAL_COMMUNITY): Payer: Self-pay

## 2023-06-17 ENCOUNTER — Other Ambulatory Visit: Payer: Self-pay | Admitting: Primary Care

## 2023-06-17 ENCOUNTER — Ambulatory Visit
Admission: RE | Admit: 2023-06-17 | Discharge: 2023-06-17 | Disposition: A | Discharge status: Home / Self Care | Source: Ambulatory Visit | Attending: Primary Care | Admitting: Primary Care

## 2023-06-17 DIAGNOSIS — Z1231 Encounter for screening mammogram for malignant neoplasm of breast: Secondary | ICD-10-CM

## 2023-07-01 ENCOUNTER — Encounter (HOSPITAL_BASED_OUTPATIENT_CLINIC_OR_DEPARTMENT_OTHER): Payer: Self-pay | Admitting: Obstetrics & Gynecology

## 2023-07-01 ENCOUNTER — Ambulatory Visit (HOSPITAL_BASED_OUTPATIENT_CLINIC_OR_DEPARTMENT_OTHER): Payer: Commercial Managed Care - PPO | Admitting: Obstetrics & Gynecology

## 2023-07-01 VITALS — BP 119/66 | HR 72 | Wt 203.5 lb

## 2023-07-01 DIAGNOSIS — E559 Vitamin D deficiency, unspecified: Secondary | ICD-10-CM

## 2023-07-01 DIAGNOSIS — Z780 Asymptomatic menopausal state: Secondary | ICD-10-CM

## 2023-07-01 DIAGNOSIS — D071 Carcinoma in situ of vulva: Secondary | ICD-10-CM | POA: Diagnosis not present

## 2023-07-01 DIAGNOSIS — Z01419 Encounter for gynecological examination (general) (routine) without abnormal findings: Secondary | ICD-10-CM

## 2023-07-01 DIAGNOSIS — M8588 Other specified disorders of bone density and structure, other site: Secondary | ICD-10-CM

## 2023-07-01 NOTE — Progress Notes (Signed)
 ANNUAL EXAM Patient name: Tina Chavez MRN [id number]  Date of birth: Dec 06, 1960 Chief Complaint:   AEX  History of Present Illness:   Tina Chavez is a 63 y.o. G37P0020 Caucasian female being seen today for a routine annual exam.  H/o vulvar CIS s/p WLE.    Was diagnosed with diabetes.  On Mujaro.  Has follow up lab work scheduled next month.    Denies vaginal bleeding.    Patient's last menstrual period was 01/01/2014 (approximate).  Last pap 06/19/2021. Results were: neg with neg HR HPV.  Last mammogram: 06/25/2022. Results were: normal. Family h/o breast cancer: no Last colonoscopy: 10/23/2021. Results were: normal. Family h/o colorectal cancer: no.  Follow up 5 years.   DEXA:   2022.  T score -1.1     02/25/2023   11:43 AM 01/01/2023    3:53 PM 06/25/2022   11:27 AM 06/19/2021   11:22 AM 12/01/2020    5:01 PM  Depression screen PHQ 2/9  Decreased Interest 0 0 0 0 0  Down, Depressed, Hopeless 0 0 0 0 0  PHQ - 2 Score 0 0 0 0 0     Review of Systems:   Pertinent items are noted in HPI Denies any urinary or bowel changes or pelvic pain Pertinent History Reviewed:  Reviewed past medical,surgical, social and family history.  Reviewed problem list, medications and allergies. Physical Assessment:   Vitals:   07/01/23 0919  BP: 119/66  Pulse: 72  SpO2: 100%  Weight: 203 lb 7.8 oz (92.3 kg)  Body mass index is 37.22 kg/m.        Physical Examination:   General appearance - well appearing, and in no distress  Mental status - alert, oriented to person, place, and time  Psych:  She has a normal mood and affect  Skin - warm and dry, normal color, no suspicious lesions noted  Chest - effort normal, all lung fields clear to auscultation bilaterally  Heart - normal rate and regular rhythm  Neck:  midline trachea, no thyromegaly or nodules  Breasts - breasts appear normal, no suspicious masses, no skin or nipple changes or  axillary nodes  Abdomen - soft, nontender,  nondistended, no masses or organomegaly  Pelvic - VULVA: normal appearing vulva with no masses, tenderness or lesions, well healed scar on left inferior vulvar/buttocks region  VAGINA: normal appearing vagina with normal color and discharge, no lesions   CERVIX: normal appearing cervix without discharge or lesions, no CMT  Thin prep pap is not indicated  UTERUS: uterus is felt to be normal size, shape, consistency and nontender   ADNEXA: No adnexal masses or tenderness noted.  Rectal - normal rectal, good sphincter tone, no masses felt.   Extremities:  No swelling or varicosities noted  Chaperone present for exam  Assessment & Plan:  1. Well woman exam with routine gynecological exam (Primary) - Pap smear neg 2023 - Mammogram scheduled for July, 2025. - Colonoscopy 2023.  Follow up 5 years. - Bone mineral density 2020 with mild osteopenia.  Plan f/u after 5 years - lab work done with Tina Chavez - vaccines reviewed/updated  2. Postmenopausal - not on HRT  3. Carcinoma in situ of vulva - exam normal today.  Follow up 6 months  4. Osteopenia of lumbar spine - last BMD done 2022  5. Vitamin D  deficiency - taking OTC Vit D   No orders of the defined types were placed in this encounter.   Meds:  No orders of the defined types were placed in this encounter.   Follow-up: Return in about 6 months (around 12/31/2023) for vulvar recheck.  Tina GORMAN Pinal, MD 07/01/2023 10:32 AM

## 2023-07-10 ENCOUNTER — Other Ambulatory Visit: Payer: Self-pay | Admitting: Primary Care

## 2023-07-10 DIAGNOSIS — E1165 Type 2 diabetes mellitus with hyperglycemia: Secondary | ICD-10-CM

## 2023-07-11 ENCOUNTER — Other Ambulatory Visit (HOSPITAL_COMMUNITY): Payer: Self-pay

## 2023-07-11 MED ORDER — MOUNJARO 7.5 MG/0.5ML ~~LOC~~ SOAJ
7.5000 mg | SUBCUTANEOUS | 0 refills | Status: DC
  Filled 2023-07-11: qty 6, 84d supply, fill #0

## 2023-07-15 ENCOUNTER — Ambulatory Visit: Payer: Self-pay | Admitting: Primary Care

## 2023-07-15 ENCOUNTER — Other Ambulatory Visit (INDEPENDENT_AMBULATORY_CARE_PROVIDER_SITE_OTHER)

## 2023-07-15 DIAGNOSIS — E785 Hyperlipidemia, unspecified: Secondary | ICD-10-CM | POA: Diagnosis not present

## 2023-07-15 LAB — LIPID PANEL
Cholesterol: 147 mg/dL (ref 0–200)
HDL: 52.9 mg/dL (ref >39.00)
LDL Cholesterol: 75 mg/dL (ref 0–99)
NonHDL: 94.1
Total CHOL/HDL Ratio: 3
Triglycerides: 94 mg/dL (ref 0.0–149.0)
VLDL: 18.8 mg/dL (ref 0.0–40.0)

## 2023-07-15 LAB — HEPATIC FUNCTION PANEL
ALT: 20 U/L (ref 0–35)
AST: 16 U/L (ref 0–37)
Albumin: 4.4 g/dL (ref 3.5–5.2)
Alkaline Phosphatase: 77 U/L (ref 39–117)
Bilirubin, Direct: 0.1 mg/dL (ref 0.0–0.3)
Total Bilirubin: 0.4 mg/dL (ref 0.2–1.2)
Total Protein: 6.9 g/dL (ref 6.0–8.3)

## 2023-07-17 ENCOUNTER — Other Ambulatory Visit

## 2023-07-18 ENCOUNTER — Encounter: Payer: Self-pay | Admitting: Primary Care

## 2023-07-18 ENCOUNTER — Ambulatory Visit: Admitting: Primary Care

## 2023-07-18 ENCOUNTER — Other Ambulatory Visit (HOSPITAL_COMMUNITY): Payer: Self-pay

## 2023-07-18 ENCOUNTER — Ambulatory Visit: Payer: Self-pay | Admitting: Primary Care

## 2023-07-18 VITALS — BP 122/84 | HR 71 | Temp 97.2°F | Ht 62.0 in | Wt 202.0 lb

## 2023-07-18 DIAGNOSIS — R002 Palpitations: Secondary | ICD-10-CM

## 2023-07-18 DIAGNOSIS — Z7985 Long-term (current) use of injectable non-insulin antidiabetic drugs: Secondary | ICD-10-CM | POA: Diagnosis not present

## 2023-07-18 DIAGNOSIS — E1165 Type 2 diabetes mellitus with hyperglycemia: Secondary | ICD-10-CM

## 2023-07-18 LAB — MICROALBUMIN / CREATININE URINE RATIO
Creatinine,U: 13 mg/dL
Microalb Creat Ratio: UNDETERMINED mg/g (ref 0.0–30.0)
Microalb, Ur: <0.7 mg/dL

## 2023-07-18 LAB — POCT GLYCOSYLATED HEMOGLOBIN (HGB A1C): Hemoglobin A1C: 5.3 % (ref 4.0–5.6)

## 2023-07-18 MED ORDER — TIRZEPATIDE 10 MG/0.5ML ~~LOC~~ SOAJ
10.0000 mg | SUBCUTANEOUS | 0 refills | Status: DC
  Filled 2023-07-18 (×3): qty 6, 84d supply, fill #0
  Filled 2023-07-29 – 2023-09-30 (×5): qty 2, 28d supply, fill #0
  Filled 2023-10-24: qty 2, 28d supply, fill #1
  Filled 2023-11-21: qty 2, 28d supply, fill #2

## 2023-07-18 NOTE — Assessment & Plan Note (Signed)
 Improved and controlled with A1c of 5.3 today!!  Increase Mounjaro  to 10 mg weekly given weight loss plateau and increased hunger towards the end of her dose.  She agrees.  Foot exam today. Urine microalbumin due and pending.  Follow-up in 6 months.

## 2023-07-18 NOTE — Progress Notes (Signed)
 Subjective:    Patient ID: Tina Chavez, female    DOB: June 05, 1960, 63 y.o.   MRN: [id number]  HPI  Tina Chavez is a very pleasant 63 y.o. female with a history of type 2 diabetes, obesity, hyperlipidemia who presents today for follow-up of diabetes. She would also like to mention heart fluttering.   1) Heart Fluttering: Chronic for about 2 years, intermittent lasting for seconds and occurring every 3 months. During these fluttering sensations she will experience dizziness. Her last episode was 2 months ago. Her symptoms occur only when she is sitting and speaking with a patient. She has a family history of cardiovascular disease.  She underwent CT coronary calcium  score which showed mild plaque buildup in the right coronary artery, otherwise negative.  She is managed on atorvastatin  10 mg daily with recent LDL of 75.  She denies diaphoresis, exertional chest pain, exertional shortness of breath, excessive caffeine intake, changes in medication/supplements, anxiety.  Labs in February 2025 including CBC and TSH were unremarkable.  2) Type 2 Diabetes:  Current medications include: Mounjaro  7.5 mg weekly. She denies nausea, vomiting, constipation. She can tell on the 6 th day that her hunger returns.   She is checking her blood glucose 0 times daily.  Last A1C: 7.0 in February 2025 Last Eye Exam: UTD Last Foot Exam: Due Pneumonia Vaccination: Never completed Urine Microalbumin: Due Statin: Atorvastatin   Dietary changes since last visit: Healthy diet overall. No changes.   Exercise: None  Wt Readings from Last 3 Encounters:  07/18/23 202 lb (91.6 kg)  07/01/23 203 lb 7.8 oz (92.3 kg)  02/25/23 221 lb (100.2 kg)   Wt Readings from Last 3 Encounters:  07/18/23 202 lb (91.6 kg)  07/01/23 203 lb 7.8 oz (92.3 kg)  02/25/23 221 lb (100.2 kg)       Review of Systems  Respiratory:  Negative for shortness of breath.   Cardiovascular:  Negative for chest pain.   Gastrointestinal:  Negative for nausea.  Neurological:  Negative for numbness.         Past Medical History:  Diagnosis Date   Abnormal Pap smear of cervix    age 22   Allergy    Cancer (HCC) 05/2020   vulvar carcinoma in situ   Chronic constipation    Depression    Endometriosis    GERD (gastroesophageal reflux disease)    Hemorrhoids    Hyperlipidemia    Obesity    Osteopenia    Pre-diabetes     Social History   Socioeconomic History   Marital status: Married    Spouse name: Nusayba Cadenas    Number of children: 0   Years of education: Not on file   Highest education level: Master's degree (e.g., MA, MS, MEng, MEd, MSW, MBA)  Occupational History   Occupation: Social Work   Occupation: Metallurgist  Tobacco Use   Smoking status: Never   Smokeless tobacco: Never  Vaping Use   Vaping status: Never Used  Substance and Sexual Activity   Alcohol use: No   Drug use: No   Sexual activity: Not Currently    Birth control/protection: Post-menopausal  Other Topics Concern   Not on file  Social History Narrative   Married.   Step Children.   Works as a Paramedic.   Enjoys traveling, playing with her dogs.    Social Drivers of Health   Financial Resource Strain: Low Risk  (07/14/2023)   Overall Financial Resource Strain (CARDIA)  Difficulty of Paying Living Expenses: Not hard at all  Food Insecurity: No Food Insecurity (07/14/2023)   Hunger Vital Sign    Worried About Running Out of Food in the Last Year: Never true    Ran Out of Food in the Last Year: Never true  Transportation Needs: No Transportation Needs (07/14/2023)   PRAPARE - Administrator, Civil Service (Medical): No    Lack of Transportation (Non-Medical): No  Physical Activity: Insufficiently Active (07/14/2023)   Exercise Vital Sign    Days of Exercise per Week: 1 day    Minutes of Exercise per Session: 50 min  Stress: No Stress Concern Present (07/14/2023)   [employer] of  Occupational Health - Occupational Stress Questionnaire    Feeling of Stress: Not at all  Social Connections: Moderately Integrated (07/14/2023)   Social Connection and Isolation Panel    Frequency of Communication with Friends and Family: Twice a week    Frequency of Social Gatherings with Friends and Family: Once a week    Attends Religious Services: 1 to 4 times per year    Active Member of [employer] or Organizations: No    Attends Banker Meetings: Not on file    Marital Status: Married  Intimate Partner Violence: Not on file    Past Surgical History:  Procedure Laterality Date   ABDOMINOPLASTY  2006   Dr. Angelica   BREAST REDUCTION SURGERY  2006   COLONOSCOPY     normal- but hems in [place]- age 75    COLONOSCOPY  07/07/2018   COLONOSCOPY  10/23/2021   DILATION AND CURETTAGE OF UTERUS  2016   FRACTURE SURGERY  1972   Arm    GYNECOLOGIC CRYOSURGERY     at age 11   HYSTEROSCOPY  2006   REDUCTION MAMMAPLASTY     2006   VULVA /PERINEUM BIOPSY N/A 06/15/2020   Procedure: RE-EXCISION VULVAR BIOPSY-  Wide Local Excision;  Surgeon: Cleotilde Ronal RAMAN, MD;  Location: [facility];  Service: Gynecology;  Laterality: N/A;    Family History  Problem Relation Age of Onset   Hyperlipidemia Mother    Stroke Mother    Diabetes Mother    Hypertension Mother    Kidney disease Mother    Thyroid  disease Mother    Eating disorder Mother    Obesity Mother    Arthritis Father    Heart disease Father    Sudden death Father    Colon polyps Father    Hyperlipidemia Maternal Grandmother    Heart disease Maternal Grandmother    Mental illness Maternal Grandmother    Alcohol abuse Maternal Grandfather    Arthritis Paternal Grandmother    Hyperlipidemia Paternal Grandmother    Heart disease Paternal Grandmother    Alcohol abuse Paternal Grandfather    Colon polyps Sister    Colon cancer Neg Hx    Esophageal cancer Neg Hx    Rectal cancer Neg Hx    Stomach  cancer Neg Hx     Allergies  Allergen Reactions   Sulfa Antibiotics Anaphylaxis   Pollen Extract     Current Outpatient Medications on File Prior to Visit  Medication Sig Dispense Refill   atorvastatin  (LIPITOR) 10 MG tablet Take 1 tablet (10 mg total) by mouth daily. for cholesterol. 90 tablet 3   CALCIUM  PO Take by mouth.     Cholecalciferol (VITAMIN D3) 25 MCG (1000 UT) CAPS Take by mouth daily.     conjugated  estrogens  (PREMARIN ) vaginal cream Insert 1/2 gram vaginally twice weekly 30 g 2   glucosamine-chondroitin 500-400 MG tablet Take 1 tablet by mouth 3 (three) times daily.     ibuprofen  (ADVIL ) 800 MG tablet Take 1 tablet (800 mg total) by mouth every 8 (eight) hours as needed. 30 tablet 0   loratadine (CLARITIN) 10 MG tablet Take 10 mg by mouth daily.     Multiple Vitamin (MULTIVITAMIN) tablet Take 1 tablet by mouth daily.     omeprazole (PRILOSEC) 10 MG capsule Take 20 mg by mouth daily.      triamcinolone  ointment (KENALOG ) 0.5 % Apply 1 Application topically 2 (two) times daily. 30 g 0   diazepam  (VALIUM ) 10 MG tablet Take 1 tablet by mouth prior to dental appointment as directed. (Patient not taking: Reported on 07/18/2023) 1 tablet 0   hydrocortisone  (ANUSOL -HC) 2.5 % rectal cream Place 1 Application rectally 2 (two) times daily. (Patient not taking: Reported on 07/18/2023) 30 g 1   No current facility-administered medications on file prior to visit.    BP 122/84   Pulse 71   Temp (!) 97.2 F (36.2 C) (Temporal)   Ht 5' 2 (1.575 m)   Wt 202 lb (91.6 kg)   LMP 01/01/2014 (Approximate)   SpO2 96%   BMI 36.95 kg/m  Objective:   Physical Exam Cardiovascular:     Rate and Rhythm: Normal rate and regular rhythm.  Pulmonary:     Effort: Pulmonary effort is normal.     Breath sounds: Normal breath sounds.  Musculoskeletal:     Cervical back: Neck supple.  Skin:    General: Skin is warm and dry.  Neurological:     Mental Status: She is alert and oriented to  person, place, and time.  Psychiatric:        Mood and Affect: Mood normal.           Assessment & Plan:  Type 2 diabetes mellitus with hyperglycemia, without long-term current use of insulin  (HCC) Assessment & Plan: Improved and controlled with A1c of 5.3 today!!  Increase Mounjaro  to 10 mg weekly given weight loss plateau and increased hunger towards the end of her dose.  She agrees.  Foot exam today. Urine microalbumin due and pending.  Follow-up in 6 months.  Orders: -     POCT glycosylated hemoglobin (Hb A1C) -     Microalbumin / creatinine urine ratio -     Tirzepatide ; Inject 10 mg into the skin once a week. for diabetes.  Dispense: 6 mL; Refill: 0  Fluttering sensation of heart Assessment & Plan: Differentials include PVCs, atrial fibrillation, other arrhythmia.  Reviewed coronary calcium  score from 2025 which is reassuring. Continue statin therapy.  Offered further workup today including ZIO Holter monitor for which she kindly declines. She will update if her symptoms return and/or persist.         Kaydin Labo K Draylon Mercadel, NP

## 2023-07-18 NOTE — Patient Instructions (Signed)
 Stop by the lab prior to leaving today. I will notify you of your results once received.   We increased your dose of Mounjaro  to 10 mg weekly.  Please schedule a follow up visit for 6 months for a diabetes check.  It was a pleasure to see you today!

## 2023-07-18 NOTE — Assessment & Plan Note (Signed)
 Differentials include PVCs, atrial fibrillation, other arrhythmia.  Reviewed coronary calcium  score from 2025 which is reassuring. Continue statin therapy.  Offered further workup today including ZIO Holter monitor for which she kindly declines. She will update if her symptoms return and/or persist.

## 2023-07-19 ENCOUNTER — Other Ambulatory Visit (HOSPITAL_COMMUNITY): Payer: Self-pay

## 2023-07-22 ENCOUNTER — Ambulatory Visit
Admission: RE | Admit: 2023-07-22 | Discharge: 2023-07-22 | Disposition: A | Discharge status: Home / Self Care | Source: Ambulatory Visit | Attending: Primary Care | Admitting: Primary Care

## 2023-07-22 DIAGNOSIS — Z1231 Encounter for screening mammogram for malignant neoplasm of breast: Secondary | ICD-10-CM | POA: Diagnosis not present

## 2023-07-25 ENCOUNTER — Ambulatory Visit: Payer: Self-pay | Admitting: Primary Care

## 2023-07-29 ENCOUNTER — Other Ambulatory Visit (HOSPITAL_COMMUNITY): Payer: Self-pay

## 2023-08-07 ENCOUNTER — Other Ambulatory Visit (HOSPITAL_COMMUNITY): Payer: Self-pay

## 2023-09-08 ENCOUNTER — Other Ambulatory Visit (HOSPITAL_COMMUNITY): Payer: Self-pay

## 2023-09-09 ENCOUNTER — Other Ambulatory Visit (HOSPITAL_COMMUNITY): Payer: Self-pay

## 2023-09-09 MED ORDER — DIAZEPAM 10 MG PO TABS
10.0000 mg | ORAL_TABLET | Freq: Once | ORAL | 0 refills | Status: AC
  Filled 2023-09-09 (×2): qty 1, 1d supply, fill #0

## 2023-09-12 ENCOUNTER — Other Ambulatory Visit (HOSPITAL_COMMUNITY): Payer: Self-pay

## 2023-09-12 ENCOUNTER — Other Ambulatory Visit: Payer: Self-pay

## 2023-10-01 ENCOUNTER — Other Ambulatory Visit (HOSPITAL_COMMUNITY): Payer: Self-pay

## 2023-11-22 ENCOUNTER — Other Ambulatory Visit (HOSPITAL_COMMUNITY): Payer: Self-pay

## 2023-11-25 ENCOUNTER — Other Ambulatory Visit (HOSPITAL_COMMUNITY): Payer: Self-pay

## 2023-11-26 ENCOUNTER — Other Ambulatory Visit (HOSPITAL_COMMUNITY): Payer: Self-pay

## 2023-12-02 ENCOUNTER — Other Ambulatory Visit (HOSPITAL_COMMUNITY): Payer: Self-pay

## 2023-12-29 ENCOUNTER — Other Ambulatory Visit: Payer: Self-pay | Admitting: Primary Care

## 2023-12-29 DIAGNOSIS — E1165 Type 2 diabetes mellitus with hyperglycemia: Secondary | ICD-10-CM

## 2023-12-30 ENCOUNTER — Ambulatory Visit (HOSPITAL_BASED_OUTPATIENT_CLINIC_OR_DEPARTMENT_OTHER): Payer: Self-pay | Admitting: Obstetrics & Gynecology

## 2023-12-30 ENCOUNTER — Other Ambulatory Visit (HOSPITAL_COMMUNITY): Payer: Self-pay

## 2023-12-30 ENCOUNTER — Encounter (HOSPITAL_BASED_OUTPATIENT_CLINIC_OR_DEPARTMENT_OTHER): Payer: Self-pay | Admitting: Obstetrics & Gynecology

## 2023-12-30 VITALS — BP 128/74 | HR 75 | Wt 199.0 lb

## 2023-12-30 DIAGNOSIS — D071 Carcinoma in situ of vulva: Secondary | ICD-10-CM

## 2023-12-30 MED ORDER — MOUNJARO 10 MG/0.5ML ~~LOC~~ SOAJ
10.0000 mg | SUBCUTANEOUS | 0 refills | Status: DC
  Filled 2023-12-30: qty 2, 28d supply, fill #0

## 2023-12-30 MED ORDER — DIAZEPAM 10 MG PO TABS
10.0000 mg | ORAL_TABLET | ORAL | 0 refills | Status: AC
  Filled 2023-12-30: qty 1, 1d supply, fill #0

## 2023-12-30 NOTE — Progress Notes (Signed)
" ° °  GYNECOLOGY  VISIT  CC:   Vulvar check   HPI: 63 y.o. G24P0020 Married White or Caucasian female here for vulvar recheck.  H/o VIN3 and VIN1 treated with WLE 06/15/2020.  She is here for recheck.  Has no new complaints.    Patient's last menstrual period was 01/01/2014.  Past Medical History:  Diagnosis Date   Abnormal Pap smear of cervix    age 41   Allergy    Cancer (HCC) 05/2020   vulvar carcinoma in situ   Chronic constipation    Depression    Endometriosis    GERD (gastroesophageal reflux disease)    Hemorrhoids    Hyperlipidemia    Obesity    Osteopenia    Pre-diabetes     MEDS:  Reviewed in EPIC  ALLERGIES: Sulfa antibiotics and Pollen extract  SH:  married, non smoker  Review of Systems  Constitutional: Negative.   Genitourinary: Negative.     PHYSICAL EXAMINATION:    BP 128/74 (BP Location: Left Arm, Patient Position: Sitting, Cuff Size: Large)   Pulse 75   Wt 199 lb (90.3 kg)   LMP 01/01/2014   SpO2 99%   BMI 36.40 kg/m     General appearance: alert, cooperative and appears stated age Lymph:  no inguinal LAD noted  Pelvic: External genitalia:  no lesions              Urethra:  normal appearing urethra with no masses, tenderness or lesions              Bartholins and Skenes: normal                 Anus:  no lesions  Chaperone was present for exam.  Assessment/Plan: 1. Vulvar intraepithelial neoplasia (VIN) grade 3 (Primary) - exam normal today.  Follow up 6 months.     "

## 2023-12-30 NOTE — Telephone Encounter (Signed)
Patient is due for diabetes follow up, this will be required prior to any further refills.  Please schedule, thank you!   

## 2023-12-31 ENCOUNTER — Other Ambulatory Visit (HOSPITAL_COMMUNITY): Payer: Self-pay

## 2023-12-31 NOTE — Telephone Encounter (Signed)
 I called and left a voicemail for pt to be scheduled.

## 2024-01-12 DIAGNOSIS — E1165 Type 2 diabetes mellitus with hyperglycemia: Secondary | ICD-10-CM

## 2024-01-12 DIAGNOSIS — E785 Hyperlipidemia, unspecified: Secondary | ICD-10-CM

## 2024-01-13 NOTE — Addendum Note (Signed)
 Addended by: Loden Laurent K on: 01/13/2024 05:02 PM   Modules accepted: Orders

## 2024-01-23 ENCOUNTER — Other Ambulatory Visit

## 2024-01-24 ENCOUNTER — Other Ambulatory Visit: Payer: Self-pay | Admitting: Primary Care

## 2024-01-24 ENCOUNTER — Other Ambulatory Visit

## 2024-01-24 ENCOUNTER — Ambulatory Visit: Payer: Self-pay | Admitting: Primary Care

## 2024-01-24 DIAGNOSIS — E1165 Type 2 diabetes mellitus with hyperglycemia: Secondary | ICD-10-CM | POA: Diagnosis not present

## 2024-01-24 DIAGNOSIS — E785 Hyperlipidemia, unspecified: Secondary | ICD-10-CM | POA: Diagnosis not present

## 2024-01-24 LAB — COMPREHENSIVE METABOLIC PANEL WITH GFR
ALT: 19 U/L (ref 3–35)
AST: 18 U/L (ref 5–37)
Albumin: 4.4 g/dL (ref 3.5–5.2)
Alkaline Phosphatase: 79 U/L (ref 39–117)
BUN: 21 mg/dL (ref 6–23)
CO2: 28 meq/L (ref 19–32)
Calcium: 9.5 mg/dL (ref 8.4–10.5)
Chloride: 103 meq/L (ref 96–112)
Creatinine, Ser: 0.86 mg/dL (ref 0.40–1.20)
GFR: 70.98 mL/min
Glucose, Bld: 96 mg/dL (ref 70–99)
Potassium: 4.1 meq/L (ref 3.5–5.1)
Sodium: 139 meq/L (ref 135–145)
Total Bilirubin: 0.5 mg/dL (ref 0.2–1.2)
Total Protein: 6.9 g/dL (ref 6.0–8.3)

## 2024-01-24 LAB — HEMOGLOBIN A1C: Hgb A1c MFr Bld: 5.4 % (ref 4.6–6.5)

## 2024-01-24 LAB — LIPID PANEL
Cholesterol: 149 mg/dL (ref 28–200)
HDL: 53.9 mg/dL
LDL Cholesterol: 71 mg/dL (ref 10–99)
NonHDL: 95.15
Total CHOL/HDL Ratio: 3
Triglycerides: 119 mg/dL (ref 10.0–149.0)
VLDL: 23.8 mg/dL (ref 0.0–40.0)

## 2024-01-25 ENCOUNTER — Other Ambulatory Visit (HOSPITAL_COMMUNITY): Payer: Self-pay

## 2024-01-26 MED ORDER — MOUNJARO 10 MG/0.5ML ~~LOC~~ SOAJ
10.0000 mg | SUBCUTANEOUS | 0 refills | Status: AC
  Filled 2024-01-26: qty 2, 28d supply, fill #0

## 2024-01-27 ENCOUNTER — Other Ambulatory Visit (HOSPITAL_COMMUNITY): Payer: Self-pay

## 2024-01-30 ENCOUNTER — Ambulatory Visit: Admitting: Primary Care

## 2024-01-30 VITALS — BP 118/82 | HR 75 | Temp 97.5°F | Ht 62.0 in | Wt 196.0 lb

## 2024-01-30 DIAGNOSIS — E1165 Type 2 diabetes mellitus with hyperglycemia: Secondary | ICD-10-CM | POA: Diagnosis not present

## 2024-01-30 DIAGNOSIS — M25561 Pain in right knee: Secondary | ICD-10-CM | POA: Diagnosis not present

## 2024-01-30 DIAGNOSIS — Z1283 Encounter for screening for malignant neoplasm of skin: Secondary | ICD-10-CM

## 2024-01-30 DIAGNOSIS — Z7984 Long term (current) use of oral hypoglycemic drugs: Secondary | ICD-10-CM

## 2024-01-30 DIAGNOSIS — G8929 Other chronic pain: Secondary | ICD-10-CM | POA: Diagnosis not present

## 2024-01-30 NOTE — Assessment & Plan Note (Signed)
 Well-controlled with A1c of 5.4.  Continue Mounjaro  10 mg weekly.  Follow-up in 6 months

## 2024-01-30 NOTE — Progress Notes (Signed)
 "  Subjective:    Patient ID: Tina Chavez, female    DOB: 02-23-1960, 64 y.o.   MRN: [id number]  Tina Chavez is a very pleasant 64 y.o. female with a history of type 2 diabetes, hyperlipidemia who presents today for follow up of diabetes.  She would also like a referral to sports medicine for knee pain and a dermatology referral for an annual skin check.  1) Type 2 Diabetes:  Current medications include: Mounjaro  10 mg weekly  She is checking her blood glucose 0 times daily.   Last A1C: 5.3 in July 2025, 5.4 today Last Eye Exam: Due Last Foot Exam: UTD Pneumonia Vaccination: Never completed  Urine Microalbumin: UTD Statin: atorvastatin    Dietary changes since last visit: Fair diet, room for improvement. Tougher during the holidays.    Exercise: Less physical activity  2) Chronic Knee Pain: Chronic over the last year and located to the anterior medial side for which is intermittent. She mostly notices her pain when walking up her stairs at home and when sleeping. She has a spiral staircase at home for which she goes up and down at least twice daily. Her pain improves when she walks up her staircase correctly or when on vacation when she's not walking up her staircase. She would like to see sports medicine.    Review of Systems  Respiratory:  Negative for shortness of breath.   Cardiovascular:  Negative for chest pain.  Neurological:  Negative for dizziness and numbness.         Past Medical History:  Diagnosis Date   Abnormal Pap smear of cervix    age 14   Allergy    Cancer (HCC) 05/2020   vulvar carcinoma in situ   Chronic constipation    Depression    Endometriosis    GERD (gastroesophageal reflux disease)    Hemorrhoids    Hyperlipidemia    Obesity    Osteopenia    Pre-diabetes     Social History   Socioeconomic History   Marital status: Married    Spouse name: Dillie Burandt    Number of children: 0   Years of education: Not on file   Highest  education level: Master's degree (e.g., MA, MS, MEng, MEd, MSW, MBA)  Occupational History   Occupation: Social Work   Occupation: Metallurgist  Tobacco Use   Smoking status: Never   Smokeless tobacco: Never  Vaping Use   Vaping status: Never Used  Substance and Sexual Activity   Alcohol use: No   Drug use: No   Sexual activity: Not Currently    Birth control/protection: Post-menopausal  Other Topics Concern   Not on file  Social History Narrative   Married.   Step Children.   Works as a paramedic.   Enjoys traveling, playing with her dogs.    Social Drivers of Health   Tobacco Use: Low Risk (12/30/2023)   Patient History    Smoking Tobacco Use: Never    Smokeless Tobacco Use: Never    Passive Exposure: Not on file  Financial Resource Strain: Low Risk (01/29/2024)   Overall Financial Resource Strain (CARDIA)    Difficulty of Paying Living Expenses: Not hard at all  Food Insecurity: No Food Insecurity (01/29/2024)   Epic    Worried About Radiation Protection Practitioner of Food in the Last Year: Never true    Ran Out of Food in the Last Year: Never true  Transportation Needs: No Transportation Needs (01/29/2024)   Epic  Lack of Transportation (Medical): No    Lack of Transportation (Non-Medical): No  Physical Activity: Inactive (01/29/2024)   Exercise Vital Sign    Days of Exercise per Week: 0 days    Minutes of Exercise per Session: Not on file  Stress: No Stress Concern Present (01/29/2024)   [employer] of Occupational Health - Occupational Stress Questionnaire    Feeling of Stress: Not at all  Social Connections: Moderately Isolated (01/29/2024)   Social Connection and Isolation Panel    Frequency of Communication with Friends and Family: More than three times a week    Frequency of Social Gatherings with Friends and Family: More than three times a week    Attends Religious Services: Never    Database Administrator or Organizations: No    Attends Engineer, Structural:  Not on file    Marital Status: Married  Catering Manager Violence: Not on file  Depression (PHQ2-9): Low Risk (01/30/2024)   Depression (PHQ2-9)    PHQ-2 Score: 0  Alcohol Screen: Low Risk (01/29/2024)   Alcohol Screen    Last Alcohol Screening Score (AUDIT): 0  Housing: Low Risk (01/29/2024)   Epic    Unable to Pay for Housing in the Last Year: No    Number of Times Moved in the Last Year: 0    Homeless in the Last Year: No  Utilities: Not on file  Health Literacy: Not on file    Past Surgical History:  Procedure Laterality Date   ABDOMINOPLASTY  2006   Dr. Angelica   BREAST REDUCTION SURGERY  2006   COLONOSCOPY     normal- but hems in [place]- age 46    COLONOSCOPY  07/07/2018   COLONOSCOPY  10/23/2021   DILATION AND CURETTAGE OF UTERUS  2016   FRACTURE SURGERY  1972   Arm    GYNECOLOGIC CRYOSURGERY     at age 78   HYSTEROSCOPY  2006   REDUCTION MAMMAPLASTY     2006   VULVA /PERINEUM BIOPSY N/A 06/15/2020   Procedure: RE-EXCISION VULVAR BIOPSY-  Wide Local Excision;  Surgeon: Cleotilde Ronal RAMAN, MD;  Location: [facility];  Service: Gynecology;  Laterality: N/A;    Family History  Problem Relation Age of Onset   Hyperlipidemia Mother    Stroke Mother    Diabetes Mother    Hypertension Mother    Kidney disease Mother    Thyroid  disease Mother    Eating disorder Mother    Obesity Mother    Arthritis Father    Heart disease Father    Sudden death Father    Colon polyps Father    Hyperlipidemia Maternal Grandmother    Heart disease Maternal Grandmother    Mental illness Maternal Grandmother    Alcohol abuse Maternal Grandfather    Arthritis Paternal Grandmother    Hyperlipidemia Paternal Grandmother    Heart disease Paternal Grandmother    Alcohol abuse Paternal Grandfather    Colon polyps Sister    Colon cancer Neg Hx    Esophageal cancer Neg Hx    Rectal cancer Neg Hx    Stomach cancer Neg Hx     Allergies[1]  Medications Ordered Prior  to Encounter[2]  BP 118/82   Pulse 75   Temp (!) 97.5 F (36.4 C) (Oral)   Ht 5' 2 (1.575 m)   Wt 196 lb (88.9 kg)   LMP 01/01/2014   SpO2 96%   BMI 35.85 kg/m  Objective:  Physical Exam Cardiovascular:     Rate and Rhythm: Normal rate and regular rhythm.  Pulmonary:     Effort: Pulmonary effort is normal.     Breath sounds: Normal breath sounds.  Musculoskeletal:     Cervical back: Neck supple.  Skin:    General: Skin is warm and dry.  Neurological:     Mental Status: She is alert and oriented to person, place, and time.  Psychiatric:        Mood and Affect: Mood normal.     Physical Exam        Assessment & Plan:  Type 2 diabetes mellitus with hyperglycemia, without long-term current use of insulin  (HCC) Assessment & Plan: Well-controlled with A1c of 5.4.  Continue Mounjaro  10 mg weekly.  Follow-up in 6 months   Chronic pain of right knee Assessment & Plan: Referral placed to sports medicine per patient request  Orders: -     Ambulatory referral to Sports Medicine  Screening for skin cancer -     Ambulatory referral to Dermatology    Assessment and Plan Assessment & Plan         Comer MARLA Gaskins, NP       [1]  Allergies Allergen Reactions   Sulfa Antibiotics Anaphylaxis   Pollen Extract   [2]  Current Outpatient Medications on File Prior to Visit  Medication Sig Dispense Refill   atorvastatin  (LIPITOR) 10 MG tablet Take 1 tablet (10 mg total) by mouth daily. for cholesterol. 90 tablet 3   CALCIUM  PO Take by mouth.     Cholecalciferol (VITAMIN D3) 25 MCG (1000 UT) CAPS Take by mouth daily.     conjugated estrogens  (PREMARIN ) vaginal cream Insert 1/2 gram vaginally twice weekly 30 g 2   diazepam  (VALIUM ) 10 MG tablet Take 1 tablet (10 mg total) by mouth 1 hour prior to dental appointment. Must have driver. 1 tablet 0   glucosamine-chondroitin 500-400 MG tablet Take 1 tablet by mouth 3 (three) times daily.     hydrocortisone   (ANUSOL -HC) 2.5 % rectal cream Place 1 Application rectally 2 (two) times daily. 30 g 1   ibuprofen  (ADVIL ) 800 MG tablet Take 1 tablet (800 mg total) by mouth every 8 (eight) hours as needed. 30 tablet 0   loratadine (CLARITIN) 10 MG tablet Take 10 mg by mouth daily.     Multiple Vitamin (MULTIVITAMIN) tablet Take 1 tablet by mouth daily.     omeprazole (PRILOSEC) 10 MG capsule Take 20 mg by mouth daily.      tirzepatide  (MOUNJARO ) 10 MG/0.5ML Pen Inject 10 mg into the skin once a week. for diabetes. 2 mL 0   triamcinolone  ointment (KENALOG ) 0.5 % Apply 1 Application topically 2 (two) times daily. 30 g 0   No current facility-administered medications on file prior to visit.   "

## 2024-01-30 NOTE — Patient Instructions (Signed)
 You will either be contacted via phone regarding your referral to dermatology and sports medicine, or you may receive a letter on your MyChart portal from our referral team with instructions for scheduling an appointment. Please let us  know if you have not been contacted by anyone within two weeks.  Schedule your welcome to Medicare visit for 6 months.  It was a pleasure to see you today!

## 2024-01-30 NOTE — Assessment & Plan Note (Signed)
 Referral placed to sports medicine per patient request

## 2024-02-07 NOTE — Progress Notes (Unsigned)
"             ° °   Ben Jackson D.[facility] [address] [place] [address] Phone: [phone]   Assessment and Plan:     ***    Pertinent previous records reviewed include ***   Follow Up: ***     Subjective:   I, Chestine Reeves, am serving as a neurosurgeon for Doctor Morene Mace  Chief Complaint: right knee pain   HPI:   02/10/2024 Patient is a 64 year old female with right knee pain. Patient states   Relevant Historical Information: ***  Additional pertinent review of systems negative.  Current Medications[1]   Objective:     There were no vitals filed for this visit.    There is no height or weight on file to calculate BMI.    Physical Exam:    ***   Electronically signed by:  Odis Mace D.[facility] 7:21 AM 02/07/24    [1]  Current Outpatient Medications:    atorvastatin  (LIPITOR) 10 MG tablet, Take 1 tablet (10 mg total) by mouth daily. for cholesterol., Disp: 90 tablet, Rfl: 3   CALCIUM  PO, Take by mouth., Disp: , Rfl:    Cholecalciferol (VITAMIN D3) 25 MCG (1000 UT) CAPS, Take by mouth daily., Disp: , Rfl:    conjugated estrogens  (PREMARIN ) vaginal cream, Insert 1/2 gram vaginally twice weekly, Disp: 30 g, Rfl: 2   diazepam  (VALIUM ) 10 MG tablet, Take 1 tablet (10 mg total) by mouth 1 hour prior to dental appointment. Must have driver., Disp: 1 tablet, Rfl: 0   glucosamine-chondroitin 500-400 MG tablet, Take 1 tablet by mouth 3 (three) times daily., Disp: , Rfl:    hydrocortisone  (ANUSOL -HC) 2.5 % rectal cream, Place 1 Application rectally 2 (two) times daily., Disp: 30 g, Rfl: 1   ibuprofen  (ADVIL ) 800 MG tablet, Take 1 tablet (800 mg total) by mouth every 8 (eight) hours as needed., Disp: 30 tablet, Rfl: 0   loratadine (CLARITIN) 10 MG tablet, Take 10 mg by mouth daily., Disp: , Rfl:    Multiple Vitamin (MULTIVITAMIN) tablet, Take 1 tablet by mouth daily., Disp: , Rfl:    omeprazole (PRILOSEC) 10  MG capsule, Take 20 mg by mouth daily. , Disp: , Rfl:    tirzepatide  (MOUNJARO ) 10 MG/0.5ML Pen, Inject 10 mg into the skin once a week. for diabetes., Disp: 2 mL, Rfl: 0   triamcinolone  ointment (KENALOG ) 0.5 %, Apply 1 Application topically 2 (two) times daily., Disp: 30 g, Rfl: 0  "

## 2024-02-10 ENCOUNTER — Ambulatory Visit: Admitting: Sports Medicine

## 2024-06-29 ENCOUNTER — Ambulatory Visit (HOSPITAL_BASED_OUTPATIENT_CLINIC_OR_DEPARTMENT_OTHER): Payer: Self-pay | Admitting: Obstetrics & Gynecology

## 2024-11-02 ENCOUNTER — Ambulatory Visit: Admitting: Physician Assistant

## 2024-12-03 ENCOUNTER — Ambulatory Visit (HOSPITAL_BASED_OUTPATIENT_CLINIC_OR_DEPARTMENT_OTHER): Payer: No Typology Code available for payment source | Admitting: Obstetrics & Gynecology
